# Patient Record
Sex: Male | Born: 1970 | Race: Black or African American | Hispanic: No | Marital: Married | Smoking: Former smoker
Health system: Southern US, Community
[De-identification: ages and names within clinical notes are randomized; demographics above are authoritative.]

## PROBLEM LIST (undated history)

## (undated) DIAGNOSIS — K589 Irritable bowel syndrome without diarrhea: Secondary | ICD-10-CM

## (undated) DIAGNOSIS — F419 Anxiety disorder, unspecified: Secondary | ICD-10-CM

## (undated) DIAGNOSIS — K760 Fatty (change of) liver, not elsewhere classified: Secondary | ICD-10-CM

## (undated) DIAGNOSIS — K8020 Calculus of gallbladder without cholecystitis without obstruction: Secondary | ICD-10-CM

## (undated) HISTORY — DX: Irritable bowel syndrome, unspecified: K58.9

## (undated) HISTORY — DX: Anxiety disorder, unspecified: F41.9

## (undated) HISTORY — DX: Fatty (change of) liver, not elsewhere classified: K76.0

## (undated) HISTORY — PX: OTHER SURGICAL HISTORY: SHX169

## (undated) HISTORY — DX: Calculus of gallbladder without cholecystitis without obstruction: K80.20

---

## 2000-06-21 ENCOUNTER — Emergency Department (HOSPITAL_COMMUNITY): Admission: EM | Admit: 2000-06-21 | Discharge: 2000-06-21 | Payer: Self-pay | Admitting: *Deleted

## 2001-01-09 ENCOUNTER — Emergency Department (HOSPITAL_COMMUNITY): Admission: EM | Admit: 2001-01-09 | Discharge: 2001-01-09 | Payer: Self-pay | Admitting: *Deleted

## 2002-03-02 ENCOUNTER — Emergency Department (HOSPITAL_COMMUNITY): Admission: EM | Admit: 2002-03-02 | Discharge: 2002-03-02 | Payer: Self-pay | Admitting: Emergency Medicine

## 2002-03-02 ENCOUNTER — Encounter: Payer: Self-pay | Admitting: Emergency Medicine

## 2002-03-08 ENCOUNTER — Emergency Department (HOSPITAL_COMMUNITY): Admission: EM | Admit: 2002-03-08 | Discharge: 2002-03-08 | Payer: Self-pay | Admitting: Emergency Medicine

## 2002-03-22 ENCOUNTER — Emergency Department (HOSPITAL_COMMUNITY): Admission: EM | Admit: 2002-03-22 | Discharge: 2002-03-22 | Payer: Self-pay | Admitting: Emergency Medicine

## 2002-04-06 ENCOUNTER — Emergency Department (HOSPITAL_COMMUNITY): Admission: EM | Admit: 2002-04-06 | Discharge: 2002-04-06 | Payer: Self-pay

## 2004-06-23 ENCOUNTER — Emergency Department (HOSPITAL_COMMUNITY): Admission: EM | Admit: 2004-06-23 | Discharge: 2004-06-23 | Payer: Self-pay | Admitting: Emergency Medicine

## 2006-06-11 ENCOUNTER — Emergency Department (HOSPITAL_COMMUNITY): Admission: EM | Admit: 2006-06-11 | Discharge: 2006-06-11 | Payer: Self-pay | Admitting: Emergency Medicine

## 2006-06-16 ENCOUNTER — Encounter: Admission: RE | Admit: 2006-06-16 | Discharge: 2006-06-16 | Payer: Self-pay | Admitting: Emergency Medicine

## 2006-08-21 ENCOUNTER — Emergency Department (HOSPITAL_COMMUNITY): Admission: EM | Admit: 2006-08-21 | Discharge: 2006-08-21 | Payer: Self-pay | Admitting: Emergency Medicine

## 2006-12-28 ENCOUNTER — Emergency Department (HOSPITAL_COMMUNITY): Admission: EM | Admit: 2006-12-28 | Discharge: 2006-12-28 | Payer: Self-pay | Admitting: Emergency Medicine

## 2007-01-24 ENCOUNTER — Emergency Department (HOSPITAL_COMMUNITY): Admission: EM | Admit: 2007-01-24 | Discharge: 2007-01-25 | Payer: Self-pay | Admitting: General Surgery

## 2008-07-17 ENCOUNTER — Emergency Department (HOSPITAL_COMMUNITY): Admission: EM | Admit: 2008-07-17 | Discharge: 2008-07-17 | Payer: Self-pay | Admitting: Emergency Medicine

## 2010-06-01 ENCOUNTER — Ambulatory Visit: Payer: Self-pay | Admitting: Rehabilitative and Restorative Service Providers"

## 2010-07-28 LAB — BASIC METABOLIC PANEL
BUN: 6 mg/dL (ref 6–23)
CO2: 29 mEq/L (ref 19–32)
Calcium: 9.5 mg/dL (ref 8.4–10.5)
Chloride: 103 mEq/L (ref 96–112)
Creatinine, Ser: 1.31 mg/dL (ref 0.4–1.5)
GFR calc Af Amer: >60 mL/min (ref >60)
GFR calc non Af Amer: >60 mL/min (ref >60)
Glucose, Bld: 93 mg/dL (ref 70–99)
Potassium: 4 mEq/L (ref 3.5–5.1)
Sodium: 139 mEq/L (ref 135–145)

## 2010-07-28 LAB — DIFFERENTIAL
Basophils Absolute: 0.1 10*3/uL (ref 0.0–0.1)
Basophils Relative: 1 % (ref 0–1)
Eosinophils Absolute: 0.2 10*3/uL (ref 0.0–0.7)
Eosinophils Relative: 2 % (ref 0–5)
Lymphocytes Relative: 17 % (ref 12–46)
Lymphs Abs: 1.4 10*3/uL (ref 0.7–4.0)
Monocytes Absolute: 0.4 10*3/uL (ref 0.1–1.0)
Monocytes Relative: 6 % (ref 3–12)
Neutro Abs: 5.9 10*3/uL (ref 1.7–7.7)
Neutrophils Relative %: 75 % (ref 43–77)

## 2010-07-28 LAB — CBC
HCT: 42.8 % (ref 39.0–52.0)
Hemoglobin: 14.5 g/dL (ref 13.0–17.0)
MCHC: 33.9 g/dL (ref 30.0–36.0)
MCV: 92.9 fL (ref 78.0–100.0)
Platelets: 179 10*3/uL (ref 150–400)
RBC: 4.61 MIL/uL (ref 4.22–5.81)
RDW: 13.1 % (ref 11.5–15.5)
WBC: 8 10*3/uL (ref 4.0–10.5)

## 2010-07-28 LAB — POCT CARDIAC MARKERS
CKMB, poc: <1 ng/mL — ABNORMAL LOW (ref 1.0–8.0)
Myoglobin, poc: 113 ng/mL (ref 12–200)
Troponin i, poc: <0.05 ng/mL (ref 0.00–0.09)

## 2010-08-28 ENCOUNTER — Inpatient Hospital Stay (INDEPENDENT_AMBULATORY_CARE_PROVIDER_SITE_OTHER)
Admission: RE | Admit: 2010-08-28 | Discharge: 2010-08-28 | Disposition: A | Discharge status: Home / Self Care | Payer: 59 | Source: Ambulatory Visit | Attending: Emergency Medicine | Admitting: Emergency Medicine

## 2010-08-28 DIAGNOSIS — L259 Unspecified contact dermatitis, unspecified cause: Secondary | ICD-10-CM

## 2011-01-27 LAB — I-STAT 8, (EC8 V) (CONVERTED LAB)
Acid-Base Excess: 3 — ABNORMAL HIGH
BUN: 8
Bicarbonate: 28.6 — ABNORMAL HIGH
Chloride: 102
Glucose, Bld: 85
HCT: 42
Hemoglobin: 14.3
Operator id: 279831
Potassium: 3.5
Sodium: 140
TCO2: 30
pCO2, Ven: 48.9
pH, Ven: 7.376 — ABNORMAL HIGH

## 2011-01-27 LAB — POCT CARDIAC MARKERS
CKMB, poc: <1 — ABNORMAL LOW
Myoglobin, poc: 148
Operator id: 279831
Troponin i, poc: <0.05

## 2011-01-27 LAB — POCT I-STAT CREATININE
Creatinine, Ser: 1.4
Operator id: 279831

## 2011-01-28 LAB — I-STAT 8, (EC8 V) (CONVERTED LAB)
Acid-Base Excess: 3 — ABNORMAL HIGH
BUN: 5 — ABNORMAL LOW
Bicarbonate: 29.4 — ABNORMAL HIGH
Chloride: 101
Glucose, Bld: 99
HCT: 47
Hemoglobin: 16
Operator id: 146091
Potassium: 3.3 — ABNORMAL LOW
Sodium: 139
TCO2: 31
pCO2, Ven: 51.8 — ABNORMAL HIGH
pH, Ven: 7.361 — ABNORMAL HIGH

## 2011-01-28 LAB — POCT CARDIAC MARKERS
CKMB, poc: <1 — ABNORMAL LOW
Myoglobin, poc: 97.5
Operator id: 146091
Troponin i, poc: <0.05

## 2011-01-28 LAB — RAPID URINE DRUG SCREEN, HOSP PERFORMED
Amphetamines: NOT DETECTED
Barbiturates: NOT DETECTED
Benzodiazepines: NOT DETECTED
Cocaine: NOT DETECTED
Opiates: NOT DETECTED
Tetrahydrocannabinol: NOT DETECTED

## 2011-01-28 LAB — POCT I-STAT CREATININE
Creatinine, Ser: 1.4
Operator id: 146091

## 2011-08-13 ENCOUNTER — Ambulatory Visit (INDEPENDENT_AMBULATORY_CARE_PROVIDER_SITE_OTHER): Payer: 59 | Admitting: Family Medicine

## 2011-08-13 VITALS — BP 116/74 | HR 81 | Temp 98.0°F | Resp 16 | Ht 73.0 in | Wt 214.4 lb

## 2011-08-13 DIAGNOSIS — R5381 Other malaise: Secondary | ICD-10-CM

## 2011-08-13 DIAGNOSIS — R0989 Other specified symptoms and signs involving the circulatory and respiratory systems: Secondary | ICD-10-CM

## 2011-08-13 DIAGNOSIS — F419 Anxiety disorder, unspecified: Secondary | ICD-10-CM

## 2011-08-13 DIAGNOSIS — F411 Generalized anxiety disorder: Secondary | ICD-10-CM

## 2011-08-13 DIAGNOSIS — R0609 Other forms of dyspnea: Secondary | ICD-10-CM

## 2011-08-13 DIAGNOSIS — R5383 Other fatigue: Secondary | ICD-10-CM

## 2011-08-13 DIAGNOSIS — R0683 Snoring: Secondary | ICD-10-CM

## 2011-08-13 NOTE — Progress Notes (Signed)
[facility]:  Office Visit  Chief Complaint:  Chief Complaint  Patient presents with  . Rx refill  . Body/joint aches    x on/off 2 wks    HPI: Micheal Roberts is a 41 y.o. male who complains of  Generalized fatigue and body aches, jt pain. ? RA in family. 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 Poor sleep hygeine, sleeps about 3-4 hrs after coming home from work. Snores at night. Tried ibuprofen with some relief for msk pain.   Past Medical History  Diagnosis Date  . IBS (irritable bowel syndrome)    Past Surgical History  Procedure Date  . Left acl arthroscopy    History   Social History  . Marital Status: Single    Spouse Name: N/A    Number of Children: N/A  . Years of Education: N/A   Social History Main Topics  . Smoking status: Current Everyday Smoker -- 0.5 packs/day for 10 years    Types: Cigarettes  . Smokeless tobacco: None  . Alcohol Use: Yes     beer - only on ocassions  . Drug Use: No  . Sexually Active: Yes   Other Topics Concern  . None   Social History Narrative  . None   Family History  Problem Relation Age of Onset  . Hypertension Mother   . Anemia Mother   . Thyroid disease Father    No Known Allergies Prior to Admission medications   Medication Sig Start Date End Date Taking? Authorizing Provider  clonazePAM (KLONOPIN) 0.5 MG tablet Take 0.5 mg by mouth 2 (two) times daily as needed.   Yes Historical Provider, MD     ROS: The patient denies fevers, chills, night sweats, unintentional weight loss, chest pain, palpitations, wheezing, dyspnea on exertion, nausea, vomiting, abdominal pain, dysuria, hematuria, melena, numbness, or tingling. + joint and msk pain  All other systems have been reviewed and were otherwise negative with the exception of those mentioned in the HPI and as above.    PHYSICAL EXAM: Filed Vitals:   08/13/11 1518  BP: 116/74  Pulse: 81  Temp: 98 F (36.7 C)  Resp: 16   Filed Vitals:    08/13/11 1518  Height: 6\' 1"  (1.854 m)  Weight: 214 lb 6.4 oz (97.251 kg)   Body mass index is 28.29 kg/(m^2).  General: Alert, no acute distress HEENT:  Normocephalic, atraumatic, oropharynx patent.  Cardiovascular:  Regular rate and rhythm, no rubs murmurs or gallops.  No Carotid bruits, radial pulse intact. No pedal edema.  Respiratory: Clear to auscultation bilaterally.  No wheezes, rales, or rhonchi.  No cyanosis, no use of accessory musculature GI: No organomegaly, abdomen is soft and non-tender, positive bowel sounds.  No masses. Skin: No rashes. Neurologic: Facial musculature symmetric. Psychiatric: Patient is appropriate throughout our interaction. Lymphatic: No cervical lymphadenopathy Musculoskeletal: Gait intact.   LABS:    EKG/XRAY:   Primary read interpreted by Dr. Conley Rolls at [facility].   ASSESSMENT/PLAN: Encounter Diagnoses  Name Primary?  . Fatigue Yes  . Anxiety   . Snoring    Very vague office visit. Marland Kitchen He does seem to get relief for his jt and msk aches and pain with motrin. Since he has a? History of RA in the family I will get a RF. Additionally TSH for fatigue. I think it is primarily secondary to poor sleep hygiene. Patient is not able to go or maintain sleep and ? Poor sleep hygeine, anxiety or something  else likse sleep apnea. He does have + snoring, depression/anxiety sxs, diurnal fatigue, etc.   1. RF, TSH and sleep study referral.  2. Refilled Clonazepam 0.5 mg daily #30, NO refills.  3. F/u with Dr Milus Glazier for annual.    [facility]al [facility], DO 08/13/2011 4:14 PM

## 2011-08-14 LAB — RHEUMATOID FACTOR: Rheumatoid fact SerPl-aCnc: <10 [IU]/mL (ref <=14)

## 2011-08-14 LAB — TSH: TSH: 1.084 u[IU]/mL (ref 0.350–4.500)

## 2011-08-22 ENCOUNTER — Encounter: Payer: Self-pay | Admitting: Family Medicine

## 2011-09-12 ENCOUNTER — Ambulatory Visit (HOSPITAL_BASED_OUTPATIENT_CLINIC_OR_DEPARTMENT_OTHER): Payer: 59

## 2011-09-13 ENCOUNTER — Other Ambulatory Visit: Payer: Self-pay | Admitting: Family Medicine

## 2011-09-14 ENCOUNTER — Ambulatory Visit (INDEPENDENT_AMBULATORY_CARE_PROVIDER_SITE_OTHER): Payer: 59 | Admitting: Family Medicine

## 2011-09-14 VITALS — BP 127/76 | HR 82 | Temp 98.8°F | Resp 20 | Ht 73.0 in | Wt 212.0 lb

## 2011-09-14 DIAGNOSIS — F411 Generalized anxiety disorder: Secondary | ICD-10-CM

## 2011-09-14 DIAGNOSIS — F419 Anxiety disorder, unspecified: Secondary | ICD-10-CM

## 2011-09-14 MED ORDER — BUSPIRONE HCL 15 MG PO TABS: 7.5000 mg | ORAL_TABLET | Freq: Two times a day (BID) | ORAL | Status: AC

## 2011-09-14 MED ORDER — CLONAZEPAM 0.5 MG PO TABS: 0.5000 mg | ORAL_TABLET | Freq: Every day | ORAL | Status: DC | PRN

## 2011-09-14 NOTE — Patient Instructions (Signed)

## 2011-09-14 NOTE — Progress Notes (Signed)
  Subjective:    Patient ID: Micheal Roberts, male    DOB: June 11, 1970, 41 y.o.   MRN: [id number]  HPI Pt here for follow up on anxiety.  Currently on klonopin 0.5 1 tab daily Has tried multiple SSRIs in the past including:  Prozac, zoloft, celexa, lexapro, wellbutrin Multiple intolerances--> did not make pt feel right, side effects intolerable.  Has not tried buspar before.  Is willing to try this medication.  No HI/SI.    Review of Systems See HPI, otherwise ROS negative.    Objective:   Physical Exam Gen: up in chair, NAD HEENT: NCAT, EOMI, TMs clear bilaterally CV: RRR, no murmurs auscultated PULM: CTAB, no wheezes, rales, rhoncii ABD: S/NT/+ bowel sounds  EXT: 2+ peripheral pulses   Assessment & Plan:   Will start on trial of buspar. Continue klonopin. Follow up in 1 month. Psych red flags reviewed.

## 2011-10-19 ENCOUNTER — Ambulatory Visit (INDEPENDENT_AMBULATORY_CARE_PROVIDER_SITE_OTHER): Payer: 59 | Admitting: Family Medicine

## 2011-10-19 VITALS — BP 104/76 | HR 69 | Temp 98.4°F | Resp 16 | Ht 73.0 in | Wt 211.4 lb

## 2011-10-19 DIAGNOSIS — M79659 Pain in unspecified thigh: Secondary | ICD-10-CM

## 2011-10-19 DIAGNOSIS — F411 Generalized anxiety disorder: Secondary | ICD-10-CM

## 2011-10-19 DIAGNOSIS — M79609 Pain in unspecified limb: Secondary | ICD-10-CM

## 2011-10-19 MED ORDER — PREDNISONE 20 MG PO TABS: ORAL_TABLET | ORAL | Status: DC

## 2011-10-19 MED ORDER — CLONAZEPAM 0.5 MG PO TABS: 0.5000 mg | ORAL_TABLET | Freq: Two times a day (BID) | ORAL | Status: DC | PRN

## 2011-10-19 NOTE — Progress Notes (Addendum)
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 and developed right thigh pain 3 days ago.  It remains sore in the mid anterior thigh.  No recent change in activity or trauma.  He also has some low back pain and has had similar mild pain in the past in each of the thighs.  No weakness or loss of feeling  O:  NAD FROM Neg SLR but pain is reproduced when doing a femoral stretch  A:  Suspect a mild L3 disc swelling with nerve root irritation  P:  Prednisone 40 mg daily for 5 days. 1. Thigh pain  predniSONE (DELTASONE) 20 MG tablet   Also needs refill on the clonazepam. Buspar does not work for him  Plan:  Refill the clonazepam. 0.5 bid

## 2011-10-19 NOTE — Addendum Note (Signed)
Addended by: Elvina Sidle on: 10/19/2011 03:21 PM   Modules accepted: Orders, Level of Service

## 2012-05-05 ENCOUNTER — Ambulatory Visit (INDEPENDENT_AMBULATORY_CARE_PROVIDER_SITE_OTHER): Payer: 59 | Admitting: Family Medicine

## 2012-05-05 VITALS — BP 119/77 | HR 75 | Temp 98.3°F | Resp 12 | Ht 75.0 in | Wt 217.0 lb

## 2012-05-05 DIAGNOSIS — F419 Anxiety disorder, unspecified: Secondary | ICD-10-CM

## 2012-05-05 DIAGNOSIS — F411 Generalized anxiety disorder: Secondary | ICD-10-CM

## 2012-05-05 DIAGNOSIS — G4700 Insomnia, unspecified: Secondary | ICD-10-CM

## 2012-05-05 MED ORDER — CLONAZEPAM 0.5 MG PO TABS: 0.5000 mg | ORAL_TABLET | Freq: Two times a day (BID) | ORAL | Status: DC | PRN

## 2012-05-05 NOTE — Patient Instructions (Signed)

## 2012-05-05 NOTE — Progress Notes (Signed)
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 He's here for refill on his Klonopin. He reports it is not sleeping well. Nevertheless his anxiety is being controlled. He reports no unnecessary somnolence.  Objective: HEENT: Unremarkable No acute distress Chest: Clear Heart: Regular no murmur Extremities: Unremarkable  Assessment: Patient with chronic anxiety who seems to be doing reasonably well and dysfunctional. Am reluctant to give him more medicine to take at night for sleep for fear of drug interactions   1. Anxiety  clonazePAM (KLONOPIN) 0.5 MG tablet  2. Insomnia

## 2012-09-14 ENCOUNTER — Ambulatory Visit (INDEPENDENT_AMBULATORY_CARE_PROVIDER_SITE_OTHER): Payer: 59 | Admitting: Emergency Medicine

## 2012-09-14 ENCOUNTER — Ambulatory Visit: Payer: 59

## 2012-09-14 VITALS — BP 104/58 | HR 72 | Temp 98.3°F | Resp 16 | Ht 74.0 in | Wt 221.0 lb

## 2012-09-14 DIAGNOSIS — M25569 Pain in unspecified knee: Secondary | ICD-10-CM

## 2012-09-14 DIAGNOSIS — M25562 Pain in left knee: Secondary | ICD-10-CM

## 2012-09-14 DIAGNOSIS — M109 Gout, unspecified: Secondary | ICD-10-CM

## 2012-09-14 MED ORDER — COLCHICINE 0.6 MG PO TABS: ORAL_TABLET | ORAL | Status: DC

## 2012-09-14 MED ORDER — INDOMETHACIN 25 MG PO CAPS: 25.0000 mg | ORAL_CAPSULE | Freq: Two times a day (BID) | ORAL | Status: DC

## 2012-09-14 NOTE — Patient Instructions (Addendum)
Gout  Gout is an inflammatory condition (arthritis) caused by a buildup of uric acid crystals in the joints. Uric acid is a chemical that is normally present in the blood. Under some circumstances, uric acid can form into crystals in your joints. This causes joint redness, soreness, and swelling (inflammation). Repeat attacks are common. Over time, uric acid crystals can form into masses (tophi) near a joint, causing disfigurement. Gout is treatable and often preventable.  CAUSES   The disease begins with elevated levels of uric acid in the blood. Uric acid is produced by your body when it breaks down a naturally found substance called purines. This also happens when you eat certain foods such as meats and fish. Causes of an elevated uric acid level include:   Being passed down from parent to child (heredity).   Diseases that cause increased uric acid production (obesity, psoriasis, some cancers).   Excessive alcohol use.   Diet, especially diets rich in meat and seafood.   Medicines, including certain cancer-fighting drugs (chemotherapy), diuretics, and aspirin.   Chronic kidney disease. The kidneys are no longer able to remove uric acid well.   Problems with metabolism.  Conditions strongly associated with gout include:   Obesity.   High blood pressure.   High cholesterol.   Diabetes.  Not everyone with elevated uric acid levels gets gout. It is not understood why some people get gout and others do not. Surgery, joint injury, and eating too much of certain foods are some of the factors that can lead to gout.  SYMPTOMS    An attack of gout comes on quickly. It causes intense pain with redness, swelling, and warmth in a joint.   Fever can occur.   Often, only one joint is involved. Certain joints are more commonly involved:   Base of the big toe.   Knee.   Ankle.   Wrist.   Finger.  Without treatment, an attack usually goes away in a few days to weeks. Between attacks, you usually will not have  symptoms, which is different from many other forms of arthritis.  DIAGNOSIS   Your caregiver will suspect gout based on your symptoms and exam. Removal of fluid from the joint (arthrocentesis) is done to check for uric acid crystals. Your caregiver will give you a medicine that numbs the area (local anesthetic) and use a needle to remove joint fluid for exam. Gout is confirmed when uric acid crystals are seen in joint fluid, using a special microscope. Sometimes, blood, urine, and X-ray tests are also used.  TREATMENT   There are 2 phases to gout treatment: treating the sudden onset (acute) attack and preventing attacks (prophylaxis).  Treatment of an Acute Attack   Medicines are used. These include anti-inflammatory medicines or steroid medicines.   An injection of steroid medicine into the affected joint is sometimes necessary.   The painful joint is rested. Movement can worsen the arthritis.   You may use warm or cold treatments on painful joints, depending which works best for you.   Discuss the use of coffee, vitamin C, or cherries with your caregiver. These may be helpful treatment options.  Treatment to Prevent Attacks  After the acute attack subsides, your caregiver may advise prophylactic medicine. These medicines either help your kidneys eliminate uric acid from your body or decrease your uric acid production. You may need to stay on these medicines for a very long time.  The early phase of treatment with prophylactic medicine can be associated   with an increase in acute gout attacks. For this reason, during the first few months of treatment, your caregiver may also advise you to take medicines usually used for acute gout treatment. Be sure you understand your caregiver's directions.  You should also discuss dietary treatment with your caregiver. Certain foods such as meats and fish can increase uric acid levels. Other foods such as dairy can decrease levels. Your caregiver can give you a list of foods  to avoid.  HOME CARE INSTRUCTIONS    Do not take aspirin to relieve pain. This raises uric acid levels.   Only take over-the-counter or prescription medicines for pain, discomfort, or fever as directed by your caregiver.   Rest the joint as much as possible. When in bed, keep sheets and blankets off painful areas.   Keep the affected joint raised (elevated).   Use crutches if the painful joint is in your leg.   Drink enough water and fluids to keep your urine clear or pale yellow. This helps your body get rid of uric acid. Do not drink alcoholic beverages. They slow the passage of uric acid.   Follow your caregiver's dietary instructions. Pay careful attention to the amount of protein you eat. Your daily diet should emphasize fruits, vegetables, whole grains, and fat-free or low-fat milk products.   Maintain a healthy body weight.  SEEK MEDICAL CARE IF:    You have an oral temperature above 102 F (38.9 C).   You develop diarrhea, vomiting, or any side effects from medicines.   You do not feel better in 24 hours, or you are getting worse.  SEEK IMMEDIATE MEDICAL CARE IF:    Your joint becomes suddenly more tender and you have:   Chills.   An oral temperature above 102 F (38.9 C), not controlled by medicine.  MAKE SURE YOU:    Understand these instructions.   Will watch your condition.   Will get help right away if you are not doing well or get worse.  Document Released: 04/01/2000 Document Revised: 06/27/2011 Document Reviewed: 07/13/2009  Exit[employer] Patient Information 2014 [employer].

## 2012-09-14 NOTE — Progress Notes (Signed)
[facility] [address] [place] [address] [phone]  Date:  09/14/2012   Name:  Micheal Roberts   DOB:  Nov 20, 1970   MRN:  [id number]  PCP:  Tally Due, MD    Chief Complaint: Knee Pain   History of Present Illness:  Micheal Roberts is a 42 y.o. very pleasant male patient who presents with the following:  No history of injury.  Has pain in left knee for past two weeks.  Swollen and painful with worse pain with ambulation.  No fever or chills.  No antecedent injury or illness.  Injured playing sports while in high school.  No improvement with over the counter medications or other home remedies. Denies other complaint or health concern today.   There are no active problems to display for this patient.   Past Medical History  Diagnosis Date  . IBS (irritable bowel syndrome)   . Anxiety     Past Surgical History  Procedure Laterality Date  . Left acl arthroscopy      History  Substance Use Topics  . Smoking status: Current Every Day Smoker -- 0.50 packs/day for 10 years    Types: Cigarettes  . Smokeless tobacco: Not on file  . Alcohol Use: Yes     Comment: beer - only on ocassions    Family History  Problem Relation Age of Onset  . Hypertension Mother   . Anemia Mother   . Thyroid disease Father     No Known Allergies  Medication list has been reviewed and updated.  Current Outpatient Prescriptions on File Prior to Visit  Medication Sig Dispense Refill  . clonazePAM (KLONOPIN) 0.5 MG tablet Take 1 tablet (0.5 mg total) by mouth 2 (two) times daily as needed for anxiety. Need office visit for additional refills.  60 tablet  5  . Multiple Vitamin (MULTIVITAMIN) tablet Take 1 tablet by mouth daily.       No current facility-administered medications on file prior to visit.    Review of Systems:  As per HPI, otherwise negative.    Physical Examination: Filed Vitals:   09/14/12 1304  BP: 104/58  Pulse: 72  Temp:  98.3 F (36.8 C)  Resp: 16   Filed Vitals:   09/14/12 1304  Height: 6\' 2"  (1.88 m)  Weight: 221 lb (100.245 kg)   Body mass index is 28.36 kg/(m^2). Ideal Body Weight: Weight in (lb) to have BMI = 25: 194.3   GEN: WDWN, NAD, Non-toxic, Alert & Oriented x 3 HEENT: Atraumatic, Normocephalic.  Ears and Nose: No external deformity. EXTR: No clubbing/cyanosis/edema NEURO: Normal gait.  PSYCH: Normally interactive. Conversant. Not depressed or anxious appearing.  Calm demeanor.  LEFT KNEE:  Swollen and tender with FAROM.  No crepitus, clicks or locking.  Assessment and Plan: Gout arthritis Indocin colcrys  Follow up as needed  Signed,  Phillips Odor, MD   [facility] reading (PRIMARY) by  Dr. Dareen Piano  Negative knee.

## 2012-09-15 NOTE — Progress Notes (Signed)
Reviewed and agree.

## 2012-11-11 ENCOUNTER — Other Ambulatory Visit: Payer: Self-pay | Admitting: Family Medicine

## 2012-11-12 NOTE — Telephone Encounter (Signed)
Needs OV for more Klonopin - please let pt know about our controlled substance policy.

## 2012-11-12 NOTE — Telephone Encounter (Signed)
Dr L, do you want to RF this or see pt back first?

## 2012-11-13 NOTE — Telephone Encounter (Signed)
LMOM w/details concerning controlled subst policy.

## 2012-12-24 ENCOUNTER — Ambulatory Visit: Payer: 59

## 2012-12-24 ENCOUNTER — Telehealth: Payer: Self-pay | Admitting: Radiology

## 2012-12-24 NOTE — Telephone Encounter (Signed)
Ok to refill clonazepam x 1 month

## 2012-12-24 NOTE — Telephone Encounter (Signed)
Patient came in to be seen but was having trouble with his insurance, company states it is termed. Patient wants to know if you will refill his meds (klonopin) until he can get his insurance situation figured out. Please advise.

## 2012-12-25 ENCOUNTER — Other Ambulatory Visit: Payer: Self-pay

## 2012-12-25 DIAGNOSIS — F419 Anxiety disorder, unspecified: Secondary | ICD-10-CM

## 2012-12-25 MED ORDER — CLONAZEPAM 0.5 MG PO TABS: 0.5000 mg | ORAL_TABLET | Freq: Two times a day (BID) | ORAL | Status: DC | PRN

## 2012-12-25 NOTE — Telephone Encounter (Signed)
Called in RX to [facility]. Called pt to let him know. Pt understood.

## 2013-03-11 ENCOUNTER — Telehealth: Payer: Self-pay

## 2013-03-11 NOTE — Telephone Encounter (Signed)
Patient needs follow up for refills, called to advise.

## 2013-03-11 NOTE — Telephone Encounter (Signed)
Patient would like a refill on his clonazepam please call patient at [phone]

## 2013-06-04 ENCOUNTER — Ambulatory Visit (INDEPENDENT_AMBULATORY_CARE_PROVIDER_SITE_OTHER): Payer: BC Managed Care – PPO | Admitting: Family Medicine

## 2013-06-04 ENCOUNTER — Encounter: Payer: Self-pay | Admitting: Family Medicine

## 2013-06-04 VITALS — BP 154/97 | HR 70 | Temp 98.2°F | Resp 16 | Ht 72.5 in | Wt 207.0 lb

## 2013-06-04 DIAGNOSIS — E049 Nontoxic goiter, unspecified: Secondary | ICD-10-CM

## 2013-06-04 DIAGNOSIS — M542 Cervicalgia: Secondary | ICD-10-CM

## 2013-06-04 DIAGNOSIS — Z8349 Family history of other endocrine, nutritional and metabolic diseases: Secondary | ICD-10-CM

## 2013-06-04 DIAGNOSIS — F419 Anxiety disorder, unspecified: Secondary | ICD-10-CM

## 2013-06-04 DIAGNOSIS — E010 Iodine-deficiency related diffuse (endemic) goiter: Secondary | ICD-10-CM

## 2013-06-04 DIAGNOSIS — F411 Generalized anxiety disorder: Secondary | ICD-10-CM

## 2013-06-04 DIAGNOSIS — R002 Palpitations: Secondary | ICD-10-CM

## 2013-06-04 MED ORDER — CLONAZEPAM 0.5 MG PO TABS: 0.5000 mg | ORAL_TABLET | Freq: Two times a day (BID) | ORAL | Status: DC | PRN

## 2013-06-04 NOTE — Progress Notes (Addendum)
Subjective:    Patient ID: Micheal Roberts, male    DOB: June 25, 1970, 43 y.o.   MRN: [id number] This chart was scribed for Merri Ray, MD by Anastasia Pall, ED Scribe. This patient was seen in room 13 and the patient's care was started at 4:24 PM.  Authored by Janeann Forehand, MD - unable to change in [facility]   Chief Complaint  Patient presents with   Neck Pain    since last year   Anxiety    needs refills on Klonopin 0.5   Spasms    x 1 week   HPI Micheal Roberts is a 43 y.o. male Here for neck pain, anxiety and muscle spasms with refills needed for Klonopin 0.5. This was prescribed by Dr. Joseph Art 05/07/12 with # 60 and 5 refills. Per phone note 12/24/12 Klonopin was written for 1 month. Then again phon note 11/14 advised to follow up for refills. Per last office visit trouble sleeping. No change in doses of medicines at that time.   He states his PCP is Dr. Joseph Art. He has scheduled appointment for physical exam 07/28/13.  Pt reports intermittent, frontal neck pain, onset 1 year ago, constantly now for the last 2 months. He denies trauma, injury to the area. He denies sore throat, pain with swallowing, pain with movement. He denies any bumps, enlarged nodes. He denies pain over the back of his neck. He denies fevers, unexpected weight loss, night sweats, chills. He reports his father has h/o hyperthyroidism, took radiation pill, now has hypothyroidism, takes medication daily. He reports his father had nodules, but denies him having cancer. Pt reports having blood work done 08/13/11 normal TSH 1.08.   He reports intermittent tremors over his 3rd and 4th digits, onset 1 week ago, jittery. He reports getting more on edge recently than usual, but denies any specific thoughts bringing on his anxiety. He denies having taken any medications since Klonopin he had been taking prior. Pt's anxiety has increased since running out of Klonopin. He reports seeing counselor last in 2009. He reports  losing his daughter in 2005, h/o anxiety ever since then. Spouse reports pt having anxiety attack, 4 days ago, while laying down. Pt denies thinking about anything specific at that time. He reports having attacks once a month. He states he gets SOB, has palpitations, anxious. He also reports mini attacks every day, that last about 15 minutes after talking himself through it. He reports he still has trouble falling asleep due to anxiety, and mind not able to stop thinking.Marland Kitchen He reports working at [employer], used to work at [employer]. He denies work being more stressful than usual. He states his BP is not usually elevated.   He states when he was taking Klonopin he took it as needed. Usually half a pill, almost every day in the afternoon. He reports he usually felt more anxious at midday. He denies any ill side effects from Klonopin. He denies having success with Prozac. He reports taking it for 1 week, feeling "different". He is resistant to being referred to counselor at this time.   He reports occasional, but rare EtOH use. He denies h/o smoking, drug use.   There are no active problems to display for this patient.  Past Medical History  Diagnosis Date   IBS (irritable bowel syndrome)    Anxiety    Past Surgical History  Procedure Laterality Date   Left acl arthroscopy     No Known Allergies Prior to Admission medications  Medication Sig Start Date End Date Taking? Authorizing Provider  indomethacin (INDOCIN) 25 MG capsule Take 1 capsule (25 mg total) by mouth 2 (two) times daily with a meal. 09/14/12  Yes Ellison Carwin, MD  Multiple Vitamin (MULTIVITAMIN) tablet Take 1 tablet by mouth daily.   Yes Historical Provider, MD  clonazePAM (KLONOPIN) 0.5 MG tablet Take 1 tablet (0.5 mg total) by mouth 2 (two) times daily as needed for anxiety. Need office visit for additional refills. 12/25/12   Robyn Haber, MD  colchicine 0.6 MG tablet 2 now and one in one hour then one daily  09/14/12   Ellison Carwin, MD   Review of Systems  Constitutional: Negative for fever, chills and diaphoresis.  HENT: Negative for sore throat and trouble swallowing.   Respiratory: Positive for shortness of breath.   Cardiovascular: Positive for palpitations.  Musculoskeletal: Positive for neck pain (frontal). Negative for neck stiffness.  Neurological: Positive for tremors (left 3rd and 4th digits, intermittently).  Psychiatric/Behavioral: Positive for sleep disturbance. Negative for dysphoric mood. The patient is nervous/anxious.       Objective:   Physical Exam  Nursing note and vitals reviewed. Constitutional: He is oriented to person, place, and time. He appears well-developed and well-nourished. No distress.  HENT:  Head: Normocephalic and atraumatic.  Right Ear: External ear normal.  Left Ear: External ear normal.  Nose: Nose normal.  Mouth/Throat: Oropharynx is clear and moist. No oropharyngeal exudate.  Eyes: Conjunctivae and EOM are normal.  Neck: Neck supple. Thyromegaly (prominent thyroid without nodules) present.  Prominence of thyroid tissue bilaterally.  Cardiovascular: Normal rate, regular rhythm and normal heart sounds.   No murmur heard. Pulmonary/Chest: Effort normal and breath sounds normal. No respiratory distress. He has no wheezes. He has no rales.  Musculoskeletal: Normal range of motion. He exhibits no edema and no tenderness.  Lymphadenopathy:    He has no cervical adenopathy.  Neurological: He is alert and oriented to person, place, and time. He has normal strength. No cranial nerve deficit or sensory deficit. He exhibits normal muscle tone. He displays a negative Romberg sign. Coordination and gait normal.  Neurovascular intact. Negative pronator, negative heel to toe bilaterally, negative finger to nose bilaterally, heel to shin bilaterally.   Skin: Skin is warm and dry.  Psychiatric: He has a normal mood and affect. His behavior is normal.   BP  154/97   Pulse 70   Temp(Src) 98.2 F (36.8 C)   Resp 16   Ht 6' 0.5" (1.842 m)   Wt 207 lb (93.895 kg)   BMI 27.67 kg/m2   SpO2 98%     Assessment & Plan:    Micheal Roberts is a 43 y.o. male Anxiety , GAD (generalized anxiety disorder) - Plan: clonazePAM (KLONOPIN) 0.5 MG tablet refilled, but discussed book Anxiety for Dummies as below, declined referral to psychologist/counselling. Also discussed SSRi, but can try coping techniques first, intermittent klonopin if needed, and recheck to discuss further in 3-4 weeks to see if changes needed.   Anterior neck pain, Thyromegaly, FH: thyroid disease - Plan: TSH, US Soft Tissue Head/Neck, T4, Free  Palpitations - with panic/anxiety sx's, but will check tsh and follow up to discuss further. Rtc/er precautions.   Meds ordered this encounter  Medications   clonazePAM (KLONOPIN) 0.5 MG tablet    Sig: Take 1-2 tablets (0.5-1 mg total) by mouth 2 (two) times daily as needed for anxiety. Need office visit for additional refills.    Dispense:  60 tablet  Refill:  0   Patient Instructions  Restarted klonopin if needed, pick up the book Anxiety For Dummies and work on the coping techniques and stress management techniques as we discussed. You should receive a call or letter about your lab results within the next week to 10 days. And I will let you know once I receive your thyroid ultrasound results. Follow up in the next 3-4 weeks to discuss this further. Can hold off on medicine like prozac for now, but this may be helpful for your symptoms, especially if frequent use of Klonopin.  Return to the clinic or go to the nearest emergency room if any of your symptoms worsen or new symptoms occur. Keep follow up with Dr. Joseph Art for physical.   [place] for Prescribing Controlled Substances (Revised 02/2012) 1. Prescriptions for controlled substances will be filled by ONE provider at [facility] with whom you have established and developed a plan for your  care, including follow-up. 2. You are encouraged to schedule an appointment with your prescriber at our appointment center for follow-up visits whenever possible. 3. If you request a prescription for the controlled substance while at [facility] for an acute problem (with someone other than your regular prescriber), you MAY be given a ONE-TIME prescription for a 30-day supply of the controlled substance, to allow time for you to return to see your regular prescriber for additional prescriptions.  Generalized Anxiety Disorder Generalized anxiety disorder (GAD) is a mental disorder. It interferes with life functions, including relationships, work, and school. GAD is different from normal anxiety, which everyone experiences at some point in their lives in response to specific life events and activities. Normal anxiety actually helps [place] prepare for and get through these life events and activities. Normal anxiety goes away after the event or activity is over.  GAD causes anxiety that is not necessarily related to specific events or activities. It also causes excess anxiety in proportion to specific events or activities. The anxiety associated with GAD is also difficult to control. GAD can vary from mild to severe. People with severe GAD can have intense waves of anxiety with physical symptoms (panic attacks).  SYMPTOMS The anxiety and worry associated with GAD are difficult to control. This anxiety and worry are related to many life events and activities and also occur more days than not for 6 months or longer. People with GAD also have three or more of the following symptoms (one or more in children):  Restlessness.   Fatigue.  Difficulty concentrating.   Irritability.  Muscle tension.  Difficulty sleeping or unsatisfying sleep. DIAGNOSIS GAD is diagnosed through an assessment by your caregiver. Your caregiver will ask you questions aboutyour mood,physical symptoms, and events in your life. Your  caregiver may ask you about your medical history and use of alcohol or drugs, including prescription medications. Your caregiver may also do a physical exam and blood tests. Certain medical conditions and the use of certain substances can cause symptoms similar to those associated with GAD. Your caregiver may refer you to a mental health specialist for further evaluation. TREATMENT The following therapies are usually used to treat GAD:   Medication Antidepressant medication usually is prescribed for long-term daily control. Antianxiety medications may be added in severe cases, especially when panic attacks occur.   Talk therapy (psychotherapy) Certain types of talk therapy can be helpful in treating GAD by providing support, education, and guidance. A form of talk therapy called cognitive behavioral therapy can teach you healthy ways to think  about and react to daily life events and activities.  Stress managementtechniques These include yoga, meditation, and exercise and can be very helpful when they are practiced regularly. A mental health specialist can help determine which treatment is best for you. Some people see improvement with one therapy. However, other people require a combination of therapies. Document Released: 07/30/2012 Document Reviewed: 07/30/2012 [facility] Patient Information 2014 [place], [place].     I personally performed the services described in this documentation, which was scribed in my presence. The recorded information has been reviewed and considered, and addended by me as needed.

## 2013-06-04 NOTE — Patient Instructions (Addendum)
Restarted klonopin if needed, pick up the book Anxiety For Dummies and work on the coping techniques and stress management techniques as we discussed. You should receive a call or letter about your lab results within the next week to 10 days. And I will let you know once I receive your thyroid ultrasound results. Follow up in the next 3-4 weeks to discuss this further. Can hold off on medicine like prozac for now, but this may be helpful for your symptoms, especially if frequent use of Klonopin.  Return to the clinic or go to the nearest emergency room if any of your symptoms worsen or new symptoms occur. Keep follow up with Dr. Joseph Art for physical.   [place] for Prescribing Controlled Substances (Revised 02/2012) 1. Prescriptions for controlled substances will be filled by ONE provider at [facility] with whom you have established and developed a plan for your care, including follow-up. 2. You are encouraged to schedule an appointment with your prescriber at our appointment center for follow-up visits whenever possible. 3. If you request a prescription for the controlled substance while at [facility] for an acute problem (with someone other than your regular prescriber), you MAY be given a ONE-TIME prescription for a 30-day supply of the controlled substance, to allow time for you to return to see your regular prescriber for additional prescriptions.  Generalized Anxiety Disorder Generalized anxiety disorder (GAD) is a mental disorder. It interferes with life functions, including relationships, work, and school. GAD is different from normal anxiety, which everyone experiences at some point in their lives in response to specific life events and activities. Normal anxiety actually helps [place] prepare for and get through these life events and activities. Normal anxiety goes away after the event or activity is over.  GAD causes anxiety that is not necessarily related to specific events or activities. It also causes  excess anxiety in proportion to specific events or activities. The anxiety associated with GAD is also difficult to control. GAD can vary from mild to severe. People with severe GAD can have intense waves of anxiety with physical symptoms (panic attacks).  SYMPTOMS The anxiety and worry associated with GAD are difficult to control. This anxiety and worry are related to many life events and activities and also occur more days than not for 6 months or longer. People with GAD also have three or more of the following symptoms (one or more in children):  Restlessness.   Fatigue.  Difficulty concentrating.   Irritability.  Muscle tension.  Difficulty sleeping or unsatisfying sleep. DIAGNOSIS GAD is diagnosed through an assessment by your caregiver. Your caregiver will ask you questions aboutyour mood,physical symptoms, and events in your life. Your caregiver may ask you about your medical history and use of alcohol or drugs, including prescription medications. Your caregiver may also do a physical exam and blood tests. Certain medical conditions and the use of certain substances can cause symptoms similar to those associated with GAD. Your caregiver may refer you to a mental health specialist for further evaluation. TREATMENT The following therapies are usually used to treat GAD:   Medication Antidepressant medication usually is prescribed for long-term daily control. Antianxiety medications may be added in severe cases, especially when panic attacks occur.   Talk therapy (psychotherapy) Certain types of talk therapy can be helpful in treating GAD by providing support, education, and guidance. A form of talk therapy called cognitive behavioral therapy can teach you healthy ways to think about and react to daily life events and activities.  Stress managementtechniques These include yoga, meditation, and exercise and can be very helpful when they are practiced regularly. A mental health  specialist can help determine which treatment is best for you. Some people see improvement with one therapy. However, other people require a combination of therapies. Document Released: 07/30/2012 Document Reviewed: 07/30/2012 [facility] Patient Information 2014 [place], [place].

## 2013-06-05 LAB — T4, FREE: Free T4: 1.1 ng/dL (ref 0.80–1.80)

## 2013-06-05 LAB — TSH: TSH: 1.695 u[IU]/mL (ref 0.350–4.500)

## 2013-06-10 ENCOUNTER — Encounter: Payer: Self-pay | Admitting: *Deleted

## 2013-06-14 ENCOUNTER — Other Ambulatory Visit: Payer: 59

## 2013-07-25 ENCOUNTER — Ambulatory Visit (INDEPENDENT_AMBULATORY_CARE_PROVIDER_SITE_OTHER): Payer: BC Managed Care – PPO | Admitting: Family Medicine

## 2013-07-25 ENCOUNTER — Encounter: Payer: Self-pay | Admitting: Family Medicine

## 2013-07-25 VITALS — BP 118/82 | HR 75 | Temp 98.1°F | Resp 16 | Ht 73.5 in | Wt 207.6 lb

## 2013-07-25 DIAGNOSIS — R220 Localized swelling, mass and lump, head: Secondary | ICD-10-CM

## 2013-07-25 DIAGNOSIS — F419 Anxiety disorder, unspecified: Secondary | ICD-10-CM

## 2013-07-25 DIAGNOSIS — F411 Generalized anxiety disorder: Secondary | ICD-10-CM

## 2013-07-25 DIAGNOSIS — R221 Localized swelling, mass and lump, neck: Secondary | ICD-10-CM

## 2013-07-25 DIAGNOSIS — Z0000 Encounter for general adult medical examination without abnormal findings: Secondary | ICD-10-CM

## 2013-07-25 DIAGNOSIS — K589 Irritable bowel syndrome without diarrhea: Secondary | ICD-10-CM

## 2013-07-25 LAB — CBC
HCT: 41.3 % (ref 39.0–52.0)
Hemoglobin: 14.2 g/dL (ref 13.0–17.0)
MCH: 29.7 pg (ref 26.0–34.0)
MCHC: 34.4 g/dL (ref 30.0–36.0)
MCV: 86.4 fL (ref 78.0–100.0)
Platelets: 237 10*3/uL (ref 150–400)
RBC: 4.78 MIL/uL (ref 4.22–5.81)
RDW: 13.2 % (ref 11.5–15.5)
WBC: 4.9 10*3/uL (ref 4.0–10.5)

## 2013-07-25 LAB — POCT URINALYSIS DIPSTICK
Bilirubin, UA: NEGATIVE
Glucose, UA: NEGATIVE
Ketones, UA: NEGATIVE
Leukocytes, UA: NEGATIVE
Nitrite, UA: NEGATIVE
Protein, UA: NEGATIVE
Spec Grav, UA: 1.01
Urobilinogen, UA: 0.2
pH, UA: 5.5

## 2013-07-25 LAB — COMPREHENSIVE METABOLIC PANEL
ALT: 24 U/L (ref 0–53)
AST: 19 U/L (ref 0–37)
Albumin: 4.4 g/dL (ref 3.5–5.2)
Alkaline Phosphatase: 68 U/L (ref 39–117)
BUN: 10 mg/dL (ref 6–23)
CO2: 29 mEq/L (ref 19–32)
Calcium: 9.4 mg/dL (ref 8.4–10.5)
Chloride: 102 mEq/L (ref 96–112)
Creat: 1.11 mg/dL (ref 0.50–1.35)
Glucose, Bld: 92 mg/dL (ref 70–99)
Potassium: 4 mEq/L (ref 3.5–5.3)
Sodium: 138 mEq/L (ref 135–145)
Total Bilirubin: 0.6 mg/dL (ref 0.2–1.2)
Total Protein: 6.9 g/dL (ref 6.0–8.3)

## 2013-07-25 LAB — LIPID PANEL
Cholesterol: 166 mg/dL (ref 0–200)
HDL: 29 mg/dL — ABNORMAL LOW (ref >39)
LDL Cholesterol: 106 mg/dL — ABNORMAL HIGH (ref 0–99)
Total CHOL/HDL Ratio: 5.7 Ratio
Triglycerides: 153 mg/dL — ABNORMAL HIGH (ref <150)
VLDL: 31 mg/dL (ref 0–40)

## 2013-07-25 MED ORDER — CLONAZEPAM 0.5 MG PO TABS: ORAL_TABLET | ORAL | Status: DC

## 2013-07-25 NOTE — Progress Notes (Signed)
Subjective:    Patient ID: Micheal Roberts, male    DOB: 11/25/1970, 43 y.o.   MRN: [id number]  HPI See below   Review of Systems  Constitutional: Negative.   HENT: Negative.   Eyes: Negative.   Respiratory: Negative.   Cardiovascular: Negative.   Gastrointestinal: Negative.   Endocrine: Negative.   Genitourinary: Negative.   Musculoskeletal: Positive for neck pain.  Skin: Negative.   Allergic/Immunologic: Negative.   Neurological: Negative.   Hematological: Negative.   Psychiatric/Behavioral: Negative.        Objective:   Physical Exam  See below      Assessment & Plan:  See below     Patient ID: Micheal Roberts MRN: [id number], DOB: 07-17-1970 43 y.o. Date of Encounter: 07/25/2013, 12:32 PM  Primary Physician: Kennon Portela, MD  Chief Complaint: Physical (CPE)  HPI: 43 y.o. y/o male with history noted below here for CPE.  Doing well. Patient works for [employer]. He's married and enjoying his job. Patient does have some fullness in his neck which is worried about. He's still trying to quit smoking and does not want Chantix. He's considering taking patches and I referred him to the site on the telephone or website. The patient says the clonazepam is working well Patient does have intermittent upset stomach and gas pains and he's concerned about getting a colon cancer  Review of Systems: Consitutional: No fever, chills, fatigue, night sweats, lymphadenopathy, or weight changes. Eyes: No visual changes, eye redness, or discharge. ENT/Mouth: Ears: No otalgia, tinnitus, hearing loss, discharge. Nose: No congestion, rhinorrhea, sinus pain, or epistaxis. Throat: No sore throat, post nasal drip, or teeth pain. Cardiovascular: No CP, palpitations, diaphoresis, DOE, edema, orthopnea, PND. Respiratory: No cough, hemoptysis, SOB, or wheezing. Gastrointestinal: No anorexia, dysphagia, reflux, pain, nausea, vomiting, hematemesis, diarrhea, constipation, BRBPR, or  melena. Genitourinary: No dysuria, frequency, urgency, hematuria, incontinence, nocturia, decreased urinary stream, discharge, impotence, or testicular pain/masses. Musculoskeletal: No decreased ROM, myalgias, stiffness, joint swelling, or weakness. Skin: No rash, erythema, lesion changes, pain, warmth, jaundice, or pruritis. Neurological: No headache, dizziness, syncope, seizures, tremors, memory loss, coordination problems, or paresthesias. Psychological: No anxiety, depression, hallucinations, SI/HI. Endocrine: No fatigue, polydipsia, polyphagia, polyuria, or known diabetes. All other systems were reviewed and are otherwise negative.  Past Medical History  Diagnosis Date  . IBS (irritable bowel syndrome)   . Anxiety      Past Surgical History  Procedure Laterality Date  . Left acl arthroscopy      Home Meds:  Prior to Admission medications   Medication Sig Start Date End Date Taking? Authorizing Provider  clonazePAM (KLONOPIN) 0.5 MG tablet One to two daily as needed. 07/25/13  Yes Robyn Haber, MD  Multiple Vitamin (MULTIVITAMIN) tablet Take 1 tablet by mouth daily.   Yes Historical Provider, MD  colchicine 0.6 MG tablet 2 now and one in one hour then one daily 09/14/12   Ellison Carwin, MD  indomethacin (INDOCIN) 25 MG capsule Take 1 capsule (25 mg total) by mouth 2 (two) times daily with a meal. 09/14/12   Ellison Carwin, MD    Allergies: No Known Allergies  History   Social History  . Marital Status: Single    Spouse Name: N/A    Number of Children: N/A  . Years of Education: N/A   Occupational History  . Not on file.   Social History Main Topics  . Smoking status: Current Every Day Smoker -- 0.50 packs/day for 10 years    Types:  Cigarettes  . Smokeless tobacco: Not on file  . Alcohol Use: Yes     Comment: beer - only on ocassions  . Drug Use: No  . Sexual Activity: Yes   Other Topics Concern  . Not on file   Social History Narrative  . No narrative on  file    Family History  Problem Relation Age of Onset  . Hypertension Mother   . Anemia Mother   . Thyroid disease Father     Physical Exam: Blood pressure 118/82, pulse 75, temperature 98.1 F (36.7 C), temperature source Oral, resp. rate 16, height 6' 1.5" (1.867 m), weight 207 lb 9.6 oz (94.167 kg), SpO2 99.00%.  General: Well developed, well nourished, in no acute distress. HEENT: Normocephalic, atraumatic. Conjunctiva pink, sclera non-icteric. Pupils 2 mm constricting to 1 mm, round, regular, and equally reactive to light and accomodation. EOMI. Internal auditory canal clear. TMs with good cone of light and without pathology. Nasal mucosa pink. Nares are without discharge. No sinus tenderness. Oral mucosa pink. Dentition excellent. Pharynx without exudate.   Neck: Supple. Trachea midline. No thyromegaly. Full ROM. No lymphadenopathy. Lungs: Clear to auscultation bilaterally without wheezes, rales, or rhonchi. Breathing is of normal effort and unlabored. Cardiovascular: RRR with S1 S2. No murmurs, rubs, or gallops appreciated. Distal pulses 2+ symmetrically. No carotid or abdominal bruits Abdomen: Soft, non-tender, non-distended with normoactive bowel sounds. No hepatosplenomegaly or masses. No rebound/guarding. No CVA tenderness. Without hernias.   Genitourinary:  circumcised male. No penile lesions. Testes descended bilaterally, and smooth without tenderness or masses.  Musculoskeletal: Full range of motion and 5/5 strength throughout. Without swelling, atrophy, tenderness, crepitus, or warmth. Extremities without clubbing, cyanosis, or edema. Calves supple. Skin: Warm and moist without erythema, ecchymosis, wounds, or rash. Neuro: A+Ox3. CN II-XII grossly intact. Moves all extremities spontaneously. Full sensation throughout. Normal gait. DTR 2+ throughout upper and lower extremities. Finger to nose intact. Psych:  Responds to questions appropriately with a normal affect.   Studies:  CBC, CMET, Lipid, PSA UA:  Results for orders placed in visit on 07/25/13  POCT URINALYSIS DIPSTICK      Result Value Ref Range   Color, UA yellow     Clarity, UA clear     Glucose, UA neg     Bilirubin, UA neg     Ketones, UA neg     Spec Grav, UA 1.010     Blood, UA trace     pH, UA 5.5     Protein, UA neg     Urobilinogen, UA 0.2     Nitrite, UA neg     Leukocytes, UA Negative       Assessment/Plan:  43 y.o. y/o  male here for CPE Annual physical exam - Plan: CBC, Comprehensive metabolic panel, Lipid panel, PSA, POCT urinalysis dipstick  Irritable bowel syndrome - Plan: Ambulatory referral to Gastroenterology  Neck swelling - Plan: Ambulatory referral to ENT  Anxiety - Plan: clonazePAM (KLONOPIN) 0.5 MG tablet  GAD (generalized anxiety disorder) - Plan: clonazePAM (KLONOPIN) 0.5 MG tablet   -  Signed, Robyn Haber, MD 07/25/2013 12:32 PM

## 2013-07-25 NOTE — Patient Instructions (Addendum)
Health Maintenance, Males A healthy lifestyle and preventative care can promote health and wellness.  Maintain regular health, dental, and eye exams.  Eat a healthy diet. Foods like vegetables, fruits, whole grains, low-fat dairy products, and lean protein foods contain the nutrients you need and are low in calories. Decrease your intake of foods high in solid fats, added sugars, and salt. Get information about a proper diet from your health care provider, if necessary.  Regular physical exercise is one of the most important things you can do for your health. Most adults should get at least 150 minutes of moderate-intensity exercise (any activity that increases your heart rate and causes you to sweat) each week. In addition, most adults need muscle-strengthening exercises on 2 or more days a week.   Maintain a healthy weight. The body mass index (BMI) is a screening tool to identify possible weight problems. It provides an estimate of body fat based on height and weight. Your health care provider can find your BMI and can help you achieve or maintain a healthy weight. For males 20 years and older:  A BMI below 18.5 is considered underweight.  A BMI of 18.5 to 24.9 is normal.  A BMI of 25 to 29.9 is considered overweight.  A BMI of 30 and above is considered obese.  Maintain normal blood lipids and cholesterol by exercising and minimizing your intake of saturated fat. Eat a balanced diet with plenty of fruits and vegetables. Blood tests for lipids and cholesterol should begin at age 20 and be repeated every 5 years. If your lipid or cholesterol levels are high, you are over 50, or you are at high risk for heart disease, you may need your cholesterol levels checked more frequently.Ongoing high lipid and cholesterol levels should be treated with medicines, if diet and exercise are not working.  If you smoke, find out from your health care provider how to quit. If you do not use tobacco, do not  start.  Lung cancer screening is recommended for adults aged 55 80 years who are at high risk for developing lung cancer because of a history of smoking. A yearly low-dose CT scan of the lungs is recommended for people who have at least a 30-pack-year history of smoking and are a current smoker or have quit within the past 15 years. A pack year of smoking is smoking an average of 1 pack of cigarettes a day for 1 year (for example, a 30-pack-year history of smoking could mean smoking 1 pack a day for 30 years or 2 packs a day for 15 years). Yearly screening should continue until the smoker has stopped smoking for at least 15 years. Yearly screening should be stopped for people who develop a health problem that would prevent them from having lung cancer treatment.  If you choose to drink alcohol, do not have more than 2 drinks per day. One drink is considered to be 12 oz (360 mL) of beer, 5 oz (150 mL) of wine, or 1.5 oz (45 mL) of liquor.  Avoid use of street drugs. Do not share needles with anyone. Ask for help if you need support or instructions about stopping the use of drugs.  High blood pressure causes heart disease and increases the risk of stroke. Blood pressure should be checked at least every 1 2 years. Ongoing high blood pressure should be treated with medicines if weight loss and exercise are not effective.  If you are 45 43 years old, ask your health   care provider if you should take aspirin to prevent heart disease.  Diabetes screening involves taking a blood sample to check your fasting blood sugar level. This should be done once every 3 years after age 45, if you are at a normal weight and without risk factors for diabetes. Testing should be considered at a younger age or be carried out more frequently if you are overweight and have at least 1 risk factor for diabetes.  Colorectal cancer can be detected and often prevented. Most routine colorectal cancer screening begins at the age of 50  and continues through age 75. However, your health care provider may recommend screening at an earlier age if you have risk factors for colon cancer. On a yearly basis, your health care provider may provide home test kits to check for hidden blood in the stool. A small camera at the end of a tube may be used to directly examine the colon (sigmoidoscopy or colonoscopy) to detect the earliest forms of colorectal cancer. Talk to your health care provider about this at age 50, when routine screening begins. A direct exam of the colon should be repeated every 5 10 years through age 75, unless early forms of pre-cancerous polyps or small growths are found.  People who are at an increased risk for hepatitis B should be screened for this virus. You are considered at high risk for hepatitis B if:  You were born in a country where hepatitis B occurs often. Talk with your health care provider about which countries are considered high-risk.  Your parents were born in a high-risk country and you have not received a shot to protect against hepatitis B (hepatitis B vaccine).  You have HIV or AIDS.  You use needles to inject street drugs.  You live with, or have sex with, someone who has hepatitis B.  You are a man who has sex with other men (MSM).  You get hemodialysis treatment.  You take certain medicines for conditions like cancer, organ transplantation, and autoimmune conditions.  Hepatitis C blood testing is recommended for all people born from 1945 through 1965 and any individual with known risk factors for hepatitis C.  Healthy men should no longer receive prostate-specific antigen (PSA) blood tests as part of routine cancer screening. Talk to your health care provider about prostate cancer screening.  Testicular cancer screening is not recommended for adolescents or adult males who have no symptoms. Screening includes self-exam, a health care provider exam, and other screening tests. Consult with  your health care provider about any symptoms you have or any concerns you have about testicular cancer.  Practice safe sex. Use condoms and avoid high-risk sexual practices to reduce the spread of sexually transmitted infections (STIs).  Use sunscreen. Apply sunscreen liberally and repeatedly throughout the day. You should seek shade when your shadow is shorter than you. Protect yourself by wearing long sleeves, pants, a wide-brimmed hat, and sunglasses year round, whenever you are outdoors.  Tell your health care provider of new moles or changes in moles, especially if there is a change in shape or color. Also tell your provider if a mole is larger than the size of a pencil eraser.  A one-time screening for abdominal aortic aneurysm (AAA) and surgical repair of large AAAs by ultrasound is recommended for men aged 65 75 years who are current or former smokers.  Stay current with your vaccines (immunizations). Document Released: 10/01/2007 Document Revised: 01/23/2013 Document Reviewed: 08/30/2010 ExitCare Patient Information 2014 [employer].   Smoking Cessation Quitting smoking is important to your health and has many advantages. However, it is not always easy to quit since nicotine is a very addictive drug. Often times, people try 3 times or more before being able to quit. This document explains the best ways for you to prepare to quit smoking. Quitting takes hard work and a lot of effort, but you can do it. ADVANTAGES OF QUITTING SMOKING  You will live longer, feel better, and live better.  Your body will feel the impact of quitting smoking almost immediately.  Within 20 minutes, blood pressure decreases. Your pulse returns to its normal level.  After 8 hours, carbon monoxide levels in the blood return to normal. Your oxygen level increases.  After 24 hours, the chance of having a heart attack starts to decrease. Your breath, hair, and body stop smelling like smoke.  After 48 hours,  damaged nerve endings begin to recover. Your sense of taste and smell improve.  After 72 hours, the body is virtually free of nicotine. Your bronchial tubes relax and breathing becomes easier.  After 2 to 12 weeks, lungs can hold more air. Exercise becomes easier and circulation improves.  The risk of having a heart attack, stroke, cancer, or lung disease is greatly reduced.  After 1 year, the risk of coronary heart disease is cut in half.  After 5 years, the risk of stroke falls to the same as a nonsmoker.  After 10 years, the risk of lung cancer is cut in half and the risk of other cancers decreases significantly.  After 15 years, the risk of coronary heart disease drops, usually to the level of a nonsmoker.  If you are pregnant, quitting smoking will improve your chances of having a healthy baby.  The people you live with, especially any children, will be healthier.  You will have extra money to spend on things other than cigarettes. QUESTIONS TO THINK ABOUT BEFORE ATTEMPTING TO QUIT You may want to talk about your answers with your caregiver.  Why do you want to quit?  If you tried to quit in the past, what helped and what did not?  What will be the most difficult situations for you after you quit? How will you plan to handle them?  Who can help you through the tough times? Your family? Friends? A caregiver?  What pleasures do you get from smoking? What ways can you still get pleasure if you quit? Here are some questions to ask your caregiver:  How can you help me to be successful at quitting?  What medicine do you think would be best for me and how should I take it?  What should I do if I need more help?  What is smoking withdrawal like? How can I get information on withdrawal? GET READY  Set a quit date.  Change your environment by getting rid of all cigarettes, ashtrays, matches, and lighters in your home, car, or work. Do not let people smoke in your  home.  Review your past attempts to quit. Think about what worked and what did not. GET SUPPORT AND ENCOURAGEMENT You have a better chance of being successful if you have help. You can get support in many ways.  Tell your family, friends, and co-workers that you are going to quit and need their support. Ask them not to smoke around you.  Get individual, group, or telephone counseling and support. Programs are available at local hospitals and health centers. Call your local health department   for information about programs in your area.  Spiritual beliefs and practices may help some smokers quit.  Download a "quit meter" on your computer to keep track of quit statistics, such as how long you have gone without smoking, cigarettes not smoked, and money saved.  Get a self-help book about quitting smoking and staying off of tobacco. LEARN NEW SKILLS AND BEHAVIORS  Distract yourself from urges to smoke. Talk to someone, go for a walk, or occupy your time with a task.  Change your normal routine. Take a different route to work. Drink tea instead of coffee. Eat breakfast in a different place.  Reduce your stress. Take a hot bath, exercise, or read a book.  Plan something enjoyable to do every day. Reward yourself for not smoking.  Explore interactive web-based programs that specialize in helping you quit. GET MEDICINE AND USE IT CORRECTLY Medicines can help you stop smoking and decrease the urge to smoke. Combining medicine with the above behavioral methods and support can greatly increase your chances of successfully quitting smoking.  Nicotine replacement therapy helps deliver nicotine to your body without the negative effects and risks of smoking. Nicotine replacement therapy includes nicotine gum, lozenges, inhalers, nasal sprays, and skin patches. Some may be available over-the-counter and others require a prescription.  Antidepressant medicine helps people abstain from smoking, but how  this works is unknown. This medicine is available by prescription.  Nicotinic receptor partial agonist medicine simulates the effect of nicotine in your brain. This medicine is available by prescription. Ask your caregiver for advice about which medicines to use and how to use them based on your health history. Your caregiver will tell you what side effects to look out for if you choose to be on a medicine or therapy. Carefully read the information on the package. Do not use any other product containing nicotine while using a nicotine replacement product.  RELAPSE OR DIFFICULT SITUATIONS Most relapses occur within the first 3 months after quitting. Do not be discouraged if you start smoking again. Remember, most people try several times before finally quitting. You may have symptoms of withdrawal because your body is used to nicotine. You may crave cigarettes, be irritable, feel very hungry, cough often, get headaches, or have difficulty concentrating. The withdrawal symptoms are only temporary. They are strongest when you first quit, but they will go away within 10 14 days. To reduce the chances of relapse, try to:  Avoid drinking alcohol. Drinking lowers your chances of successfully quitting.  Reduce the amount of caffeine you consume. Once you quit smoking, the amount of caffeine in your body increases and can give you symptoms, such as a rapid heartbeat, sweating, and anxiety.  Avoid smokers because they can make you want to smoke.  Do not let weight gain distract you. Many smokers will gain weight when they quit, usually less than 10 pounds. Eat a healthy diet and stay active. You can always lose the weight gained after you quit.  Find ways to improve your mood other than smoking. FOR MORE INFORMATION  www.smokefree.gov  Document Released: 03/29/2001 Document Revised: 10/04/2011 Document Reviewed: 07/14/2011 ExitCare Patient Information 2014 [employer].  

## 2013-07-26 LAB — PSA: PSA: 0.25 ng/mL (ref <=4.00)

## 2014-03-14 ENCOUNTER — Telehealth: Payer: Self-pay

## 2014-03-14 ENCOUNTER — Ambulatory Visit (INDEPENDENT_AMBULATORY_CARE_PROVIDER_SITE_OTHER): Payer: BC Managed Care – PPO | Admitting: Emergency Medicine

## 2014-03-14 VITALS — BP 112/78 | HR 70 | Temp 98.2°F | Resp 18 | Ht 73.5 in | Wt 209.0 lb

## 2014-03-14 DIAGNOSIS — F419 Anxiety disorder, unspecified: Secondary | ICD-10-CM

## 2014-03-14 DIAGNOSIS — K589 Irritable bowel syndrome without diarrhea: Secondary | ICD-10-CM

## 2014-03-14 DIAGNOSIS — F411 Generalized anxiety disorder: Secondary | ICD-10-CM

## 2014-03-14 MED ORDER — CLONAZEPAM 0.5 MG PO TABS: ORAL_TABLET | ORAL | Status: DC

## 2014-03-14 MED ORDER — POLYETHYLENE GLYCOL 3350 17 GM/SCOOP PO POWD: 17.0000 g | Freq: Two times a day (BID) | ORAL | Status: DC | PRN

## 2014-03-14 NOTE — Patient Instructions (Signed)
Generalized Anxiety Disorder Generalized anxiety disorder (GAD) is a mental disorder. It interferes with life functions, including relationships, work, and school. GAD is different from normal anxiety, which everyone experiences at some point in their lives in response to specific life events and activities. Normal anxiety actually helps us prepare for and get through these life events and activities. Normal anxiety goes away after the event or activity is over.  GAD causes anxiety that is not necessarily related to specific events or activities. It also causes excess anxiety in proportion to specific events or activities. The anxiety associated with GAD is also difficult to control. GAD can vary from mild to severe. People with severe GAD can have intense waves of anxiety with physical symptoms (panic attacks).  SYMPTOMS The anxiety and worry associated with GAD are difficult to control. This anxiety and worry are related to many life events and activities and also occur more days than not for 6 months or longer. People with GAD also have three or more of the following symptoms (one or more in children):  Restlessness.   Fatigue.  Difficulty concentrating.   Irritability.  Muscle tension.  Difficulty sleeping or unsatisfying sleep. DIAGNOSIS GAD is diagnosed through an assessment by your health care provider. Your health care provider will ask you questions aboutyour mood,physical symptoms, and events in your life. Your health care provider may ask you about your medical history and use of alcohol or drugs, including prescription medicines. Your health care provider may also do a physical exam and blood tests. Certain medical conditions and the use of certain substances can cause symptoms similar to those associated with GAD. Your health care provider may refer you to a mental health specialist for further evaluation. TREATMENT The following therapies are usually used to treat GAD:    Medication. Antidepressant medication usually is prescribed for long-term daily control. Antianxiety medicines may be added in severe cases, especially when panic attacks occur.   Talk therapy (psychotherapy). Certain types of talk therapy can be helpful in treating GAD by providing support, education, and guidance. A form of talk therapy called cognitive behavioral therapy can teach you healthy ways to think about and react to daily life events and activities.  Stress managementtechniques. These include yoga, meditation, and exercise and can be very helpful when they are practiced regularly. A mental health specialist can help determine which treatment is best for you. Some people see improvement with one therapy. However, other people require a combination of therapies. Document Released: 07/30/2012 Document Revised: 08/19/2013 Document Reviewed: 07/30/2012 ExitCare Patient Information 2015 [employer]. This information is not intended to replace advice given to you by your health care provider. Make sure you discuss any questions you have with your health care provider.  

## 2014-03-14 NOTE — Telephone Encounter (Signed)
Pt is requesting a refill of klonopin

## 2014-03-14 NOTE — Progress Notes (Signed)
[facility] [address] [place] [address] [phone]  Date:  03/14/2014   Name:  Micheal Roberts   DOB:  05/30/1970   MRN:  [id number]  PCP:  Kennon Portela, MD    Chief Complaint: Medication Refill   History of Present Illness:  Micheal Roberts is a 43 y.o. very pleasant male patient who presents with the following:  History of anxiety disorder.  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 Medications effective in treating his anxiety Denies other complaint or health concern today.   There are no active problems to display for this patient.   Past Medical History  Diagnosis Date  . IBS (irritable bowel syndrome)   . Anxiety     Past Surgical History  Procedure Laterality Date  . Left acl arthroscopy      History  Substance Use Topics  . Smoking status: Current Every Day Smoker -- 0.50 packs/day for 10 years    Types: Cigarettes  . Smokeless tobacco: Not on file  . Alcohol Use: Yes     Comment: beer - only on ocassions    Family History  Problem Relation Age of Onset  . Hypertension Mother   . Anemia Mother   . Thyroid disease Father     No Known Allergies  Medication list has been reviewed and updated.  Current Outpatient Prescriptions on File Prior to Visit  Medication Sig Dispense Refill  . clonazePAM (KLONOPIN) 0.5 MG tablet One to two daily as needed. 60 tablet 5  . Multiple Vitamin (MULTIVITAMIN) tablet Take 1 tablet by mouth daily.    . colchicine 0.6 MG tablet 2 now and one in one hour then one daily (Patient not taking: Reported on 03/14/2014) 30 tablet 0  . indomethacin (INDOCIN) 25 MG capsule Take 1 capsule (25 mg total) by mouth 2 (two) times daily with a meal. (Patient not taking: Reported on 03/14/2014) 45 capsule 1   No current facility-administered medications on file prior to visit.    Review of Systems:  As per HPI, otherwise negative.    Physical Examination: Filed Vitals:   03/14/14 1608  BP: 112/78  Pulse: 70  Temp: 98.2 F (36.8 C)  Resp: 18   Filed Vitals:   03/14/14 1608  Height: 6' 1.5" (1.867 m)  Weight: 209 lb (94.802 kg)   Body mass index is 27.2 kg/(m^2). Ideal Body Weight: Weight in (lb) to have BMI = 25: 191.7   GEN: WDWN, NAD, Non-toxic, Alert & Oriented x 3 HEENT: Atraumatic, Normocephalic.  Ears and Nose: No external deformity. EXTR: No clubbing/cyanosis/edema NEURO: Normal gait.  PSYCH: Normally interactive. Conversant. Not depressed or anxious appearing.  Calm demeanor.    Assessment and Plan: Anxiety disorder Refill klonopin  Signed,  Ellison Carwin, MD

## 2014-08-21 ENCOUNTER — Other Ambulatory Visit: Payer: Self-pay | Admitting: Occupational Medicine

## 2014-08-21 ENCOUNTER — Ambulatory Visit: Payer: Self-pay

## 2014-08-21 DIAGNOSIS — M25572 Pain in left ankle and joints of left foot: Secondary | ICD-10-CM

## 2014-10-15 ENCOUNTER — Telehealth: Payer: Self-pay

## 2014-10-15 DIAGNOSIS — F419 Anxiety disorder, unspecified: Secondary | ICD-10-CM

## 2014-10-15 DIAGNOSIS — F411 Generalized anxiety disorder: Secondary | ICD-10-CM

## 2014-10-15 MED ORDER — CLONAZEPAM 0.5 MG PO TABS: ORAL_TABLET | ORAL | Status: DC

## 2014-10-15 NOTE — Telephone Encounter (Signed)
Please refill for 6 months.  Then she MUST have a vist

## 2014-10-15 NOTE — Telephone Encounter (Signed)
Pt is needing his klonopin refilled

## 2014-10-15 NOTE — Telephone Encounter (Signed)
Rx called in. Pt notified. 

## 2014-10-29 ENCOUNTER — Encounter (HOSPITAL_COMMUNITY): Payer: Self-pay | Admitting: *Deleted

## 2014-10-29 ENCOUNTER — Emergency Department (INDEPENDENT_AMBULATORY_CARE_PROVIDER_SITE_OTHER)
Admission: EM | Admit: 2014-10-29 | Discharge: 2014-10-29 | Disposition: A | Discharge status: Home / Self Care | Payer: BC Managed Care – PPO | Source: Home / Self Care | Attending: Family Medicine | Admitting: Family Medicine

## 2014-10-29 DIAGNOSIS — K111 Hypertrophy of salivary gland: Secondary | ICD-10-CM

## 2014-10-29 MED ORDER — AMOXICILLIN 500 MG PO CAPS: 500.0000 mg | ORAL_CAPSULE | Freq: Three times a day (TID) | ORAL | Status: DC

## 2014-10-29 NOTE — ED Provider Notes (Signed)
CSN: [id number]     Arrival date & time 10/29/14  1817 History   First MD Initiated Contact with Patient 10/29/14 1908     Chief Complaint  Patient presents with  . Facial Swelling   (Consider location/radiation/quality/duration/timing/severity/associated sxs/prior Treatment) Patient is a 44 y.o. male presenting with mouth sores. The history is provided by the patient.  Mouth Lesions Location of oral lesions: right submand neck. Quality:  Painful Pain details:    Quality:  Sore   Severity:  Mild Onset quality:  Sudden Severity:  Mild Progression:  Unchanged Chronicity:  New Context: possible infection   Relieved by:  None tried Worsened by:  Nothing tried Ineffective treatments:  None tried Associated symptoms: no rhinorrhea and no sore throat     Past Medical History  Diagnosis Date  . IBS (irritable bowel syndrome)   . Anxiety    Past Surgical History  Procedure Laterality Date  . Left acl arthroscopy     Family History  Problem Relation Age of Onset  . Hypertension Mother   . Anemia Mother   . Thyroid disease Father    History  Substance Use Topics  . Smoking status: Current Every Day Smoker -- 0.50 packs/day for 10 years    Types: Cigarettes  . Smokeless tobacco: Not on file  . Alcohol Use: Yes     Comment: beer - only on ocassions    Review of Systems  Constitutional: Negative.   HENT: Positive for mouth sores. Negative for postnasal drip, rhinorrhea and sore throat.   Hematological: Positive for adenopathy.    Allergies  Review of patient's allergies indicates no known allergies.  Home Medications   Prior to Admission medications   Medication Sig Start Date End Date Taking? Authorizing Provider  amoxicillin (AMOXIL) 500 MG capsule Take 1 capsule (500 mg total) by mouth 3 (three) times daily. 10/29/14   Billy Fischer, MD  clonazePAM (KLONOPIN) 0.5 MG tablet One to two daily as needed. 10/15/14   Roselee Culver, MD  colchicine 0.6 MG tablet 2 now  and one in one hour then one daily Patient not taking: Reported on 03/14/2014 09/14/12   Roselee Culver, MD  indomethacin (INDOCIN) 25 MG capsule Take 1 capsule (25 mg total) by mouth 2 (two) times daily with a meal. Patient not taking: Reported on 03/14/2014 09/14/12   Roselee Culver, MD  Multiple Vitamin (MULTIVITAMIN) tablet Take 1 tablet by mouth daily.    Historical Provider, MD  polyethylene glycol powder (GLYCOLAX/MIRALAX) powder Take 17 g by mouth 2 (two) times daily as needed. 03/14/14   Roselee Culver, MD   BP 127/82 mmHg  Pulse 71  Temp(Src) 98.3 F (36.8 C) (Oral)  Resp 18  SpO2 97% Physical Exam  Constitutional: He is oriented to person, place, and time. He appears well-developed and well-nourished. No distress.  HENT:  Mouth/Throat: Oropharynx is clear and moist.  Eyes: Pupils are equal, round, and reactive to light.  Neck: Normal range of motion. Neck supple. No thyromegaly present.  Sl tender right sublingual gland, swelling, thyroid nl no sublingual swelling.  Lymphadenopathy:    He has no cervical adenopathy.  Neurological: He is alert and oriented to person, place, and time.  Skin: Skin is warm and dry.  Nursing note and vitals reviewed.   ED Course  Procedures (including critical care time) Labs Review Labs Reviewed - No data to display  Imaging Review No results found.   MDM   1. Salivary gland  enlargement        Billy Fischer, MD 10/29/14 1921

## 2014-10-29 NOTE — Discharge Instructions (Signed)
Drink plenty of fluids, use lemon or lime candy and take all of antibiotic, see ent if needed.

## 2014-10-29 NOTE — ED Notes (Signed)
Swelling  r  Side  Neck  This  Am          This  Am      -      Tender  To  The  Touch          Pt  Sitting  Upright on  The  Exam table  Speaking in  Complete  sentances     Skin is  Warm  And  Dry

## 2015-05-25 ENCOUNTER — Ambulatory Visit (INDEPENDENT_AMBULATORY_CARE_PROVIDER_SITE_OTHER): Payer: BC Managed Care – PPO | Admitting: Family Medicine

## 2015-05-25 ENCOUNTER — Ambulatory Visit: Payer: BC Managed Care – PPO

## 2015-05-25 VITALS — BP 128/80 | HR 62 | Temp 98.2°F | Resp 16 | Ht 74.0 in | Wt 217.0 lb

## 2015-05-25 DIAGNOSIS — Z23 Encounter for immunization: Secondary | ICD-10-CM | POA: Diagnosis not present

## 2015-05-25 DIAGNOSIS — J069 Acute upper respiratory infection, unspecified: Secondary | ICD-10-CM | POA: Diagnosis not present

## 2015-05-25 DIAGNOSIS — F411 Generalized anxiety disorder: Secondary | ICD-10-CM

## 2015-05-25 DIAGNOSIS — F419 Anxiety disorder, unspecified: Secondary | ICD-10-CM

## 2015-05-25 MED ORDER — CLONAZEPAM 0.5 MG PO TABS: ORAL_TABLET | ORAL | Status: DC

## 2015-05-25 MED ORDER — AZITHROMYCIN 250 MG PO TABS: ORAL_TABLET | ORAL | Status: DC

## 2015-05-25 NOTE — Progress Notes (Signed)
Patient ID: Micheal Roberts, male    DOB: 06-26-1970  Age: 45 y.o. MRN: [id number]  Chief Complaint  Patient presents with  . chest congestion    x 3 day  . Immunizations    flu  . Medication Refill    Klonopin .5 mg     Subjective:   45 year old man who has had a respiratory infection for about 5 days. Initially it was just a scratchiness and mild congestion, got worse this weekend. He's been coughing and blowing. He is bringing up purulent phlegm. Smokes about two thirds her pack of cigarettes a day. He has not had his flu shot this year. He has problems with anxiety, takes clonazepam intermittently for that when he has anxiety problems. This is not every day, just hits him from time to time for reasons he is not totally sure.  Current allergies, medications, problem list, past/family and social histories reviewed.  Objective:  BP 128/80 mmHg  Pulse 62  Temp(Src) 98.2 F (36.8 C) (Oral)  Resp 16  Ht 6\' 2"  (1.88 m)  Wt 217 lb (98.431 kg)  BMI 27.85 kg/m2  SpO2 98%  No major acute distress. TMs normal. Throat clear. Neck supple without significant nodes. Chest clear to auscultation. Heart regular without any murmurs  Assessment & Plan:   Assessment: 1. Needs flu shot   2. Anxiety   3. Acute upper respiratory infection   4. GAD (generalized anxiety disorder)       Plan: With it getting worse rather than better will go ahead and treat him since he is a smoker. Advised quitting cigarettes. Continue him with some more antianxiety medication only for as needed use, not regular.  Discussed stopping smoking for about 5 minutes.  Orders Placed This Encounter  Procedures  . Flu Vaccine QUAD 36+ mos IM    Meds ordered this encounter  Medications  . azithromycin (ZITHROMAX) 250 MG tablet    Sig: Take 2 tabs PO x 1 dose, then 1 tab PO QD x 4 days    Dispense:  6 tablet    Refill:  0  . DISCONTD: clonazePAM (KLONOPIN) 0.5 MG tablet    Sig: One to two daily as needed.   Dispense:  60 tablet    Refill:  2  . clonazePAM (KLONOPIN) 0.5 MG tablet    Sig: One to two daily as needed.    Dispense:  60 tablet    Refill:  2         Patient Instructions  Drink plenty of fluids and make sure you get a lot of rest so your body can get rid of this infection  Take the azithromycin 2 pills initially, then 1 daily for 4 days  Use over-the-counter Robitussin-DM or Mucinex DM as needed for cough  Take Claritin-D or Allegra-D or Zyrtec-D if needed for head congestion  I recommend you minimize the use of the clonazepam, and using it only when needed for anxiety. Advise regular daily exercise which can reduce the need for antianxiety medication. Cardio type exercise that you keep your heart rate elevated for at least 30 minutes at least 5 days a week is recommended.  Return if not improving  Set a quit date for stopping smoking and taper as we discussed, quitting on the predetermined date. Without that he will always make excuses. Once you have quit never touch another one because you are addicted and will tend to go back to smoking very quickly if you allow yourself even 1.  Return in about 6 months (around 11/22/2015), or if symptoms worsen or fail to improve.   HOPPER,DAVID, MD 05/25/2015

## 2015-05-25 NOTE — Patient Instructions (Addendum)
Drink plenty of fluids and make sure you get a lot of rest so your body can get rid of this infection  Take the azithromycin 2 pills initially, then 1 daily for 4 days  Use over-the-counter Robitussin-DM or Mucinex DM as needed for cough  Take Claritin-D or Allegra-D or Zyrtec-D if needed for head congestion  I recommend you minimize the use of the clonazepam, and using it only when needed for anxiety. Advise regular daily exercise which can reduce the need for antianxiety medication. Cardio type exercise that you keep your heart rate elevated for at least 30 minutes at least 5 days a week is recommended.  Return if not improving  Set a quit date for stopping smoking and taper as we discussed, quitting on the predetermined date. Without that he will always make excuses. Once you have quit never touch another one because you are addicted and will tend to go back to smoking very quickly if you allow yourself even 1.

## 2015-10-15 ENCOUNTER — Other Ambulatory Visit: Payer: Self-pay | Admitting: Family Medicine

## 2015-10-17 ENCOUNTER — Other Ambulatory Visit: Payer: Self-pay

## 2015-10-17 DIAGNOSIS — F419 Anxiety disorder, unspecified: Secondary | ICD-10-CM

## 2015-10-17 DIAGNOSIS — F411 Generalized anxiety disorder: Secondary | ICD-10-CM

## 2015-10-17 MED ORDER — CLONAZEPAM 0.5 MG PO TABS: ORAL_TABLET | ORAL | Status: DC

## 2015-10-17 NOTE — Telephone Encounter (Signed)
Patient is leaving town for a week and needs his refill on clonazePAM (KLONOPIN) 0.5 MG tablet   [phone] (H)

## 2015-10-17 NOTE — Telephone Encounter (Signed)
Clonazepam 0.5mg  tabs X 60 Sig: 1-2 tabs daily PRN Dr. Linna Darner asked PA to give 30 days worth - tell pt to establish care with another provider he is retired. IC pt & advised of above. He states he will see Micheal Roberts after 10/20/2015

## 2015-10-17 NOTE — Telephone Encounter (Signed)
I am not at office. Ask a PA to give one refill and to tell patient no more refills until seen by someone since I am retired and can no longer be his regular doctor.

## 2015-12-01 ENCOUNTER — Ambulatory Visit (INDEPENDENT_AMBULATORY_CARE_PROVIDER_SITE_OTHER): Payer: BC Managed Care – PPO | Admitting: Family Medicine

## 2015-12-01 ENCOUNTER — Other Ambulatory Visit: Payer: Self-pay

## 2015-12-01 DIAGNOSIS — F411 Generalized anxiety disorder: Secondary | ICD-10-CM

## 2015-12-01 DIAGNOSIS — F419 Anxiety disorder, unspecified: Secondary | ICD-10-CM | POA: Diagnosis not present

## 2015-12-01 MED ORDER — CLONAZEPAM 0.5 MG PO TABS: 0.5000 mg | ORAL_TABLET | Freq: Two times a day (BID) | ORAL | 1 refills | Status: DC | PRN

## 2015-12-01 MED ORDER — SERTRALINE HCL 50 MG PO TABS: 50.0000 mg | ORAL_TABLET | Freq: Every day | ORAL | 2 refills | Status: DC

## 2015-12-01 NOTE — Telephone Encounter (Signed)
Lexine Baton has talked to this pt 4 times today trying to get him to be able to come in today for appt, but see below. She asked me to try to expedite this for pt if possible. Pt last seen for anxiety by Dr Linna Darner in Feb. Dr Ouida Sills before that. He reported that he sees Dr Carlota Raspberry, but he hasn't seen him since 2015. Pt will need to est with one PCP to manage this, but can someone please review this in Dr Hopper's absence?

## 2015-12-01 NOTE — Progress Notes (Signed)
Subjective:  By signing my name below, I, Micheal Roberts, attest that this documentation has been prepared under the direction and in the presence of Micheal Ray, MD. Electronically Signed: Moises Roberts, [place]. 12/01/2015 , 6:59 PM .  Patient was seen in Room 12 .   Patient ID: Micheal Roberts, male    DOB: Jul 24, 1970, 45 y.o.   MRN: [id number] Chief Complaint  Patient presents with  . Medication Refill   HPI Micheal Roberts is a 45 y.o. male Here for follow up. He has a h/o anxiety, last seen by me in February 2015, but had been patient of Dr. Pauletta Browns at that time. He has had persistent anxiety since 2005. At that visit, the patient stated he had not had relief with prozac in the past, but only took it for 1 week and "felt different". He declined referral to counseling so we discussed coping techniques and intermittent klonopin only if needed. Since that visit, he's been seen by Dr. Linna Darner in February 2017, and has not been in since November 2015 with Dr. Ouida Sills. At his last visit with Dr. Linna Darner, he was only using klonopin intermittently as well. His last prescription was for #60 on July 1st. Prior to that prescription, he had #60 with 2 refills on February 6th.   Patient is requesting medication refill of his klonopin, last dose was 2 days ago. He states he's still taking it as needed, and this varies. In a typical week, he may need it for 1~2 days, with 1 dose a day. He would usually take it before he goes to work when he becomes anxious with worry and stress. He's been trying to work out about once a week. He was suggested by Dr. Linna Darner to see a therapist, but the patient denies seeing a therapist. He rarely drinks alcohol. He denies illicit drug use. He denies self injury or suicidal ideation. He denies noticing any new lumps in his throat.   He's the oldest of his siblings, and whenever something happens with his family, he has to be the one to deal with it.   He currently  works in Theatre manager for [employer].   There are no active problems to display for this patient.  Past Medical History:  Diagnosis Date  . Anxiety   . IBS (irritable bowel syndrome)    Past Surgical History:  Procedure Laterality Date  . left ACL arthroscopy     No Known Allergies Prior to Admission medications   Medication Sig Start Date End Date Taking? Authorizing Provider  clonazePAM (KLONOPIN) 0.5 MG tablet One to two daily as needed. 10/17/15  Yes Tereasa Coop, PA-C  Multiple Vitamin (MULTIVITAMIN) tablet Take 1 tablet by mouth daily. Reported on 05/25/2015   Yes Historical Provider, MD  azithromycin (ZITHROMAX) 250 MG tablet Take 2 tabs PO x 1 dose, then 1 tab PO QD x 4 days Patient not taking: Reported on 12/01/2015 05/25/15   Posey Boyer, MD   Social History   Social History  . Marital status: Married    Spouse name: N/A  . Number of children: N/A  . Years of education: N/A   Occupational History  . Not on file.   Social History Main Topics  . Smoking status: Current Every Day Smoker    Packs/day: 0.50    Years: 10.00    Types: Cigarettes  . Smokeless tobacco: Never Used  . Alcohol use 0.0 oz/week     Comment: beer - only on ocassions  .  Drug use: No  . Sexual activity: Yes   Other Topics Concern  . Not on file   Social History Narrative  . No narrative on file   Review of Systems  Constitutional: Negative for chills, fatigue and fever.  Respiratory: Negative for cough, shortness of breath and wheezing.   Cardiovascular: Negative for chest pain.  Gastrointestinal: Negative for diarrhea, nausea and vomiting.  Neurological: Negative for weakness and numbness.  Psychiatric/Behavioral: Negative for self-injury and suicidal ideas. The patient is nervous/anxious.        Objective:   Physical Exam  Constitutional: He is oriented to person, place, and time. He appears well-developed and well-nourished. No distress.  HENT:  Head: Normocephalic and  atraumatic.  Eyes: EOM are normal. Pupils are equal, round, and reactive to light.  Neck: Neck supple. No thyromegaly present.  Cardiovascular: Normal rate.   Pulmonary/Chest: Effort normal. No respiratory distress.  Musculoskeletal: Normal range of motion.  Neurological: He is alert and oriented to person, place, and time.  Skin: Skin is warm and dry.  Psychiatric: He has a normal mood and affect. His behavior is normal.  Nursing note and vitals reviewed.   Vitals:   12/01/15 1816  BP: 130/84  Pulse: 70  Resp: 16  Temp: 98.1 F (36.7 C)  TempSrc: Oral  SpO2: 99%  Weight: 229 lb 9.6 oz (104.1 kg)  Height: 6' (1.829 m)      Assessment & Plan:   Micheal Roberts is a 45 y.o. male Anxiety - Plan: clonazePAM (KLONOPIN) 0.5 MG tablet, sertraline (ZOLOFT) 50 MG tablet  GAD (generalized anxiety disorder) - Plan: clonazePAM (KLONOPIN) 0.5 MG tablet, sertraline (ZOLOFT) 50 MG tablet  Generalized anxiety with situational worsening, notices mostly prior to going to work. No work changes, or other significant changes in life recently. Still requiring frequent Klonopin throughout the week.  - Discussed change in management with Zoloft 50 mg daily, then Klonopin if needed for breakthrough symptoms.   -Also recommended counseling/CBT as this may also help with his anxiety. Continue exercise and other stress management/coping techniques. Follow-up within 6-8 weeks.   Meds ordered this encounter  Medications  . clonazePAM (KLONOPIN) 0.5 MG tablet    Sig: Take 1 tablet (0.5 mg total) by mouth 2 (two) times daily as needed for anxiety.    Dispense:  60 tablet    Refill:  1  . sertraline (ZOLOFT) 50 MG tablet    Sig: Take 1 tablet (50 mg total) by mouth daily.    Dispense:  30 tablet    Refill:  2   Patient Instructions   Based on your need for the Klonopin, I do recommend starting another medication once per day to help manage anxiety symptoms. Zoloft 1 pill once per day (this may lessen  the need for the Klonopin). You can still take Klonopin up to twice per day if needed, but follow up with me within the next 2 months to see how your symptoms are doing. After the first 10-14 days, if you're not tolerating the Zoloft, let me know if we can look into other options.  See other information below for coping techniques and anxiety treatment, but I would like you to exercise most if not all days per week, and phone numbers for counselors were given below. If you need other numbers, please let me know.  Vivia Budge: [id number] Arvil [id number] [phone]  Return to the clinic or go to the nearest emergency room if any of your symptoms worsen or  new symptoms occur.  Generalized Anxiety Disorder Generalized anxiety disorder (GAD) is a mental disorder. It interferes with life functions, including relationships, work, and school. GAD is different from normal anxiety, which everyone experiences at some point in their lives in response to specific life events and activities. Normal anxiety actually helps [place] prepare for and get through these life events and activities. Normal anxiety goes away after the event or activity is over.  GAD causes anxiety that is not necessarily related to specific events or activities. It also causes excess anxiety in proportion to specific events or activities. The anxiety associated with GAD is also difficult to control. GAD can vary from mild to severe. People with severe GAD can have intense waves of anxiety with physical symptoms (panic attacks).  SYMPTOMS The anxiety and worry associated with GAD are difficult to control. This anxiety and worry are related to many life events and activities and also occur more days than not for 6 months or longer. People with GAD also have three or more of the following symptoms (one or more in children):  Restlessness.   Fatigue.  Difficulty concentrating.   Irritability.  Muscle tension.  Difficulty sleeping or  unsatisfying sleep. DIAGNOSIS GAD is diagnosed through an assessment by your health care provider. Your health care provider will ask you questions aboutyour mood,physical symptoms, and events in your life. Your health care provider may ask you about your medical history and use of alcohol or drugs, including prescription medicines. Your health care provider may also do a physical exam and Roberts tests. Certain medical conditions and the use of certain substances can cause symptoms similar to those associated with GAD. Your health care provider may refer you to a mental health specialist for further evaluation. TREATMENT The following therapies are usually used to treat GAD:   Medication. Antidepressant medication usually is prescribed for long-term daily control. Antianxiety medicines may be added in severe cases, especially when panic attacks occur.   Talk therapy (psychotherapy). Certain types of talk therapy can be helpful in treating GAD by providing support, education, and guidance. A form of talk therapy called cognitive behavioral therapy can teach you healthy ways to think about and react to daily life events and activities.  Stress managementtechniques. These include yoga, meditation, and exercise and can be very helpful when they are practiced regularly. A mental health specialist can help determine which treatment is best for you. Some people see improvement with one therapy. However, other people require a combination of therapies.   This information is not intended to replace advice given to you by your health care provider. Make sure you discuss any questions you have with your health care provider.   Document Released: 07/30/2012 Document Revised: 04/25/2014 Document Reviewed: 07/30/2012 Elsevier Interactive Patient Education [employer].   IF you received an x-Roberts today, you will receive an invoice from [facility]. Please contact [facility] at  [phone] with questions or concerns regarding your invoice.   IF you received labwork today, you will receive an invoice from [employer]. Please contact [facility] at [phone] with questions or concerns regarding your invoice.   Our billing staff will not be able to assist you with questions regarding bills from these companies.  You will be contacted with the lab results as soon as they are available. The fastest way to get your results is to activate your My Chart account. Instructions are located on the last page of this paperwork. If you have not  heard from [place] regarding the results in 2 weeks, please contact this office.        I personally performed the services described in this documentation, which was scribed in my presence. The recorded information has been reviewed and considered, and addended by me as needed.   Signed,   Micheal Ray, MD [facility].  12/02/15 11:59 PM

## 2015-12-01 NOTE — Telephone Encounter (Signed)
Patient needs a refill for Klonopin he is out. He is unable to pay his co pay and balance today.He has spoken to his insurance company about his insurance and he has a high co pay plus 55 balance.  He would like 1 refill till he can come back in and is able to pay balance and co pay. He can be reached @ [phone].

## 2015-12-01 NOTE — Patient Instructions (Addendum)
Based on your need for the Klonopin, I do recommend starting another medication once per day to help manage anxiety symptoms. Zoloft 1 pill once per day (this may lessen the need for the Klonopin). You can still take Klonopin up to twice per day if needed, but follow up with me within the next 2 months to see how your symptoms are doing. After the first 10-14 days, if you're not tolerating the Zoloft, let me know if we can look into other options.  See other information below for coping techniques and anxiety treatment, but I would like you to exercise most if not all days per week, and phone numbers for counselors were given below. If you need other numbers, please let me know.  Vivia Budge: [id number] Arvil [id number] [phone]  Return to the clinic or go to the nearest emergency room if any of your symptoms worsen or new symptoms occur.  Generalized Anxiety Disorder Generalized anxiety disorder (GAD) is a mental disorder. It interferes with life functions, including relationships, work, and school. GAD is different from normal anxiety, which everyone experiences at some point in their lives in response to specific life events and activities. Normal anxiety actually helps [place] prepare for and get through these life events and activities. Normal anxiety goes away after the event or activity is over.  GAD causes anxiety that is not necessarily related to specific events or activities. It also causes excess anxiety in proportion to specific events or activities. The anxiety associated with GAD is also difficult to control. GAD can vary from mild to severe. People with severe GAD can have intense waves of anxiety with physical symptoms (panic attacks).  SYMPTOMS The anxiety and worry associated with GAD are difficult to control. This anxiety and worry are related to many life events and activities and also occur more days than not for 6 months or longer. People with GAD also have three or more of the  following symptoms (one or more in children):  Restlessness.   Fatigue.  Difficulty concentrating.   Irritability.  Muscle tension.  Difficulty sleeping or unsatisfying sleep. DIAGNOSIS GAD is diagnosed through an assessment by your health care provider. Your health care provider will ask you questions aboutyour mood,physical symptoms, and events in your life. Your health care provider may ask you about your medical history and use of alcohol or drugs, including prescription medicines. Your health care provider may also do a physical exam and blood tests. Certain medical conditions and the use of certain substances can cause symptoms similar to those associated with GAD. Your health care provider may refer you to a mental health specialist for further evaluation. TREATMENT The following therapies are usually used to treat GAD:   Medication. Antidepressant medication usually is prescribed for long-term daily control. Antianxiety medicines may be added in severe cases, especially when panic attacks occur.   Talk therapy (psychotherapy). Certain types of talk therapy can be helpful in treating GAD by providing support, education, and guidance. A form of talk therapy called cognitive behavioral therapy can teach you healthy ways to think about and react to daily life events and activities.  Stress managementtechniques. These include yoga, meditation, and exercise and can be very helpful when they are practiced regularly. A mental health specialist can help determine which treatment is best for you. Some people see improvement with one therapy. However, other people require a combination of therapies.   This information is not intended to replace advice given to you by your health care  provider. Make sure you discuss any questions you have with your health care provider.   Document Released: 07/30/2012 Document Revised: 04/25/2014 Document Reviewed: 07/30/2012 Elsevier Interactive  Patient Education [employer].   IF you received an x-ray today, you will receive an invoice from [facility]. Please contact [facility] at [phone] with questions or concerns regarding your invoice.   IF you received labwork today, you will receive an invoice from [employer]. Please contact [facility] at [phone] with questions or concerns regarding your invoice.   Our billing staff will not be able to assist you with questions regarding bills from these companies.  You will be contacted with the lab results as soon as they are available. The fastest way to get your results is to activate your My Chart account. Instructions are located on the last page of this paperwork. If you have not heard from [place] regarding the results in 2 weeks, please contact this office.

## 2015-12-01 NOTE — Telephone Encounter (Signed)
Patient in office to be seen by myself.

## 2016-04-19 ENCOUNTER — Other Ambulatory Visit: Payer: Self-pay | Admitting: Family Medicine

## 2016-04-19 DIAGNOSIS — F411 Generalized anxiety disorder: Secondary | ICD-10-CM

## 2016-04-19 DIAGNOSIS — F419 Anxiety disorder, unspecified: Secondary | ICD-10-CM

## 2016-04-19 NOTE — Telephone Encounter (Signed)
Last OV 8/15 and was given #60 w/ 1 RF at that visit.

## 2016-04-19 NOTE — Telephone Encounter (Signed)
I will refill for #60, but needs to follow-up prior to those running out as planned to follow-up in 2 months when seen in August.

## 2016-04-20 NOTE — Telephone Encounter (Signed)
Faxed to pharmacy

## 2016-04-20 NOTE — Telephone Encounter (Signed)
Pt advised and tx up front for an appt 

## 2016-06-21 ENCOUNTER — Ambulatory Visit (INDEPENDENT_AMBULATORY_CARE_PROVIDER_SITE_OTHER): Payer: BC Managed Care – PPO | Admitting: Family Medicine

## 2016-06-21 VITALS — BP 124/80 | HR 74 | Temp 98.6°F | Resp 17 | Ht 75.0 in | Wt 216.0 lb

## 2016-06-21 DIAGNOSIS — Z23 Encounter for immunization: Secondary | ICD-10-CM

## 2016-06-21 DIAGNOSIS — F419 Anxiety disorder, unspecified: Secondary | ICD-10-CM

## 2016-06-21 DIAGNOSIS — Z720 Tobacco use: Secondary | ICD-10-CM

## 2016-06-21 DIAGNOSIS — F411 Generalized anxiety disorder: Secondary | ICD-10-CM | POA: Diagnosis not present

## 2016-06-21 MED ORDER — CLONAZEPAM 0.5 MG PO TABS: 0.5000 mg | ORAL_TABLET | Freq: Two times a day (BID) | ORAL | 0 refills | Status: DC | PRN

## 2016-06-21 MED ORDER — BUPROPION HCL ER (SR) 150 MG PO TB12: 150.0000 mg | ORAL_TABLET | Freq: Two times a day (BID) | ORAL | 5 refills | Status: DC

## 2016-06-21 NOTE — Patient Instructions (Addendum)
Start Wellbutrin 126m once per day for 3 days, then increase to twice per day.  This should lessen cravings for smoking as well as anxiety symptoms.  Ok to use klonopin if needed, but this medicine should lessen need.   [facility] offers smoking cessation clinics. Registration is required. To register call [phone] register online at [email or web address]  I encourage you to meet with therapist to discuss anxiety and ways to manage those symptoms.  AVivia Budge [phone] Chaco:[phone] Follow up in next 6 months for physical.   Return to the clinic or go to the nearest emergency room if any of your symptoms worsen or new symptoms occur.   Stress and Stress Management Stress is a normal reaction to life events. It is what you feel when life demands more than you are used to or more than you can handle. Some stress can be useful. For example, the stress reaction can help you catch the last bus of the day, study for a test, or meet a deadline at work. But stress that occurs too often or for too long can cause problems. It can affect your emotional health and interfere with relationships and normal daily activities. Too much stress can weaken your immune system and increase your risk for physical illness. If you already have a medical problem, stress can make it worse. What are the causes? All sorts of life events may cause stress. An event that causes stress for one person may not be stressful for another person. Major life events commonly cause stress. These may be positive or negative. Examples include losing your job, moving into a new home, getting married, having a baby, or losing a loved one. Less obvious life events may also cause stress, especially if they occur day after day or in combination. Examples include working long hours, driving in traffic, caring for children, being in debt, or being in a difficult relationship. What are the signs or symptoms? Stress may cause  emotional symptoms including, the following:  Anxiety. This is feeling worried, afraid, on edge, overwhelmed, or out of control.  Anger. This is feeling irritated or impatient.  Depression. This is feeling sad, down, helpless, or guilty.  Difficulty focusing, remembering, or making decisions. Stress may cause physical symptoms, including the following:  Aches and pains. These may affect your head, neck, back, stomach, or other areas of your body.  Tight muscles or clenched jaw.  Low energy or trouble sleeping. Stress may cause unhealthy behaviors, including the following:  Eating to feel better (overeating) or skipping meals.  Sleeping too little, too much, or both.  Working too much or putting off tasks (procrastination).  Smoking, drinking alcohol, or using drugs to feel better. How is this diagnosed? Stress is diagnosed through an assessment by your health care provider. Your health care provider will ask questions about your symptoms and any stressful life events.Your health care provider will also ask about your medical history and may order blood tests or other tests. Certain medical conditions and medicine can cause physical symptoms similar to stress. Mental illness can cause emotional symptoms and unhealthy behaviors similar to stress. Your health care provider may refer you to a mental health professional for further evaluation. How is this treated? Stress management is the recommended treatment for stress.The goals of stress management are reducing stressful life events and coping with stress in healthy ways. Techniques for reducing stressful life events include the following:  Stress identification. Self-monitor for stress and identify what  causes stress for you. These skills may help you to avoid some stressful events.  Time management. Set your priorities, keep a calendar of events, and learn to say "no." These tools can help you avoid making too many  commitments. Techniques for coping with stress include the following:  Rethinking the problem. Try to think realistically about stressful events rather than ignoring them or overreacting. Try to find the positives in a stressful situation rather than focusing on the negatives.  Exercise. Physical exercise can release both physical and emotional tension. The key is to find a form of exercise you enjoy and do it regularly.  Relaxation techniques. These relax the body and mind. Examples include yoga, meditation, tai chi, biofeedback, deep breathing, progressive muscle relaxation, listening to music, being out in nature, journaling, and other hobbies. Again, the key is to find one or more that you enjoy and can do regularly.  Healthy lifestyle. Eat a balanced diet, get plenty of sleep, and do not smoke. Avoid using alcohol or drugs to relax.  Strong support network. Spend time with family, friends, or other people you enjoy being around.Express your feelings and talk things over with someone you trust. Counseling or talktherapy with a mental health professional may be helpful if you are having difficulty managing stress on your own. Medicine is typically not recommended for the treatment of stress.Talk to your health care provider if you think you need medicine for symptoms of stress. Follow these instructions at home:  Keep all follow-up visits as directed by your health care provider.  Take all medicines as directed by your health care provider. Contact a health care provider if:  Your symptoms get worse or you start having new symptoms.  You feel overwhelmed by your problems and can no longer manage them on your own. Get help right away if:  You feel like hurting yourself or someone else. This information is not intended to replace advice given to you by your health care provider. Make sure you discuss any questions you have with your health care provider. Document Released: 09/28/2000  Document Revised: 09/10/2015 Document Reviewed: 11/27/2012 Elsevier Interactive Patient Education  2017 [employer].    IF you received an x-ray today, you will receive an invoice from [facility]. Please contact [facility] at [phone] with questions or concerns regarding your invoice.   IF you received labwork today, you will receive an invoice from [facility]. Please contact [facility] at [phone] with questions or concerns regarding your invoice.   Our billing staff will not be able to assist you with questions regarding bills from these companies.  You will be contacted with the lab results as soon as they are available. The fastest way to get your results is to activate your My Chart account. Instructions are located on the last page of this paperwork. If you have not heard from [place] regarding the results in 2 weeks, please contact this office.

## 2016-06-21 NOTE — Progress Notes (Signed)
Subjective:    Patient ID: Micheal Roberts, male    DOB: 10/19/1970, 46 y.o.   MRN: [id number]  HPI Micheal Roberts is a 46 y.o. male Here for follow-up to discuss anxiety, and medication refill. Last office visit with me in August 2017. He previously been seen by me in February 2015. Still with intermittent dosing for situational anxiety only with approximately 2 doses per week. Had not seen a therapist at that time, but exercising once per week. At August visit, started Zoloft 50 mg daily, with Klonopin 0.5 mg as needed for breakthrough symptoms. #60 with 1 refill of Klonopin was provided at that time. Recommended counseling, exercise stress management techniques, and follow-up in 6-8 weeks. Anxiety started after loss of daughter in 2005.   Felt very drowsy when taking the Zoloft. Tried taking for 3 weeks, and still felt drowsy.  Does not feel drowsy when taking Klonopin. Usually feels anxiety before going to work.  Takes med in morning about 3 times per week. Only exercising 1 time per week due to time constraints. Has not met with counselor - life is busy, did not have time. Denies depression/SI/HI. Out of klonopin.   Still smoking - would like to quit, but quitting cold [place] in December, only lasted 3 days.  Has not tried medicine in past.  Would like to try something   There are no active problems to display for this patient.  Past Medical History:  Diagnosis Date  . Anxiety   . IBS (irritable bowel syndrome)    Past Surgical History:  Procedure Laterality Date  . left ACL arthroscopy     No Known Allergies Prior to Admission medications   Medication Sig Start Date End Date Taking? Authorizing Provider  clonazePAM (KLONOPIN) 0.5 MG tablet TAKE 1 TABLET BY MOUTH TWICE DAILY AS NEEDED FOR ANXIETY 04/19/16  Yes Wendie Agreste, MD  Multiple Vitamin (MULTIVITAMIN) tablet Take 1 tablet by mouth daily. Reported on 05/25/2015   Yes Historical Provider, MD   Social History   Social  History  . Marital status: Married    Spouse name: N/A  . Number of children: N/A  . Years of education: N/A   Occupational History  . Not on file.   Social History Main Topics  . Smoking status: Current Every Day Smoker    Packs/day: 0.50    Years: 10.00    Types: Cigarettes  . Smokeless tobacco: Never Used  . Alcohol use 0.0 oz/week     Comment: beer - only on ocassions  . Drug use: No  . Sexual activity: Yes   Other Topics Concern  . Not on file   Social History Narrative  . No narrative on file    There are no active problems to display for this patient.  Past Medical History:  Diagnosis Date  . Anxiety   . IBS (irritable bowel syndrome)    Past Surgical History:  Procedure Laterality Date  . left ACL arthroscopy     No Known Allergies Prior to Admission medications   Medication Sig Start Date End Date Taking? Authorizing Provider  clonazePAM (KLONOPIN) 0.5 MG tablet TAKE 1 TABLET BY MOUTH TWICE DAILY AS NEEDED FOR ANXIETY 04/19/16  Yes Wendie Agreste, MD  Multiple Vitamin (MULTIVITAMIN) tablet Take 1 tablet by mouth daily. Reported on 05/25/2015   Yes Historical Provider, MD   Social History   Social History  . Marital status: Married    Spouse name: N/A  . Number of  children: N/A  . Years of education: N/A   Occupational History  . Not on file.   Social History Main Topics  . Smoking status: Current Every Day Smoker    Packs/day: 0.50    Years: 10.00    Types: Cigarettes  . Smokeless tobacco: Never Used  . Alcohol use 0.0 oz/week     Comment: beer - only on ocassions  . Drug use: No  . Sexual activity: Yes   Other Topics Concern  . Not on file   Social History Narrative  . No narrative on file    Review of Systems  Psychiatric/Behavioral: Positive for agitation. Negative for dysphoric mood, self-injury and suicidal ideas. The patient is nervous/anxious.        Objective:   Physical Exam  Constitutional: He is oriented to person,  place, and time. He appears well-developed and well-nourished.  HENT:  Head: Normocephalic and atraumatic.  Eyes: EOM are normal. Pupils are equal, round, and reactive to light.  Neck: No JVD present. Carotid bruit is not present.  Cardiovascular: Normal rate, regular rhythm and normal heart sounds.   No murmur heard. Pulmonary/Chest: Effort normal and breath sounds normal. He has no rales.  Musculoskeletal: He exhibits no edema.  Neurological: He is alert and oriented to person, place, and time.  Skin: Skin is warm and dry.  Psychiatric: He has a normal mood and affect.   Vitals:   06/21/16 0927  BP: 124/80  Pulse: 74  Resp: 17  Temp: 98.6 F (37 C)  TempSrc: Oral  SpO2: 97%  Weight: 216 lb (98 kg)  Height: '6\' 3"'$  (1.905 m)        Assessment & Plan:   Micheal Roberts is a 46 y.o. male GAD (generalized anxiety disorder) - Plan: clonazePAM (KLONOPIN) 0.5 MG tablet, buPROPion (WELLBUTRIN SR) 150 MG 12 hr tablet Anxiety - Plan: clonazePAM (KLONOPIN) 0.5 MG tablet  - Generalized anxiety with situational stressors and some possible prolonged grief with loss of daughter. Still requiring Klonopin few times per week.  -start Wellbutrin SR 159 g daily 3 days, then increase to twice a day. This should also help with smoking cessation. Klonopin refilled if needed, but goal of decreasing use discussed.  -Phone numbers given again for counseling, as may benefit with at least 1-2 sessions.  Need for prophylactic vaccination and inoculation against influenza - Plan: Flu Vaccine QUAD 36+ mos IM, CANCELED: Flu Vaccine QUAD 36+ mos IM  - Flu vaccine given  Tobacco abuse - Plan: buPROPion (WELLBUTRIN SR) 150 MG 12 hr tablet  - Resources provided, cessation discussed, Wellbutrin 150 mg twice a day as above for anxiety symptoms, but may also help with smoking cessation. Initial possible side effects discussed, RTC precautions.  Recheck in 3-6 months for a physical, sooner if needed, and update  by my chart next few weeks.   Meds ordered this encounter  Medications  . clonazePAM (KLONOPIN) 0.5 MG tablet    Sig: Take 1 tablet (0.5 mg total) by mouth 2 (two) times daily as needed. for anxiety    Dispense:  60 tablet    Refill:  0  . buPROPion (WELLBUTRIN SR) 150 MG 12 hr tablet    Sig: Take 1 tablet (150 mg total) by mouth 2 (two) times daily.    Dispense:  60 tablet    Refill:  5   Patient Instructions   Start Wellbutrin '150mg'$  once per day for 3 days, then increase to twice per day.  This should  lessen cravings for smoking as well as anxiety symptoms.  Ok to use klonopin if needed, but this medicine should lessen need.   [facility] offers smoking cessation clinics. Registration is required. To register call [phone] or register online at [email or web address]  I encourage you to meet with therapist to discuss anxiety and ways to manage those symptoms.  Vivia Budge: [phone] Arvil Chaco: [phone]  Follow up in next 6 months for physical.   Return to the clinic or go to the nearest emergency room if any of your symptoms worsen or new symptoms occur.   Stress and Stress Management Stress is a normal reaction to life events. It is what you feel when life demands more than you are used to or more than you can handle. Some stress can be useful. For example, the stress reaction can help you catch the last bus of the day, study for a test, or meet a deadline at work. But stress that occurs too often or for too long can cause problems. It can affect your emotional health and interfere with relationships and normal daily activities. Too much stress can weaken your immune system and increase your risk for physical illness. If you already have a medical problem, stress can make it worse. What are the causes? All sorts of life events may cause stress. An event that causes stress for one person may not be stressful for another person. Major life events commonly cause stress. These may be  positive or negative. Examples include losing your job, moving into a new home, getting married, having a baby, or losing a loved one. Less obvious life events may also cause stress, especially if they occur day after day or in combination. Examples include working long hours, driving in traffic, caring for children, being in debt, or being in a difficult relationship. What are the signs or symptoms? Stress may cause emotional symptoms including, the following:  Anxiety. This is feeling worried, afraid, on edge, overwhelmed, or out of control.  Anger. This is feeling irritated or impatient.  Depression. This is feeling sad, down, helpless, or guilty.  Difficulty focusing, remembering, or making decisions. Stress may cause physical symptoms, including the following:  Aches and pains. These may affect your head, neck, back, stomach, or other areas of your body.  Tight muscles or clenched jaw.  Low energy or trouble sleeping. Stress may cause unhealthy behaviors, including the following:  Eating to feel better (overeating) or skipping meals.  Sleeping too little, too much, or both.  Working too much or putting off tasks (procrastination).  Smoking, drinking alcohol, or using drugs to feel better. How is this diagnosed? Stress is diagnosed through an assessment by your health care provider. Your health care provider will ask questions about your symptoms and any stressful life events.Your health care provider will also ask about your medical history and may order blood tests or other tests. Certain medical conditions and medicine can cause physical symptoms similar to stress. Mental illness can cause emotional symptoms and unhealthy behaviors similar to stress. Your health care provider may refer you to a mental health professional for further evaluation. How is this treated? Stress management is the recommended treatment for stress.The goals of stress management are reducing stressful  life events and coping with stress in healthy ways. Techniques for reducing stressful life events include the following:  Stress identification. Self-monitor for stress and identify what causes stress for you. These skills may help you to avoid some stressful events.  Time management.  Set your priorities, keep a calendar of events, and learn to say "no." These tools can help you avoid making too many commitments. Techniques for coping with stress include the following:  Rethinking the problem. Try to think realistically about stressful events rather than ignoring them or overreacting. Try to find the positives in a stressful situation rather than focusing on the negatives.  Exercise. Physical exercise can release both physical and emotional tension. The key is to find a form of exercise you enjoy and do it regularly.  Relaxation techniques. These relax the body and mind. Examples include yoga, meditation, tai chi, biofeedback, deep breathing, progressive muscle relaxation, listening to music, being out in nature, journaling, and other hobbies. Again, the key is to find one or more that you enjoy and can do regularly.  Healthy lifestyle. Eat a balanced diet, get plenty of sleep, and do not smoke. Avoid using alcohol or drugs to relax.  Strong support network. Spend time with family, friends, or other people you enjoy being around.Express your feelings and talk things over with someone you trust. Counseling or talktherapy with a mental health professional may be helpful if you are having difficulty managing stress on your own. Medicine is typically not recommended for the treatment of stress.Talk to your health care provider if you think you need medicine for symptoms of stress. Follow these instructions at home:  Keep all follow-up visits as directed by your health care provider.  Take all medicines as directed by your health care provider. Contact a health care provider if:  Your symptoms  get worse or you start having new symptoms.  You feel overwhelmed by your problems and can no longer manage them on your own. Get help right away if:  You feel like hurting yourself or someone else. This information is not intended to replace advice given to you by your health care provider. Make sure you discuss any questions you have with your health care provider. Document Released: 09/28/2000 Document Revised: 09/10/2015 Document Reviewed: 11/27/2012 Elsevier Interactive Patient Education  2017 [employer].    IF you received an x-ray today, you will receive an invoice from [facility]. Please contact [facility] at [phone] with questions or concerns regarding your invoice.   IF you received labwork today, you will receive an invoice from [facility]. Please contact [facility] at [phone] with questions or concerns regarding your invoice.   Our billing staff will not be able to assist you with questions regarding bills from these companies.  You will be contacted with the lab results as soon as they are available. The fastest way to get your results is to activate your My Chart account. Instructions are located on the last page of this paperwork. If you have not heard from [place] regarding the results in 2 weeks, please contact this office.      Signed,   Merri Ray, MD [facility].  06/21/16 10:30 AM

## 2016-09-05 ENCOUNTER — Other Ambulatory Visit: Payer: Self-pay | Admitting: Family Medicine

## 2016-09-05 DIAGNOSIS — F419 Anxiety disorder, unspecified: Secondary | ICD-10-CM

## 2016-09-05 DIAGNOSIS — F411 Generalized anxiety disorder: Secondary | ICD-10-CM

## 2016-09-06 ENCOUNTER — Other Ambulatory Visit: Payer: Self-pay | Admitting: Family Medicine

## 2016-09-06 DIAGNOSIS — F419 Anxiety disorder, unspecified: Secondary | ICD-10-CM

## 2016-09-06 DIAGNOSIS — F411 Generalized anxiety disorder: Secondary | ICD-10-CM

## 2016-09-06 NOTE — Telephone Encounter (Signed)
Pt left message on referrals vm yesterday requesting refill on clonzePAM. Pt callback number is [phone].

## 2016-09-06 NOTE — Telephone Encounter (Signed)
06/21/16 last ov and refill

## 2016-09-06 NOTE — Telephone Encounter (Signed)
See refill request pending

## 2016-09-07 NOTE — Telephone Encounter (Signed)
PT CALLING ABOUT REFILL ON CLONZEPAM

## 2016-09-07 NOTE — Telephone Encounter (Signed)
06/2016 last refill

## 2016-09-08 NOTE — Telephone Encounter (Signed)
Adv pt Rx refill is still pending and the time frame for refills is 48-72 hours.

## 2016-09-09 NOTE — Telephone Encounter (Signed)
PT CALLING A BOUT MEDICINE HE'S GOING OUT OF TOWN THIS WEEKEND

## 2016-09-10 MED ORDER — CLONAZEPAM 0.5 MG PO TABS: 0.5000 mg | ORAL_TABLET | Freq: Two times a day (BID) | ORAL | 0 refills | Status: DC | PRN

## 2016-09-10 NOTE — Telephone Encounter (Signed)
Faxed prescription

## 2016-09-10 NOTE — Telephone Encounter (Signed)
Refilled for #30 as should be requiring less if Wellbutrin is helping anxiety.  If still requiring similar frequency, return to discuss Wellbutrin dosing.

## 2016-09-13 NOTE — Telephone Encounter (Signed)
Called to [facility] 

## 2016-12-05 ENCOUNTER — Ambulatory Visit (INDEPENDENT_AMBULATORY_CARE_PROVIDER_SITE_OTHER): Payer: BC Managed Care – PPO | Admitting: Family Medicine

## 2016-12-05 ENCOUNTER — Ambulatory Visit (INDEPENDENT_AMBULATORY_CARE_PROVIDER_SITE_OTHER): Payer: BC Managed Care – PPO

## 2016-12-05 ENCOUNTER — Encounter: Payer: Self-pay | Admitting: Family Medicine

## 2016-12-05 VITALS — BP 134/95 | HR 90 | Temp 98.2°F | Resp 18 | Ht 73.23 in | Wt 216.8 lb

## 2016-12-05 DIAGNOSIS — R079 Chest pain, unspecified: Secondary | ICD-10-CM | POA: Diagnosis not present

## 2016-12-05 DIAGNOSIS — Z720 Tobacco use: Secondary | ICD-10-CM | POA: Diagnosis not present

## 2016-12-05 DIAGNOSIS — Z1329 Encounter for screening for other suspected endocrine disorder: Secondary | ICD-10-CM | POA: Diagnosis not present

## 2016-12-05 DIAGNOSIS — Z13220 Encounter for screening for lipoid disorders: Secondary | ICD-10-CM | POA: Diagnosis not present

## 2016-12-05 DIAGNOSIS — F17210 Nicotine dependence, cigarettes, uncomplicated: Secondary | ICD-10-CM | POA: Diagnosis not present

## 2016-12-05 DIAGNOSIS — Z0000 Encounter for general adult medical examination without abnormal findings: Secondary | ICD-10-CM

## 2016-12-05 DIAGNOSIS — Z114 Encounter for screening for human immunodeficiency virus [HIV]: Secondary | ICD-10-CM | POA: Diagnosis not present

## 2016-12-05 DIAGNOSIS — F419 Anxiety disorder, unspecified: Secondary | ICD-10-CM

## 2016-12-05 DIAGNOSIS — F411 Generalized anxiety disorder: Secondary | ICD-10-CM | POA: Diagnosis not present

## 2016-12-05 DIAGNOSIS — Z131 Encounter for screening for diabetes mellitus: Secondary | ICD-10-CM

## 2016-12-05 MED ORDER — CLONAZEPAM 0.5 MG PO TABS: 0.5000 mg | ORAL_TABLET | Freq: Two times a day (BID) | ORAL | 0 refills | Status: DC | PRN

## 2016-12-05 MED ORDER — FLUOXETINE HCL 20 MG PO TABS: 20.0000 mg | ORAL_TABLET | Freq: Every day | ORAL | 1 refills | Status: DC

## 2016-12-05 NOTE — Patient Instructions (Addendum)
I would consider Chantix as quitting smoking is probably the most important thing for your health at this time. Let me know if you want to start that medicine.  [facility] offers smoking cessation clinics. Registration is required. To register call [phone] or register online at [email or web address]  I would consider another SSRI especially if persistent anxiety and meeting with a therapist.  Here are a few numbers, let me know if others needed.   I do not have a record of your last tdap - get me a copy of that for your record.   I will refer you for cardiology evaluation of your chest pain.  Return to the clinic or go to the nearest emergency room/call 911 if any of your symptoms worsen or new symptoms occur.   Nonspecific Chest Pain Chest pain can be caused by many different conditions. There is always a chance that your pain could be related to something serious, such as a heart attack or a blood clot in your lungs. Chest pain can also be caused by conditions that are not life-threatening. If you have chest pain, it is very important to follow up with your health care provider. What are the causes? Causes of this condition include:  Heartburn.  Pneumonia or bronchitis.  Anxiety or stress.  Inflammation around your heart (pericarditis) or lung (pleuritis or pleurisy).  A blood clot in your lung.  A collapsed lung (pneumothorax). This can develop suddenly on its own (spontaneous pneumothorax) or from trauma to the chest.  Shingles infection (varicella-zoster virus).  Heart attack.  Damage to the bones, muscles, and cartilage that make up your chest wall. This can include: ? Bruised bones due to injury. ? Strained muscles or cartilage due to frequent or repeated coughing or overwork. ? Fracture to one or more ribs. ? Sore cartilage due to inflammation (costochondritis).  What increases the risk? Risk factors for this condition may include:  Activities that increase your  risk for trauma or injury to your chest.  Respiratory infections or conditions that cause frequent coughing.  Medical conditions or overeating that can cause heartburn.  Heart disease or family history of heart disease.  Conditions or health behaviors that increase your risk of developing a blood clot.  Having had chicken pox (varicella zoster).  What are the signs or symptoms? Chest pain can feel like:  Burning or tingling on the surface of your chest or deep in your chest.  Crushing, pressure, aching, or squeezing pain.  Dull or sharp pain that is worse when you move, cough, or take a deep breath.  Pain that is also felt in your back, neck, shoulder, or arm, or pain that spreads to any of these areas.  Your chest pain may come and go, or it may stay constant. How is this diagnosed? Lab tests or other studies may be needed to find the cause of your pain. Your health care provider may have you take a test called an ECG (electrocardiogram). An ECG records your heartbeat patterns at the time the test is performed. You may also have other tests, such as:  Transthoracic echocardiogram (TTE). In this test, sound waves are used to create a picture of the heart structures and to look at how blood flows through your heart.  Transesophageal echocardiogram (TEE).This is a more advanced imaging test that takes images from inside your body. It allows your health care provider to see your heart in finer detail.  Cardiac monitoring. This allows your health care provider to  monitor your heart rate and rhythm in real time.  Holter monitor. This is a portable device that records your heartbeat and can help to diagnose abnormal heartbeats. It allows your health care provider to track your heart activity for several days, if needed.  Stress tests. These can be done through exercise or by taking medicine that makes your heart beat more quickly.  Blood tests.  Other imaging tests.  How is this  treated? Treatment depends on what is causing your chest pain. Treatment may include:  Medicines. These may include: ? Acid blockers for heartburn. ? Anti-inflammatory medicine. ? Pain medicine for inflammatory conditions. ? Antibiotic medicine, if an infection is present. ? Medicines to dissolve blood clots. ? Medicines to treat coronary artery disease (CAD).  Supportive care for conditions that do not require medicines. This may include: ? Resting. ? Applying heat or cold packs to injured areas. ? Limiting activities until pain decreases.  Follow these instructions at home: Medicines  If you were prescribed an antibiotic, take it as told by your health care provider. Do not stop taking the antibiotic even if you start to feel better.  Take over-the-counter and prescription medicines only as told by your health care provider. Lifestyle  Do not use any products that contain nicotine or tobacco, such as cigarettes and e-cigarettes. If you need help quitting, ask your health care provider.  Do not drink alcohol.  Make lifestyle changes as directed by your health care provider. These may include: ? Getting regular exercise. Ask your health care provider to suggest some activities that are safe for you. ? Eating a heart-healthy diet. A registered dietitian can help you to learn healthy eating options. ? Maintaining a healthy weight. ? Managing diabetes, if necessary. ? Reducing stress, such as with yoga or relaxation techniques. General instructions  Avoid any activities that bring on chest pain.  If heartburn is the cause for your chest pain, raise (elevate) the head of your bed about 6 inches (15 cm) by putting blocks under the legs. Sleeping with more pillows does not effectively relieve heartburn because it only changes the position of your head.  Keep all follow-up visits as told by your health care provider. This is important. This includes any further testing if your chest  pain does not go away. Contact a health care provider if:  Your chest pain does not go away.  You have a rash with blisters on your chest.  You have a fever.  You have chills. Get help right away if:  Your chest pain is worse.  You have a cough that gets worse, or you cough up blood.  You have severe pain in your abdomen.  You have severe weakness.  You faint.  You have sudden, unexplained chest discomfort.  You have sudden, unexplained discomfort in your arms, back, neck, or jaw.  You have shortness of breath at any time.  You suddenly start to sweat, or your skin gets clammy.  You feel nauseous or you vomit.  You suddenly feel light-headed or dizzy.  Your heart begins to beat quickly, or it feels like it is skipping beats. These symptoms may represent a serious problem that is an emergency. Do not wait to see if the symptoms will go away. Get medical help right away. Call your local emergency services (911 in the [place] Do not drive yourself to the hospital. This information is not intended to replace advice given to you by your health care provider. Make sure you  discuss any questions you have with your health care provider. Document Released: 01/12/2005 Document Revised: 12/28/2015 Document Reviewed: 12/28/2015 Elsevier Interactive Patient Education  2017 [employer].   Steps to Quit Smoking Smoking tobacco can be bad for your health. It can also affect almost every organ in your body. Smoking puts you and people around you at risk for many serious long-lasting (chronic) diseases. Quitting smoking is hard, but it is one of the best things that you can do for your health. It is never too late to quit. What are the benefits of quitting smoking? When you quit smoking, you lower your risk for getting serious diseases and conditions. They can include:  Lung cancer or lung disease.  Heart disease.  Stroke.  Heart attack.  Not being able to have children  (infertility).  Weak bones (osteoporosis) and broken bones (fractures).  If you have coughing, wheezing, and shortness of breath, those symptoms may get better when you quit. You may also get sick less often. If you are pregnant, quitting smoking can help to lower your chances of having a baby of low birth weight. What can I do to help me quit smoking? Talk with your doctor about what can help you quit smoking. Some things you can do (strategies) include:  Quitting smoking totally, instead of slowly cutting back how much you smoke over a period of time.  Going to in-person counseling. You are more likely to quit if you go to many counseling sessions.  Using resources and support systems, such as: ? Database administrator with a Social worker. ? Phone quitlines. ? Careers information officer. ? Support groups or group counseling. ? Text messaging programs. ? Mobile phone apps or applications.  Taking medicines. Some of these medicines may have nicotine in them. If you are pregnant or breastfeeding, do not take any medicines to quit smoking unless your doctor says it is okay. Talk with your doctor about counseling or other things that can help you.  Talk with your doctor about using more than one strategy at the same time, such as taking medicines while you are also going to in-person counseling. This can help make quitting easier. What things can I do to make it easier to quit? Quitting smoking might feel very hard at first, but there is a lot that you can do to make it easier. Take these steps:  Talk to your family and friends. Ask them to support and encourage you.  Call phone quitlines, reach out to support groups, or work with a Social worker.  Ask people who smoke to not smoke around you.  Avoid places that make you want (trigger) to smoke, such as: ? Bars. ? Parties. ? Smoke-break areas at work.  Spend time with people who do not smoke.  Lower the stress in your life. Stress can make you  want to smoke. Try these things to help your stress: ? Getting regular exercise. ? Deep-breathing exercises. ? Yoga. ? Meditating. ? Doing a body scan. To do this, close your eyes, focus on one area of your body at a time from head to toe, and notice which parts of your body are tense. Try to relax the muscles in those areas.  Download or buy apps on your mobile phone or tablet that can help you stick to your quit plan. There are many free apps, such as QuitGuide from the [employer] Office manager for Disease Control and Prevention). You can find more support from smokefree.gov and other websites.  This information is  not intended to replace advice given to you by your health care provider. Make sure you discuss any questions you have with your health care provider. Document Released: 01/29/2009 Document Revised: 12/01/2015 Document Reviewed: 08/19/2014 Elsevier Interactive Patient Education  2018 [place] you healthy  Get these tests  Blood pressure- Have your blood pressure checked once a year by your healthcare provider.  Normal blood pressure is 120/80.  Weight- Have your body mass index (BMI) calculated to screen for obesity.  BMI is a measure of body fat based on height and weight. You can also calculate your own BMI at [employer].it.  Cholesterol- Have your cholesterol checked regularly starting at age 10, sooner may be necessary if you have diabetes, high blood pressure, if a family member developed heart diseases at an early age or if you smoke.   Chlamydia, HIV, and other sexual transmitted disease- Get screened each year until the age of 32 then within three months of each new sexual partner.  Diabetes- Have your blood sugar checked regularly if you have high blood pressure, high cholesterol, a family history of diabetes or if you are overweight.  Get these vaccines  Flu shot- Every fall.  Tetanus shot- Every 10 years.  Menactra- Single dose; prevents  meningitis.  Take these steps  Don't smoke- If you do smoke, ask your healthcare provider about quitting. For tips on how to quit, go to www.smokefree.gov or call 1-800-QUIT-NOW.  Be physically active- Exercise 5 days a week for at least 30 minutes.  If you are not already physically active start slow and gradually work up to 30 minutes of moderate physical activity.  Examples of moderate activity include walking briskly, mowing the yard, dancing, swimming bicycling, etc.  Eat a healthy diet- Eat a variety of healthy foods such as fruits, vegetables, low fat milk, low fat cheese, yogurt, lean meats, poultry, fish, beans, tofu, etc.  For more information on healthy eating, go to www.thenutritionsource.org  Drink alcohol in moderation- Limit alcohol intake two drinks or less a day.  Never drink and drive.  Dentist- Brush and floss teeth twice daily; visit your dentis twice a year.  Depression-Your emotional health is as important as your physical health.  If you're feeling down, losing interest in things you normally enjoy please talk with your healthcare provider.  Gun Safety- If you keep a gun in your home, keep it unloaded and with the safety lock on.  Bullets should be stored separately.  Helmet use- Always wear a helmet when riding a motorcycle, bicycle, rollerblading or skateboarding.  Safe sex- If you may be exposed to a sexually transmitted infection, use a condom  Seat belts- Seat bels can save your life; always wear one.  Smoke/Carbon Monoxide detectors- These detectors need to be installed on the appropriate level of your home.  Replace batteries at least once a year.  Skin Cancer- When out in the sun, cover up and use sunscreen SPF 15 or higher.  Violence- If anyone is threatening or hurting you, please tell your healthcare provider.   IF you received an x-ray today, you will receive an invoice from [facility]. Please contact [facility] at [phone]  with questions or concerns regarding your invoice.   IF you received labwork today, you will receive an invoice from [facility]. Please contact [facility] at [phone] with questions or concerns regarding your invoice.   Our billing staff will not be able to assist you with questions regarding bills from these companies.  You will be contacted with the lab results as soon as they are available. The fastest way to get your results is to activate your My Chart account. Instructions are located on the last page of this paperwork. If you have not heard from [place] regarding the results in 2 weeks, please contact this office.

## 2016-12-05 NOTE — Progress Notes (Signed)
Subjective:  By signing my name below, I, Essence Howell, attest that this documentation has been prepared under the direction and in the presence of Wendie Agreste, MD Electronically Signed: Ladene Artist, ED Scribe 12/05/2016 at 9:49 AM.   Patient ID: Micheal Roberts, male    DOB: 19-Jul-1970, 46 y.o.   MRN: [id number]  Chief Complaint  Patient presents with  . Annual Exam   HPI Micheal Roberts is a 46 y.o. male who presents to Primary Care at [facility] for an annual exam. H/o anxiety, IBS and tobacco abuse. Pt is fasting at this visit.  Anxiety Takes Klonopin a few times/week. Tried Zoloft 50 mg in the past. Complained of sedation even after taking this for 3 weeks. Denied sedation with Klonopin. In March we tried Wellbutrin 150 mg, initially qd and then to increase to bid as he was also trying to quit smoking. Continued Klonopin 0.5 mg and counseling recommended with phone numbers provided.   Pt has noticed increased anxiety with Wellbutrin once daily with very minimal sedation. Reports anxiety is triggered by a "strong sinus pressure". Denies HA, fever. He states that he has only been taking Klonopin once every 3 days and just "dealing with his anxiety". Pt also admits to not meeting with counseling which was provided at last visit but insists that he will follow-up after this visit. Pt has not tried Prozac in the past.  CA Screening No family h/o CA. Prostate CA Screening: Lab Results  Component Value Date   PSA 0.25 07/25/2013   Immunizations Immunization History  Administered Date(s) Administered  . Influenza,inj,Quad PF,36+ Mos 05/25/2015, 06/21/2016  Tetanus: Pt reports that his last tdap was 4 years ago when he was working for [employer].  STI Screening: Pt declines testing at this time. He has had a negative HIV screening approximately 7-8 years ago.  Depression Screening Depression screen [facility] Show Low 2/9 12/05/2016 06/21/2016 12/01/2015 05/25/2015 07/25/2013  Decreased Interest 0 0 0 0  0  Down, Depressed, Hopeless 0 0 0 0 0  PHQ - 2 Score 0 0 0 0 0    Visual Acuity Screening   Right eye Left eye Both eyes  Without correction: 20/20 20/20-1 20/13-2  With correction:      Vision: Has not seen an eye doctor in a while. Dentist: Pt does not currently have a dentist.  Exercise: Once a week.  Tobacco Abuse  Wellbutrin prescribed previous visit. Pt states that he quit for approximately 1 week but restarted. Has not been able to attend [facility] at [facility] due to his work schedule. He is currently smoking 0.5 ppd. Pt has not tried Chantix but is concerned of side-effects of mood swings. Denies cough, hemoptysis, unexplained weight loss.   Chest Pain Pt reports intermittent right-sided chest pain but predominantly left-sided chest pain over the past 6 months. Pt describes chest pain as a pulling sensation that he has noticed while working and lifting but occasionally at rest. Pain improves with rest. He reports associated symptoms of bilateral arm weakness and occasional sob. Denies diaphoresis, nausea, vomiting, chest tightness with walking up stairs, increased chest pain with palpation. He takes Bayer aspirin almost daily. Pt has not been seen by cardiology. No family h/o MI.   There are no active problems to display for this patient.  Past Medical History:  Diagnosis Date  . Anxiety   . IBS (irritable bowel syndrome)    Past Surgical History:  Procedure Laterality Date  . left ACL arthroscopy  No Known Allergies Prior to Admission medications   Medication Sig Start Date End Date Taking? Authorizing Provider  buPROPion (WELLBUTRIN SR) 150 MG 12 hr tablet Take 1 tablet (150 mg total) by mouth 2 (two) times daily. 06/21/16   Wendie Agreste, MD  clonazePAM (KLONOPIN) 0.5 MG tablet Take 1 tablet (0.5 mg total) by mouth 2 (two) times daily as needed. for anxiety 09/10/16   Wendie Agreste, MD  Multiple Vitamin (MULTIVITAMIN) tablet Take 1 tablet by mouth  daily. Reported on 05/25/2015    [provider]   Social History   Social History  . Marital status: Married    Spouse name: N/A  . Number of children: N/A  . Years of education: N/A   Occupational History  . Not on file.   Social History Main Topics  . Smoking status: Current Every Day Smoker    Packs/day: 0.50    Years: 10.00    Types: Cigarettes  . Smokeless tobacco: Never Used  . Alcohol use 0.0 oz/week     Comment: beer - only on ocassions  . Drug use: No  . Sexual activity: Yes   Other Topics Concern  . Not on file   Social History Narrative  . No narrative on file   Review of Systems  Constitutional: Negative for diaphoresis and fever.  Respiratory: Positive for shortness of breath (occasional). Negative for chest tightness.   Cardiovascular: Positive for chest pain.  Gastrointestinal: Negative for nausea and vomiting.  Neurological: Negative for headaches.  Psychiatric/Behavioral: The patient is nervous/anxious.       Objective:   Physical Exam  Constitutional: He is oriented to person, place, and time. He appears well-developed and well-nourished.  HENT:  Head: Normocephalic and atraumatic.  Right Ear: External ear normal.  Left Ear: External ear normal.  Mouth/Throat: Oropharynx is clear and moist.  Eyes: Pupils are equal, round, and reactive to light. Conjunctivae and EOM are normal.  Neck: Normal range of motion. Neck supple. No thyromegaly present.  Cardiovascular: Normal rate, regular rhythm, normal heart sounds and intact distal pulses.   Pulmonary/Chest: Effort normal and breath sounds normal. No respiratory distress. He has no wheezes. He exhibits no tenderness.  Chest wall is non-tender.   Abdominal: Soft. He exhibits no distension. There is no tenderness.  Musculoskeletal: Normal range of motion. He exhibits no edema or tenderness.  Lymphadenopathy:    He has no cervical adenopathy.  Neurological: He is alert and oriented to person,  place, and time. He has normal reflexes.  Skin: Skin is warm and dry.  Psychiatric: He has a normal mood and affect. His behavior is normal.  Vitals reviewed.   Vitals:   12/05/16 0937  BP: (!) 134/95  Pulse: 90  Resp: 18  Temp: 98.2 F (36.8 C)  TempSrc: Oral  SpO2: 96%  Weight: 216 lb 12.8 oz (98.3 kg)  Height: 6' 1.23" (1.86 m)   Dg Chest 2 View  Result Date: 12/05/2016 CLINICAL DATA:  Intermittent chest pain.  Current smoker. EXAM: CHEST  2 VIEW COMPARISON:  Chest x-ray of July 17, 2008 FINDINGS: The lungs are adequately inflated. There is no focal infiltrate. There are no abnormal pulmonary parenchymal nodules or masses. The heart and pulmonary vascularity are normal. The mediastinum is normal in width. The bony thorax exhibits no acute abnormality. IMPRESSION: There is no acute cardiopulmonary abnormality. Electronically Signed   By: David  Martinique M.D.   On: 12/05/2016 10:31   Approximately 5 minutes of discussion  regarding tobacco abuse and resources.   EKG: Sinus rhythm, prominent R in V1, right axis. No prior EKG available for review.    Assessment & Plan:    Rhyker Silversmith is a 46 y.o. male Annual physical exam  - -anticipatory guidance as below in AVS, screening labs above. Health maintenance items as above in HPI discussed/recommended as applicable.   Anxiety GAD (generalized anxiety disorder) - Plan: TSH, FLUoxetine (PROZAC) 20 MG tablet, clonazePAM (KLONOPIN) 0.5 MG tablet  - Try Prozac, potential side effects discussed, including initial 1-2 weeks. Klonopin still provided as needed for breakthrough symptoms. 3-4 weeks.  Encounter for screening for HIV - HIV test, low risk, routine  Tobacco abuse - Plan: DG Chest 2 View, Ambulatory referral to Cardiology  - Potential impacts on health discussed, tobacco cessation discussed including previous attempts and reason for recurrence, discussed Chantix, he would like to look up more on that medication. Potential side  effects were discussed. Phone number for smoking cessation resources provided.  Screening for thyroid disorder - check TSH  Screening for diabetes mellitus - Plan: Comprehensive metabolic panel  Screening for hyperlipidemia - Plan: Lipid panel  Chest pain, unspecified type - Plan: EKG 12-Lead, DG Chest 2 View, Ambulatory referral to Cardiology  - Episodic pain, atypical, some features of musculoskeletal pain with activity, but does have risk factors of tobacco abuse, and age. Referral placed cardiology, RTC/ER precautions if worsening pain.  Meds ordered this encounter  Medications  . FLUoxetine (PROZAC) 20 MG tablet    Sig: Take 1 tablet (20 mg total) by mouth daily.    Dispense:  30 tablet    Refill:  1  . clonazePAM (KLONOPIN) 0.5 MG tablet    Sig: Take 1 tablet (0.5 mg total) by mouth 2 (two) times daily as needed. for anxiety    Dispense:  30 tablet    Refill:  0   Patient Instructions    I would consider Chantix as quitting smoking is probably the most important thing for your health at this time. Let me know if you want to start that medicine.  [place] offers smoking cessation clinics. Registration is required. To register call [phone] or register online at [email or web address]  I would consider another SSRI especially if persistent anxiety and meeting with a therapist.  Here are a few numbers, let me know if others needed.   I do not have a record of your last tdap - get me a copy of that for your record.   I will refer you for cardiology evaluation of your chest pain.  Return to the clinic or go to the nearest emergency room/call 911 if any of your symptoms worsen or new symptoms occur.   Nonspecific Chest Pain Chest pain can be caused by many different conditions. There is always a chance that your pain could be related to something serious, such as a heart attack or a blood clot in your lungs. Chest pain can also be caused by conditions that are not  life-threatening. If you have chest pain, it is very important to follow up with your health care provider. What are the causes? Causes of this condition include:  Heartburn.  Pneumonia or bronchitis.  Anxiety or stress.  Inflammation around your heart (pericarditis) or lung (pleuritis or pleurisy).  A blood clot in your lung.  A collapsed lung (pneumothorax). This can develop suddenly on its own (spontaneous pneumothorax) or from trauma to the chest.  Shingles infection (varicella-zoster virus).  Heart attack.  Damage to the bones, muscles, and cartilage that make up your chest wall. This can include: ? Bruised bones due to injury. ? Strained muscles or cartilage due to frequent or repeated coughing or overwork. ? Fracture to one or more ribs. ? Sore cartilage due to inflammation (costochondritis).  What increases the risk? Risk factors for this condition may include:  Activities that increase your risk for trauma or injury to your chest.  Respiratory infections or conditions that cause frequent coughing.  Medical conditions or overeating that can cause heartburn.  Heart disease or family history of heart disease.  Conditions or health behaviors that increase your risk of developing a blood clot.  Having had chicken pox (varicella zoster).  What are the signs or symptoms? Chest pain can feel like:  Burning or tingling on the surface of your chest or deep in your chest.  Crushing, pressure, aching, or squeezing pain.  Dull or sharp pain that is worse when you move, cough, or take a deep breath.  Pain that is also felt in your back, neck, shoulder, or arm, or pain that spreads to any of these areas.  Your chest pain may come and go, or it may stay constant. How is this diagnosed? Lab tests or other studies may be needed to find the cause of your pain. Your health care provider may have you take a test called an ECG (electrocardiogram). An ECG records your  heartbeat patterns at the time the test is performed. You may also have other tests, such as:  Transthoracic echocardiogram (TTE). In this test, sound waves are used to create a picture of the heart structures and to look at how blood flows through your heart.  Transesophageal echocardiogram (TEE).This is a more advanced imaging test that takes images from inside your body. It allows your health care provider to see your heart in finer detail.  Cardiac monitoring. This allows your health care provider to monitor your heart rate and rhythm in real time.  Holter monitor. This is a portable device that records your heartbeat and can help to diagnose abnormal heartbeats. It allows your health care provider to track your heart activity for several days, if needed.  Stress tests. These can be done through exercise or by taking medicine that makes your heart beat more quickly.  Blood tests.  Other imaging tests.  How is this treated? Treatment depends on what is causing your chest pain. Treatment may include:  Medicines. These may include: ? Acid blockers for heartburn. ? Anti-inflammatory medicine. ? Pain medicine for inflammatory conditions. ? Antibiotic medicine, if an infection is present. ? Medicines to dissolve blood clots. ? Medicines to treat coronary artery disease (CAD).  Supportive care for conditions that do not require medicines. This may include: ? Resting. ? Applying heat or cold packs to injured areas. ? Limiting activities until pain decreases.  Follow these instructions at home: Medicines  If you were prescribed an antibiotic, take it as told by your health care provider. Do not stop taking the antibiotic even if you start to feel better.  Take over-the-counter and prescription medicines only as told by your health care provider. Lifestyle  Do not use any products that contain nicotine or tobacco, such as cigarettes and e-cigarettes. If you need help quitting, ask  your health care provider.  Do not drink alcohol.  Make lifestyle changes as directed by your health care provider. These may include: ? Getting regular exercise. Ask your health care provider to suggest some  activities that are safe for you. ? Eating a heart-healthy diet. A registered dietitian can help you to learn healthy eating options. ? Maintaining a healthy weight. ? Managing diabetes, if necessary. ? Reducing stress, such as with yoga or relaxation techniques. General instructions  Avoid any activities that bring on chest pain.  If heartburn is the cause for your chest pain, raise (elevate) the head of your bed about 6 inches (15 cm) by putting blocks under the legs. Sleeping with more pillows does not effectively relieve heartburn because it only changes the position of your head.  Keep all follow-up visits as told by your health care provider. This is important. This includes any further testing if your chest pain does not go away. Contact a health care provider if:  Your chest pain does not go away.  You have a rash with blisters on your chest.  You have a fever.  You have chills. Get help right away if:  Your chest pain is worse.  You have a cough that gets worse, or you cough up blood.  You have severe pain in your abdomen.  You have severe weakness.  You faint.  You have sudden, unexplained chest discomfort.  You have sudden, unexplained discomfort in your arms, back, neck, or jaw.  You have shortness of breath at any time.  You suddenly start to sweat, or your skin gets clammy.  You feel nauseous or you vomit.  You suddenly feel light-headed or dizzy.  Your heart begins to beat quickly, or it feels like it is skipping beats. These symptoms may represent a serious problem that is an emergency. Do not wait to see if the symptoms will go away. Get medical help right away. Call your local emergency services (911 in the [place] Do not drive yourself to the  hospital. This information is not intended to replace advice given to you by your health care provider. Make sure you discuss any questions you have with your health care provider. Document Released: 01/12/2005 Document Revised: 12/28/2015 Document Reviewed: 12/28/2015 Elsevier Interactive Patient Education  2017 [employer].   Steps to Quit Smoking Smoking tobacco can be bad for your health. It can also affect almost every organ in your body. Smoking puts you and people around you at risk for many serious long-lasting (chronic) diseases. Quitting smoking is hard, but it is one of the best things that you can do for your health. It is never too late to quit. What are the benefits of quitting smoking? When you quit smoking, you lower your risk for getting serious diseases and conditions. They can include:  Lung cancer or lung disease.  Heart disease.  Stroke.  Heart attack.  Not being able to have children (infertility).  Weak bones (osteoporosis) and broken bones (fractures).  If you have coughing, wheezing, and shortness of breath, those symptoms may get better when you quit. You may also get sick less often. If you are pregnant, quitting smoking can help to lower your chances of having a baby of low birth weight. What can I do to help me quit smoking? Talk with your doctor about what can help you quit smoking. Some things you can do (strategies) include:  Quitting smoking totally, instead of slowly cutting back how much you smoke over a period of time.  Going to in-person counseling. You are more likely to quit if you go to many counseling sessions.  Using resources and support systems, such as: ? Database administrator with a Social worker. ?  Phone quitlines. ? Careers information officer. ? Support groups or group counseling. ? Text messaging programs. ? Mobile phone apps or applications.  Taking medicines. Some of these medicines may have nicotine in them. If you are pregnant or  breastfeeding, do not take any medicines to quit smoking unless your doctor says it is okay. Talk with your doctor about counseling or other things that can help you.  Talk with your doctor about using more than one strategy at the same time, such as taking medicines while you are also going to in-person counseling. This can help make quitting easier. What things can I do to make it easier to quit? Quitting smoking might feel very hard at first, but there is a lot that you can do to make it easier. Take these steps:  Talk to your family and friends. Ask them to support and encourage you.  Call phone quitlines, reach out to support groups, or work with a Social worker.  Ask people who smoke to not smoke around you.  Avoid places that make you want (trigger) to smoke, such as: ? Bars. ? Parties. ? Smoke-break areas at work.  Spend time with people who do not smoke.  Lower the stress in your life. Stress can make you want to smoke. Try these things to help your stress: ? Getting regular exercise. ? Deep-breathing exercises. ? Yoga. ? Meditating. ? Doing a body scan. To do this, close your eyes, focus on one area of your body at a time from head to toe, and notice which parts of your body are tense. Try to relax the muscles in those areas.  Download or buy apps on your mobile phone or tablet that can help you stick to your quit plan. There are many free apps, such as QuitGuide from the [employer] Office manager for Disease Control and Prevention). You can find more support from smokefree.gov and other websites.  This information is not intended to replace advice given to you by your health care provider. Make sure you discuss any questions you have with your health care provider. Document Released: 01/29/2009 Document Revised: 12/01/2015 Document Reviewed: 08/19/2014 Elsevier Interactive Patient Education  2018 [employer] you healthy  Get these tests  Blood pressure- Have your blood  pressure checked once a year by your healthcare provider.  Normal blood pressure is 120/80.  Weight- Have your body mass index (BMI) calculated to screen for obesity.  BMI is a measure of body fat based on height and weight. You can also calculate your own BMI at GravelBags.it.  Cholesterol- Have your cholesterol checked regularly starting at age 82, sooner may be necessary if you have diabetes, high blood pressure, if a family member developed heart diseases at an early age or if you smoke.   Chlamydia, HIV, and other sexual transmitted disease- Get screened each year until the age of 17 then within three months of each new sexual partner.  Diabetes- Have your blood sugar checked regularly if you have high blood pressure, high cholesterol, a family history of diabetes or if you are overweight.  Get these vaccines  Flu shot- Every fall.  Tetanus shot- Every 10 years.  Menactra- Single dose; prevents meningitis.  Take these steps  Don't smoke- If you do smoke, ask your healthcare provider about quitting. For tips on how to quit, go to www.smokefree.gov or call 1-800-QUIT-NOW.  Be physically active- Exercise 5 days a week for at least 30 minutes.  If you are not already physically active  start slow and gradually work up to 30 minutes of moderate physical activity.  Examples of moderate activity include walking briskly, mowing the yard, dancing, swimming bicycling, etc.  Eat a healthy diet- Eat a variety of healthy foods such as fruits, vegetables, low fat milk, low fat cheese, yogurt, lean meats, poultry, fish, beans, tofu, etc.  For more information on healthy eating, go to www.thenutritionsource.org  Drink alcohol in moderation- Limit alcohol intake two drinks or less a day.  Never drink and drive.  Dentist- Brush and floss teeth twice daily; visit your dentis twice a year.  Depression-Your emotional health is as important as your physical health.  If you're feeling down,  losing interest in things you normally enjoy please talk with your healthcare provider.  Gun Safety- If you keep a gun in your home, keep it unloaded and with the safety lock on.  Bullets should be stored separately.  Helmet use- Always wear a helmet when riding a motorcycle, bicycle, rollerblading or skateboarding.  Safe sex- If you may be exposed to a sexually transmitted infection, use a condom  Seat belts- Seat bels can save your life; always wear one.  Smoke/Carbon Monoxide detectors- These detectors need to be installed on the appropriate level of your home.  Replace batteries at least once a year.  Skin Cancer- When out in the sun, cover up and use sunscreen SPF 15 or higher.  Violence- If anyone is threatening or hurting you, please tell your healthcare provider.   IF you received an x-ray today, you will receive an invoice from [facility]. Please contact [facility] at [phone] with questions or concerns regarding your invoice.   IF you received labwork today, you will receive an invoice from [facility]. Please contact [facility] at [phone] with questions or concerns regarding your invoice.   Our billing staff will not be able to assist you with questions regarding bills from these companies.  You will be contacted with the lab results as soon as they are available. The fastest way to get your results is to activate your My Chart account. Instructions are located on the last page of this paperwork. If you have not heard from [place] regarding the results in 2 weeks, please contact this office.      I personally performed the services described in this documentation, which was scribed in my presence. The recorded information has been reviewed and considered for accuracy and completeness, addended by me as needed, and agree with information above.  Signed,   Merri Ray, MD [facility] at [place] [facility].  12/05/16 7:06 PM

## 2016-12-06 LAB — COMPREHENSIVE METABOLIC PANEL
ALT: 34 IU/L (ref 0–44)
AST: 27 IU/L (ref 0–40)
Albumin/Globulin Ratio: 1.6 (ref 1.2–2.2)
Albumin: 4.5 g/dL (ref 3.5–5.5)
Alkaline Phosphatase: 66 IU/L (ref 39–117)
BUN/Creatinine Ratio: 11 (ref 9–20)
BUN: 13 mg/dL (ref 6–24)
Bilirubin Total: 0.5 mg/dL (ref 0.0–1.2)
CO2: 26 mmol/L (ref 20–29)
Calcium: 10.2 mg/dL (ref 8.7–10.2)
Chloride: 99 mmol/L (ref 96–106)
Creatinine, Ser: 1.2 mg/dL (ref 0.76–1.27)
GFR calc Af Amer: 83 mL/min/{1.73_m2} (ref >59)
GFR calc non Af Amer: 72 mL/min/{1.73_m2} (ref >59)
Globulin, Total: 2.8 g/dL (ref 1.5–4.5)
Glucose: 96 mg/dL (ref 65–99)
Potassium: 4 mmol/L (ref 3.5–5.2)
Sodium: 140 mmol/L (ref 134–144)
Total Protein: 7.3 g/dL (ref 6.0–8.5)

## 2016-12-06 LAB — TSH: TSH: 1.48 u[IU]/mL (ref 0.450–4.500)

## 2016-12-06 LAB — LIPID PANEL
Chol/HDL Ratio: 5 ratio (ref 0.0–5.0)
Cholesterol, Total: 145 mg/dL (ref 100–199)
HDL: 29 mg/dL — ABNORMAL LOW (ref >39)
LDL Calculated: 75 mg/dL (ref 0–99)
Triglycerides: 206 mg/dL — ABNORMAL HIGH (ref 0–149)
VLDL Cholesterol Cal: 41 mg/dL — ABNORMAL HIGH (ref 5–40)

## 2017-01-13 ENCOUNTER — Encounter: Payer: Self-pay | Admitting: Cardiology

## 2017-01-24 NOTE — Progress Notes (Deleted)
Cardiology Office Note   Date:  01/24/2017   ID:  Micheal Roberts, DOB Jan 20, 1971, MRN [id number]  PCP:  Wendie Agreste, MD  Cardiologist:   Minus Breeding, MD  Referring:  ***  No chief complaint on file.     History of Present Illness: Micheal Roberts is a 46 y.o. male who is referred by *** for evaluation of ***       Past Medical History:  Diagnosis Date  . Anxiety   . IBS (irritable bowel syndrome)     Past Surgical History:  Procedure Laterality Date  . left ACL arthroscopy       Current Outpatient Prescriptions  Medication Sig Dispense Refill  . clonazePAM (KLONOPIN) 0.5 MG tablet Take 1 tablet (0.5 mg total) by mouth 2 (two) times daily as needed. for anxiety 30 tablet 0  . FLUoxetine (PROZAC) 20 MG tablet Take 1 tablet (20 mg total) by mouth daily. 30 tablet 1  . Multiple Vitamin (MULTIVITAMIN) tablet Take 1 tablet by mouth daily. Reported on 05/25/2015     No current facility-administered medications for this visit.     Allergies:   Patient has no known allergies.    Social History:  The patient  reports that he has been smoking Cigarettes.  He has a 5.00 pack-year smoking history. He has never used smokeless tobacco. He reports that he drinks alcohol. He reports that he does not use drugs.   Family History:  The patient's ***family history includes Anemia in his mother; Hypertension in his father and mother; Thyroid disease in his father.    ROS:  Please see the history of present illness.   Otherwise, review of systems are positive for {NONE DEFAULTED:18576::"none"}.   All other systems are reviewed and negative.    PHYSICAL EXAM: VS:  There were no vitals taken for this visit. , BMI There is no height or weight on file to calculate BMI. GENERAL:  Well appearing HEENT:  Pupils equal round and reactive, fundi not visualized, oral mucosa unremarkable NECK:  No jugular venous distention, waveform within normal limits, carotid upstroke brisk and  symmetric, no bruits, no thyromegaly LYMPHATICS:  No cervical, inguinal adenopathy LUNGS:  Clear to auscultation bilaterally BACK:  No CVA tenderness CHEST:  Unremarkable HEART:  PMI not displaced or sustained,S1 and S2 within normal limits, no S3, no S4, no clicks, no rubs, *** murmurs ABD:  Flat, positive bowel sounds normal in frequency in pitch, no bruits, no rebound, no guarding, no midline pulsatile mass, no hepatomegaly, no splenomegaly EXT:  2 plus pulses throughout, no edema, no cyanosis no clubbing SKIN:  No rashes no nodules NEURO:  Cranial nerves II through XII grossly intact, motor grossly intact throughout PSYCH:  Cognitively intact, oriented to person place and time    EKG:  EKG {ACTION; IS/IS IRW:[id number]} ordered today. The ekg ordered today demonstrates ***   Recent Labs: 12/05/2016: ALT 34; BUN 13; Creatinine, Ser 1.20; Potassium 4.0; Sodium 140; TSH 1.480    Lipid Panel    Component Value Date/Time   CHOL 145 12/05/2016 1034   TRIG 206 (H) 12/05/2016 1034   HDL 29 (L) 12/05/2016 1034   CHOLHDL 5.0 12/05/2016 1034   CHOLHDL 5.7 07/25/2013 1138   VLDL 31 07/25/2013 1138   LDLCALC 75 12/05/2016 1034      Wt Readings from Last 3 Encounters:  12/05/16 216 lb 12.8 oz (98.3 kg)  06/21/16 216 lb (98 kg)  12/01/15 229 lb 9.6 oz (104.1 kg)  Other studies Reviewed: Additional studies/ records that were reviewed today include: ***. Review of the above records demonstrates:  Please see elsewhere in the note.  ***   ASSESSMENT AND PLAN:  ***   Current medicines are reviewed at length with the patient today.  The patient {ACTIONS; HAS/DOES NOT HAVE:19233} concerns regarding medicines.  The following changes have been made:  {PLAN; NO CHANGE:13088:s}  Labs/ tests ordered today include: *** No orders of the defined types were placed in this encounter.    Disposition:   FU with ***    Signed, Minus Breeding, MD  01/24/2017 8:43 PM    [facility]

## 2017-01-25 ENCOUNTER — Ambulatory Visit: Payer: BC Managed Care – PPO | Admitting: Cardiology

## 2017-03-13 NOTE — Progress Notes (Deleted)
Cardiology Office Note   Date:  03/13/2017   ID:  Kaulana Brindle, DOB [birth year], MRN [id number]  PCP:  Wendie Agreste, MD  Cardiologist:   Minus Breeding, MD  Referring:  ***  No chief complaint on file.     History of Present Illness: Florian Chauca is a 46 y.o. male who is referred by *** for evaluation of ***   Past Medical History:  Diagnosis Date  . Anxiety   . IBS (irritable bowel syndrome)     Past Surgical History:  Procedure Laterality Date  . left ACL arthroscopy       Current Outpatient Medications  Medication Sig Dispense Refill  . clonazePAM (KLONOPIN) 0.5 MG tablet Take 1 tablet (0.5 mg total) by mouth 2 (two) times daily as needed. for anxiety 30 tablet 0  . FLUoxetine (PROZAC) 20 MG tablet Take 1 tablet (20 mg total) by mouth daily. 30 tablet 1  . Multiple Vitamin (MULTIVITAMIN) tablet Take 1 tablet by mouth daily. Reported on 05/25/2015     No current facility-administered medications for this visit.     Allergies:   Patient has no known allergies.    Social History:  The patient  reports that he has been smoking cigarettes.  He has a 5.00 pack-year smoking history. he has never used smokeless tobacco. He reports that he drinks alcohol. He reports that he does not use drugs.   Family History:  The patient's ***family history includes Anemia in his mother; Hypertension in his father and mother; Thyroid disease in his father.    ROS:  Please see the history of present illness.   Otherwise, review of systems are positive for {NONE DEFAULTED:18576::"none"}.   All other systems are reviewed and negative.    PHYSICAL EXAM: VS:  There were no vitals taken for this visit. , BMI There is no height or weight on file to calculate BMI. GENERAL:  Well appearing HEENT:  Pupils equal round and reactive, fundi not visualized, oral mucosa unremarkable NECK:  No jugular venous distention, waveform within normal limits, carotid upstroke brisk and  symmetric, no bruits, no thyromegaly LYMPHATICS:  No cervical, inguinal adenopathy LUNGS:  Clear to auscultation bilaterally BACK:  No CVA tenderness CHEST:  Unremarkable HEART:  PMI not displaced or sustained,S1 and S2 within normal limits, no S3, no S4, no clicks, no rubs, *** murmurs ABD:  Flat, positive bowel sounds normal in frequency in pitch, no bruits, no rebound, no guarding, no midline pulsatile mass, no hepatomegaly, no splenomegaly EXT:  2 plus pulses throughout, no edema, no cyanosis no clubbing SKIN:  No rashes no nodules NEURO:  Cranial nerves II through XII grossly intact, motor grossly intact throughout PSYCH:  Cognitively intact, oriented to person place and time    EKG:  EKG {ACTION; IS/IS WUJ:[id number]} ordered today. The ekg ordered today demonstrates ***   Recent Labs: 12/05/2016: ALT 34; BUN 13; Creatinine, Ser 1.20; Potassium 4.0; Sodium 140; TSH 1.480    Lipid Panel    Component Value Date/Time   CHOL 145 12/05/2016 1034   TRIG 206 (H) 12/05/2016 1034   HDL 29 (L) 12/05/2016 1034   CHOLHDL 5.0 12/05/2016 1034   CHOLHDL 5.7 07/25/2013 1138   VLDL 31 07/25/2013 1138   LDLCALC 75 12/05/2016 1034      Wt Readings from Last 3 Encounters:  12/05/16 216 lb 12.8 oz (98.3 kg)  06/21/16 216 lb (98 kg)  12/01/15 229 lb 9.6 oz (104.1 kg)  Other studies Reviewed: Additional studies/ records that were reviewed today include: ***. Review of the above records demonstrates:  Please see elsewhere in the note.  ***   ASSESSMENT AND PLAN:  ***   Current medicines are reviewed at length with the patient today.  The patient {ACTIONS; HAS/DOES NOT HAVE:19233} concerns regarding medicines.  The following changes have been made:  {PLAN; NO CHANGE:13088:s}  Labs/ tests ordered today include: *** No orders of the defined types were placed in this encounter.    Disposition:   FU with ***    Signed, Minus Breeding, MD  03/13/2017 1:58 PM    [facility]

## 2017-03-15 ENCOUNTER — Ambulatory Visit: Payer: BC Managed Care – PPO | Admitting: Cardiology

## 2018-01-31 ENCOUNTER — Encounter (HOSPITAL_COMMUNITY): Payer: Self-pay | Admitting: Emergency Medicine

## 2018-01-31 ENCOUNTER — Ambulatory Visit (HOSPITAL_COMMUNITY)
Admission: EM | Admit: 2018-01-31 | Discharge: 2018-01-31 | Disposition: A | Discharge status: Home / Self Care | Payer: BC Managed Care – PPO | Attending: Family Medicine | Admitting: Family Medicine

## 2018-01-31 DIAGNOSIS — R05 Cough: Secondary | ICD-10-CM

## 2018-01-31 DIAGNOSIS — R059 Cough, unspecified: Secondary | ICD-10-CM

## 2018-01-31 MED ORDER — BENZONATATE 100 MG PO CAPS: 100.0000 mg | ORAL_CAPSULE | Freq: Three times a day (TID) | ORAL | 0 refills | Status: DC

## 2018-01-31 NOTE — ED Provider Notes (Signed)
[place]   [id number] 01/31/18 Arrival Time: [place] PLAN:  1. Cough    Likely related to viral URI that is improving.  Meds ordered this encounter  Medications  . benzonatate (TESSALON) 100 MG capsule    Sig: Take 1 capsule (100 mg total) by mouth every 8 (eight) hours.    Dispense:  21 capsule    Refill:  0   Discussed typical duration of symptoms. OTC symptom care as needed. Ensure adequate fluid intake and rest. May f/u with PCP or here as needed.  Reviewed expectations re: course of current medical issues. Questions answered. Outlined signs and symptoms indicating need for more acute intervention. Patient verbalized understanding. After Visit Summary given.   SUBJECTIVE: History from: patient.  Dexton Zwilling is a 47 y.o. male who presents with complaint of a persistent dry cough. Onset abrupt, 3 days ago. Overall without fatigue and without body aches. SOB: none. Wheezing: none. Fever: no. Overall normal PO intake without n/v. Sick contacts: no. No specific or significant aggravating or alleviating factors reported. OTC treatment: cough medication without much help.  Received flu shot this year: no.  Social History   Tobacco Use  Smoking Status Current Every Day Smoker  . Packs/day: 0.50  . Years: 10.00  . Pack years: 5.00  . Types: Cigarettes  Smokeless Tobacco Never Used    ROS: As per HPI.   OBJECTIVE:  Vitals:   01/31/18 1052 01/31/18 1053  BP:  (!) 155/100  Pulse: 84   Resp: 16   Temp: 98.5 F (36.9 C)   SpO2: 99%     General appearance: alert; appears fatigued HEENT: nasal congestion; clear runny nose; throat irritation secondary to post-nasal drainage Neck: supple without LAD Lungs: unlabored respirations, symmetrical air entry; cough: mild; no respiratory distress Skin: warm and dry Psychological: alert and cooperative; normal mood and affect   No Known Allergies  Past Medical History:  Diagnosis Date    . Anxiety   . IBS (irritable bowel syndrome)    Family History  Problem Relation Age of Onset  . Hypertension Mother   . Anemia Mother   . Thyroid disease Father   . Hypertension Father    Social History   Socioeconomic History  . Marital status: Married    Spouse name: Not on file  . Number of children: Not on file  . Years of education: Not on file  . Highest education level: Not on file  Occupational History  . Not on file  Social Needs  . Financial resource strain: Not on file  . Food insecurity:    Worry: Not on file    Inability: Not on file  . Transportation needs:    Medical: Not on file    Non-medical: Not on file  Tobacco Use  . Smoking status: Current Every Day Smoker    Packs/day: 0.50    Years: 10.00    Pack years: 5.00    Types: Cigarettes  . Smokeless tobacco: Never Used  Substance and Sexual Activity  . Alcohol use: Yes    Alcohol/week: 0.0 standard drinks    Comment: beer - only on ocassions  . Drug use: No  . Sexual activity: Yes  Lifestyle  . Physical activity:    Days per week: Not on file    Minutes per session: Not on file  . Stress: Not on file  Relationships  . Social connections:    Talks on phone: Not on file  Gets together: Not on file    Attends religious service: Not on file    Active member of club or organization: Not on file    Attends meetings of clubs or organizations: Not on file    Relationship status: Not on file  . Intimate partner violence:    Fear of current or ex partner: Not on file    Emotionally abused: Not on file    Physically abused: Not on file    Forced sexual activity: Not on file  Other Topics Concern  . Not on file  Social History Narrative  . Not on file           Vanessa Kick, MD 01/31/18 1141

## 2018-01-31 NOTE — ED Triage Notes (Signed)
Pt c/o cough since the weekend, states he feels like hes trying to cough something up but nothing is coming up. Denies any other symptoms.

## 2018-02-07 ENCOUNTER — Other Ambulatory Visit: Payer: Self-pay

## 2018-02-07 ENCOUNTER — Encounter (HOSPITAL_COMMUNITY): Payer: Self-pay | Admitting: *Deleted

## 2018-02-07 ENCOUNTER — Emergency Department (HOSPITAL_COMMUNITY)
Admission: EM | Admit: 2018-02-07 | Discharge: 2018-02-07 | Disposition: A | Discharge status: Home / Self Care | Payer: BC Managed Care – PPO | Attending: Emergency Medicine | Admitting: Emergency Medicine

## 2018-02-07 DIAGNOSIS — F17210 Nicotine dependence, cigarettes, uncomplicated: Secondary | ICD-10-CM | POA: Insufficient documentation

## 2018-02-07 DIAGNOSIS — R21 Rash and other nonspecific skin eruption: Secondary | ICD-10-CM | POA: Insufficient documentation

## 2018-02-07 DIAGNOSIS — Z79899 Other long term (current) drug therapy: Secondary | ICD-10-CM | POA: Insufficient documentation

## 2018-02-07 DIAGNOSIS — J069 Acute upper respiratory infection, unspecified: Secondary | ICD-10-CM | POA: Diagnosis not present

## 2018-02-07 DIAGNOSIS — L03114 Cellulitis of left upper limb: Secondary | ICD-10-CM | POA: Diagnosis not present

## 2018-02-07 DIAGNOSIS — R2232 Localized swelling, mass and lump, left upper limb: Secondary | ICD-10-CM | POA: Diagnosis present

## 2018-02-07 MED ORDER — NYSTATIN-TRIAMCINOLONE 100000-0.1 UNIT/GM-% EX CREA: TOPICAL_CREAM | CUTANEOUS | 0 refills | Status: DC

## 2018-02-07 MED ORDER — DOXYCYCLINE HYCLATE 100 MG PO TABS
100.0000 mg | ORAL_TABLET | Freq: Once | ORAL | Status: AC
  Administered 2018-02-07: 100 mg via ORAL
  Filled 2018-02-07: qty 1

## 2018-02-07 MED ORDER — DOXYCYCLINE HYCLATE 100 MG PO CAPS: 100.0000 mg | ORAL_CAPSULE | Freq: Two times a day (BID) | ORAL | 0 refills | Status: DC

## 2018-02-07 MED ORDER — ONDANSETRON 4 MG PO TBDP
4.0000 mg | ORAL_TABLET | Freq: Once | ORAL | Status: AC
Start: 2018-02-07 — End: 2018-02-07
  Administered 2018-02-07: 4 mg via ORAL
  Filled 2018-02-07: qty 1

## 2018-02-07 MED ORDER — ONDANSETRON 4 MG PO TBDP: 4.0000 mg | ORAL_TABLET | Freq: Four times a day (QID) | ORAL | 0 refills | Status: DC | PRN

## 2018-02-07 NOTE — ED Triage Notes (Signed)
Pt noticed rash to his abd and swelling to L hand onset tonight. Unsure of any new laundry detergents. Denies new soaps. Has had ongoing cough, denies any resp complaints at present

## 2018-02-07 NOTE — Discharge Instructions (Signed)
You were seen in the emergency department for a rash to your lower abdomen.  It looks like there may be a component of an allergic reaction versus possible yeast infection.  We are putting you on a cream that would treat you for both.  You are also being treated for possible early cellulitis to the left hand with doxycycline.  This would also cover any potential bacterial causes of pneumonia.  You may alternate Tylenol 1000 mg every 6 hours as needed for pain and Ibuprofen 800 mg every 8 hours as needed for pain.  Please take Ibuprofen with food.

## 2018-02-07 NOTE — ED Provider Notes (Signed)
TIME SEEN: 2:54 AM  CHIEF COMPLAINT: Multiple complaints  HPI: Patient is a 47 year old male with history of IBS, anxiety who presents to the emergency department with multiple complaints.  Patient complains of several days of a dry cough.  No fever.  No chest pain or shortness of breath.  Was seen at urgent care and thought that this was a viral illness.  States symptoms have been ongoing for 10 days.  Patient also reports swelling to the left hand for the past day.  He has redness to the dorsal aspect.  No pain or swelling more proximal in the arm.  No history of DVT.  No injury to the hand.  He is right-hand dominant.  He has some discomfort with flexing and extending the fingers but has full range of motion.   Patient also complains of a rash to his lower abdomen that is pruritic.  No other rash noted.  No new soaps, lotions, detergents, medications, food exposures.   She also found to be hypertensive.  States his blood pressure was elevated at urgent care.  He has no history of the same.  He does have a PCP.  Not on blood pressure medication.  He is currently asymptomatic.  ROS: See HPI Constitutional: no fever  Eyes: no drainage  ENT: no runny nose   Cardiovascular:  no chest pain  Resp: no SOB  GI: no vomiting GU: no dysuria Integumentary: no rash  Allergy: no hives  Musculoskeletal: no leg swelling  Neurological: no slurred speech ROS otherwise negative  PAST MEDICAL HISTORY/PAST SURGICAL HISTORY:  Past Medical History:  Diagnosis Date  . Anxiety   . IBS (irritable bowel syndrome)     MEDICATIONS:  Prior to Admission medications   Medication Sig Start Date End Date Taking? Authorizing Provider  benzonatate (TESSALON) 100 MG capsule Take 1 capsule (100 mg total) by mouth every 8 (eight) hours. 01/31/18   Vanessa Kick, MD  clonazePAM (KLONOPIN) 0.5 MG tablet Take 1 tablet (0.5 mg total) by mouth 2 (two) times daily as needed. for anxiety Patient not taking: Reported on  01/31/2018 12/05/16   Wendie Agreste, MD  FLUoxetine (PROZAC) 20 MG tablet Take 1 tablet (20 mg total) by mouth daily. Patient not taking: Reported on 01/31/2018 12/05/16   Wendie Agreste, MD  Multiple Vitamin (MULTIVITAMIN) tablet Take 1 tablet by mouth daily. Reported on 05/25/2015    [provider]    ALLERGIES:  No Known Allergies  SOCIAL HISTORY:  Social History   Tobacco Use  . Smoking status: Current Every Day Smoker    Packs/day: 0.50    Years: 10.00    Pack years: 5.00    Types: Cigarettes  . Smokeless tobacco: Never Used  Substance Use Topics  . Alcohol use: Yes    Alcohol/week: 0.0 standard drinks    Comment: beer - only on ocassions    FAMILY HISTORY: Family History  Problem Relation Age of Onset  . Hypertension Mother   . Anemia Mother   . Thyroid disease Father   . Hypertension Father     EXAM: BP (!) 157/111   Pulse 91   Temp 98.5 F (36.9 C)   Resp 18   SpO2 99%  CONSTITUTIONAL: Alert and oriented and responds appropriately to questions. Well-appearing; well-nourished, afebrile, nontoxic HEAD: Normocephalic EYES: Conjunctivae clear, pupils appear equal ENT: normal nose; moist mucous membranes NECK: Supple CARD: RRR; S1 and S2 appreciated; no murmurs, no clicks, no rubs, no gallops RESP: Normal  chest excursion without splinting or tachypnea; breath sounds clear and equal bilaterally; no wheezes, no rhonchi, no rales, no hypoxia or respiratory distress, speaking full sentences ABD/GI: Nondistended BACK:  The back appears normal  EXT: Patient does have mild soft tissue swelling noted to the dorsal left hand with erythema and warmth.  There is no fluctuance or induration noted.  He is a 2+ radial pulse.  Normal range of motion in the fingers and wrist of the left side.  No tenderness throughout the forearm or upper left arm.  Compartments are soft.   SKIN: Normal color for age and race; warm; patient does have urticaria with surrounding  erythema noted under his pannus and in the inguinal folds without satellite lesions, drainage.  No petechiae or purpura.  No blisters or desquamation.  No rash on the palms or his membranes. NEURO: Moves all extremities equally PSYCH: The patient's mood and manner are appropriate. Grooming and personal hygiene are appropriate.  MEDICAL DECISION MAKING: Patient here with multiple complaints.  As for his upper respiratory infection I think this is likely viral in nature.  His lungs are clear and he is afebrile.  I do not feel he needs a chest x-ray at this time.  He is hypertensive here but asymptomatic.  I have recommended close follow-up with his PCP for this.  He verbalized understanding.   He appears to have early left hand cellulitis without abscess.  No signs of felon, flexor tenosynovitis, sepsis, herpetic whitlow.  We will start him on doxycycline.  No injury to the hand to suggest fracture.  I do not feel x-rays would be helpful at this time.  Neurovascularly intact distally.  Given outpatient hand follow-up if symptoms not improving.  Discussed with him that doxycycline would also potentially cover any bacterial causes of his cough.   He also has a rash to the lower abdomen.  This looks like an allergic reaction but given it is only in the inguinal fold and underneath his pannus this could be related to yeast.  We will start him on nystatin triamcinolone.  I do not feel he needs systemic steroids.  He is otherwise well-appearing without signs of life-threatening rash.    At this time, I do not feel there is any life-threatening condition present. I have reviewed and discussed all results (EKG, imaging, lab, urine as appropriate) and exam findings with patient/family. I have reviewed nursing notes and appropriate previous records.  I feel the patient is safe to be discharged home without further emergent workup and can continue workup as an outpatient as needed. Discussed usual and customary  return precautions. Patient/family verbalize understanding and are comfortable with this plan.  Outpatient follow-up has been provided if needed. All questions have been answered.      Ward, Delice Bison, DO 02/07/18 [phone]

## 2018-02-08 ENCOUNTER — Ambulatory Visit: Payer: BC Managed Care – PPO | Admitting: Physician Assistant

## 2018-02-08 ENCOUNTER — Other Ambulatory Visit: Payer: Self-pay

## 2018-02-08 ENCOUNTER — Encounter: Payer: Self-pay | Admitting: Physician Assistant

## 2018-02-08 VITALS — BP 120/76 | HR 95 | Temp 99.5°F | Resp 16 | Ht 73.25 in | Wt 226.4 lb

## 2018-02-08 DIAGNOSIS — L509 Urticaria, unspecified: Secondary | ICD-10-CM

## 2018-02-08 DIAGNOSIS — R21 Rash and other nonspecific skin eruption: Secondary | ICD-10-CM | POA: Diagnosis not present

## 2018-02-08 DIAGNOSIS — L299 Pruritus, unspecified: Secondary | ICD-10-CM

## 2018-02-08 LAB — POCT SKIN KOH: Skin KOH, POC: NEGATIVE

## 2018-02-08 MED ORDER — HYDROXYZINE HCL 25 MG PO TABS: 25.0000 mg | ORAL_TABLET | Freq: Four times a day (QID) | ORAL | 1 refills | Status: DC | PRN

## 2018-02-08 MED ORDER — PREDNISONE 20 MG PO TABS: ORAL_TABLET | ORAL | 0 refills | Status: DC

## 2018-02-08 MED ORDER — METHYLPREDNISOLONE ACETATE 80 MG/ML IJ SUSP
80.0000 mg | Freq: Once | INTRAMUSCULAR | Status: AC
Start: 2018-02-08 — End: 2018-02-08
  Administered 2018-02-08: 80 mg via INTRAMUSCULAR

## 2018-02-08 NOTE — Patient Instructions (Addendum)
You received an injection of prednisone today.  Start taking benadryl '50mg'$  every 4-6 hrs x 2 days. Then take Zantac AND zyrtec as prescribed x 5 days.  Come back for a recheck of your symptoms in 3-5 days. Go to the emergency department if you develop shortness of breath, wheezing, abdominal pain, nausea, vomiting.     Hives Hives (urticaria) are itchy, red, swollen areas on your skin. Hives can appear on any part of your body and can vary in size. They can be as small as the tip of a pen or much larger. Hives often fade within 24 hours (acute hives). In other cases, new hives appear after old ones fade. This cycle can continue for several days or weeks (chronic hives). Hives result from your body's reaction to an irritant or to something that you are allergic to (trigger). When you are exposed to a trigger, your body releases a chemical (histamine) that causes redness, itching, and swelling. You can get hives immediately after being exposed to a trigger or hours later. Hives do not spread from person to person (are not contagious). Your hives may get worse with scratching, exercise, and emotional stress. What are the causes? Causes of this condition include:  Allergies to certain foods or ingredients.  Insect bites or stings.  Exposure to pollen or pet dander.  Contact with latex or chemicals.  Spending time in sunlight, heat, or cold (exposure).  Exercise.  Stress.  You can also get hives from some medical conditions and treatments. These include:  Viruses, including the common cold.  Bacterial infections, such as urinary tract infections and strep throat.  Disorders such as vasculitis, lupus, or thyroid disease.  Certain medications.  Allergy shots.  Blood transfusions.  Sometimes, the cause of hives is not known (idiopathic hives). What increases the risk? This condition is more likely to develop in:  Women.  People who have food allergies, especially to citrus  fruits, milk, eggs, peanuts, tree nuts, or shellfish.  People who are allergic to: ? Medicines. ? Latex. ? Insects. ? Animals. ? Pollen.  People who have certain medical conditions, includinglupus or thyroid disease.  What are the signs or symptoms? The main symptom of this condition is raised, itchyred or white bumps or patches on your skin. These areas may:  Become large and swollen (welts).  Change in shape and location, quickly and repeatedly.  Be separate hives or connect over a large area of skin.  Sting or become painful.  Turn white when pressed in the center (blanch).  In severe cases, yourhands, feet, and face may also become swollen. This may occur if hives develop deeper in your skin. How is this diagnosed? This condition is diagnosed based on your symptoms, medical history, and physical exam. Your skin, urine, or blood may be tested to find out what is causing your hives and to rule out other health issues. Your health care provider may also remove a small sample of skin from the affected area and examine it under a microscope (biopsy). How is this treated? Treatment depends on the severity of your condition. Your health care provider may recommend using cool, wet cloths (cool compresses) or taking cool showers to relieve itching. Hives are sometimes treated with medicines, including:  Antihistamines.  Corticosteroids.  Antibiotics.  An injectable medicine (omalizumab). Your health care provider may prescribe this if you have chronic idiopathic hives and you continue to have symptoms even after treatment with antihistamines.  Severe cases may require an emergency  injection of adrenaline (epinephrine) to prevent a life-threatening allergic reaction (anaphylaxis). Follow these instructions at home: Medicines  Take or apply over-the-counter and prescription medicines only as told by your health care provider.  If you were prescribed an antibiotic medicine, use  it as told by your health care provider. Do not stop taking the antibiotic even if you start to feel better. Skin Care  Apply cool compresses to the affected areas.  Do not scratch or rub your skin. General instructions  Do not take hot showers or baths. This can make itching worse.  Do not wear tight-fitting clothing.  Use sunscreen and wear protective clothing when you are outside.  Avoid any substances that cause your hives. Keep a journal to help you track what causes your hives. Write down: ? What medicines you take. ? What you eat and drink. ? What products you use on your skin.  Keep all follow-up visits as told by your health care provider. This is important. Contact a health care provider if:  Your symptoms are not controlled with medicine.  Your joints are painful or swollen. Get help right away if:  You have a fever.  You have pain in your abdomen.  Your tongue or lips are swollen.  Your eyelids are swollen.  Your chest or throat feels tight.  You have trouble breathing or swallowing. These symptoms may represent a serious problem that is an emergency. Do not wait to see if the symptoms will go away. Get medical help right away. Call your local emergency services (911 in the [place] Do not drive yourself to the hospital. This information is not intended to replace advice given to you by your health care provider. Make sure you discuss any questions you have with your health care provider. Document Released: 04/04/2005 Document Revised: 09/02/2015 Document Reviewed: 01/21/2015 Elsevier Interactive Patient Education  [employer].  Thank you for coming in today. I hope you feel we met your needs.  Feel free to call PCP if you have any questions or further requests.  Please consider signing up for MyChart if you do not already have it, as this is a great way to communicate with me.  Best,  Whitney McVey, PA-C   IF you received an x-ray today, you will  receive an invoice from [facility]. Please contact [facility] at [phone] with questions or concerns regarding your invoice.   IF you received labwork today, you will receive an invoice from [place]. Please contact [facility] at [phone] with questions or concerns regarding your invoice.   Our billing staff will not be able to assist you with questions regarding bills from these companies.  You will be contacted with the lab results as soon as they are available. The fastest way to get your results is to activate your My Chart account. Instructions are located on the last page of this paperwork. If you have not heard from [place] regarding the results in 2 weeks, please contact this office.

## 2018-02-08 NOTE — Progress Notes (Signed)
Micheal Roberts  MRN: [id number] DOB: 07-16-1970  PCP: Wendie Agreste, MD  Subjective:  Pt is a 47 year old male who presents to clinic for f/u rash and hand swelling.   Rash started 2 days ago. He was treated with Nystatin for yeast infection of his belly. Left hand started swelling up yesterday and he was treated for cellulitis with Doxycycline.  Rash has been progressively worsening. Now involves back, buttocks, b/l arms, chest and abdomen.  Benadryl is not working.  Denies shob, wheezing, swelling of oropharynx, nausea, vomiting, abdominal pain.   Review of Systems  Constitutional: Negative for chills and fever.  Musculoskeletal: Negative for arthralgias and joint swelling.  Skin: Positive for rash.    There are no active problems to display for this patient.   Current Outpatient Medications on File Prior to Visit  Medication Sig Dispense Refill  . doxycycline (VIBRAMYCIN) 100 MG capsule Take 1 capsule (100 mg total) by mouth 2 (two) times daily. 20 capsule 0  . Multiple Vitamin (MULTIVITAMIN) tablet Take 1 tablet by mouth daily. Reported on 05/25/2015    . nystatin-triamcinolone (MYCOLOG II) cream Apply to affected area twice daily 30 g 0  . ondansetron (ZOFRAN ODT) 4 MG disintegrating tablet Take 1 tablet (4 mg total) by mouth every 6 (six) hours as needed. 20 tablet 0  . clonazePAM (KLONOPIN) 0.5 MG tablet Take 1 tablet (0.5 mg total) by mouth 2 (two) times daily as needed. for anxiety (Patient not taking: Reported on 01/31/2018) 30 tablet 0  . FLUoxetine (PROZAC) 20 MG tablet Take 1 tablet (20 mg total) by mouth daily. (Patient not taking: Reported on 01/31/2018) 30 tablet 1   No current facility-administered medications on file prior to visit.     No Known Allergies   Objective:  BP 120/76 (BP Location: Right Arm, Patient Position: Sitting, Cuff Size: Large)   Pulse 95   Temp 99.5 F (37.5 C) (Oral)   Resp 16   Ht 6' 1.25" (1.861 m)   Wt 226 lb 6.4 oz (102.7  kg)   SpO2 100%   BMI 29.67 kg/m   Physical Exam  Constitutional: He appears well-developed and well-nourished. No distress.  Eyes:  Mild edema b/l eye lids.   Pulmonary/Chest: Effort normal. No respiratory distress. He has no wheezes.  Skin: Rash noted. Rash is urticarial. He is not diaphoretic.     Circumscribed, raised, edematous, red plaques  Vitals reviewed.   Results for orders placed or performed in visit on 02/08/18  POCT Skin KOH  Result Value Ref Range   Skin KOH, POC Negative Negative    Assessment and Plan :  1. Urticaria - pt presents with worsening rash x 3 days. KOH negative. He is currently on Doxycycline, but started this after rash presented. Low suspicion for medication reaction. NAD. HPI not suggestive of anaphylaxis. Depomedrol IM administered by [facility]. Rash appears to be urticarial in nature. Benadryl is not working. Will try prednisone course as well as Zantac and Zyrtec. RTC in 3 days to recheck symptoms.   2. Rash and nonspecific skin eruption - POCT Skin KOH - methylPREDNISolone acetate (DEPO-MEDROL) injection 80 mg - predniSONE (DELTASONE) 20 MG tablet; Take 3 PO QAM x3days, 2 PO QAM x3days, 1 PO QAM x3days  Dispense: 18 tablet; Refill: 0  3. Itching - hydrOXYzine (ATARAX/VISTARIL) 25 MG tablet; Take 1 tablet (25 mg total) by mouth every 6 (six) hours as needed for itching.  Dispense: 30 tablet; Refill: 1  Mercer Pod, PA-C  [facility] [place] 02/08/2018 10:58 AM  Please note: Portions of this report may have been transcribed using dragon voice recognition software. Every effort was made to ensure accuracy; however, inadvertent computerized transcription errors may be present.

## 2018-02-12 ENCOUNTER — Other Ambulatory Visit: Payer: Self-pay

## 2018-02-12 ENCOUNTER — Ambulatory Visit: Payer: BC Managed Care – PPO | Admitting: Family Medicine

## 2018-02-12 ENCOUNTER — Telehealth: Payer: Self-pay

## 2018-02-12 ENCOUNTER — Encounter: Payer: Self-pay | Admitting: Family Medicine

## 2018-02-12 VITALS — BP 165/59 | HR 101 | Temp 98.2°F | Resp 16 | Ht 73.0 in | Wt 232.0 lb

## 2018-02-12 DIAGNOSIS — T783XXA Angioneurotic edema, initial encounter: Secondary | ICD-10-CM

## 2018-02-12 DIAGNOSIS — R030 Elevated blood-pressure reading, without diagnosis of hypertension: Secondary | ICD-10-CM

## 2018-02-12 MED ORDER — PREDNISONE 20 MG PO TABS: ORAL_TABLET | ORAL | 0 refills | Status: DC

## 2018-02-12 MED ORDER — FAMOTIDINE 20 MG PO TABS: 20.0000 mg | ORAL_TABLET | Freq: Two times a day (BID) | ORAL | 0 refills | Status: DC

## 2018-02-12 MED ORDER — METHYLPREDNISOLONE ACETATE 80 MG/ML IJ SUSP
80.0000 mg | Freq: Once | INTRAMUSCULAR | Status: AC
  Administered 2018-02-12: 80 mg via INTRAMUSCULAR

## 2018-02-12 MED ORDER — HYDROXYZINE HCL 25 MG PO TABS: 25.0000 mg | ORAL_TABLET | Freq: Four times a day (QID) | ORAL | 1 refills | Status: DC | PRN

## 2018-02-12 NOTE — Patient Instructions (Addendum)
     If you have lab work done today you will be contacted with your lab results within the next 2 weeks.  If you have not heard from [place] then please contact us. The fastest way to get your results is to register for My Chart.   IF you received an x-ray today, you will receive an invoice from [facility]. Please contact [facility] at [phone] with questions or concerns regarding your invoice.   IF you received labwork today, you will receive an invoice from [place]. Please contact [facility] at [phone] with questions or concerns regarding your invoice.   Our billing staff will not be able to assist you with questions regarding bills from these companies.  You will be contacted with the lab results as soon as they are available. The fastest way to get your results is to activate your My Chart account. Instructions are located on the last page of this paperwork. If you have not heard from [place] regarding the results in 2 weeks, please contact this office.      Angioedema Angioedema is sudden swelling in the body. The swelling can happen in any part of the body. It often happens on the skin and causes itchy, bumpy patches (hives) to form. This condition may:  Happen only one time.  Happen more than one time. It may come back at random times.  Keep coming back for a number of years. Someday it may stop coming back.  Follow these instructions at home:  Take over-the-counter and prescription medicines only as told by your doctor.  If you were given medicines for emergency allergy treatment, always carry them with you.  Wear a medical bracelet as told by your doctor.  Avoid the things that cause your attacks (triggers).  If this condition was passed to you from your parents and you want to have kids, talk to your doctor. Your kids may also have this condition. Contact a doctor if:  You have another attack.  Your attacks happen more often, even after you take  steps to prevent them.  This condition was passed to you by your parents and you want to have kids. Get help right away if:  Your mouth, tongue, or lips get very swollen.  You have trouble breathing.  You have trouble swallowing.  You pass out (faint). This information is not intended to replace advice given to you by your health care provider. Make sure you discuss any questions you have with your health care provider. Document Released: 03/23/2009 Document Revised: 11/04/2015 Document Reviewed: 10/13/2015 Elsevier Interactive Patient Education  [employer].

## 2018-02-12 NOTE — Telephone Encounter (Signed)
Pt left before receiving injection. He will be bk before close today.

## 2018-02-12 NOTE — Progress Notes (Signed)
10/28/20199:27 AM  Micheal Roberts 04-04-1971, 47 y.o. male [id number]  Chief Complaint  Patient presents with  . Urticaria    recheck from 10/24. pt not better  . Facial Swelling    x sat    HPI:   Patient is a 47 y.o. male who presents today for follouwp on rash  Seen on 02/08/18 by [name] Diagnosed with uritcaria, given solumedrol IM, pred oral, visatril po Rash started before doxycycline  Originally seen at urgent care for cough Given tessalon pearls x 1 week Then broke out on rash, around his stomach, itchy Then went to ER and thought it was yeast given, nystatin given for rash but also thought to have cellulitis of left hand - given doxy, stopped on 10/24, hand much better Then he broke out in full body rash, seen here on 10/24 On Saturday it was almost fully cleared On Sunday got worse, puffy eyes, swollen lip Still has 2 days of prednisone, 10mg   No new foods, products, denies any new exposures Taking vistaril daily    Fall Risk  02/08/2018 12/05/2016 06/21/2016 12/01/2015  Falls in the past year? No No No No     Depression screen [facility] 2/9 02/08/2018 12/05/2016 06/21/2016  Decreased Interest 0 0 0  Down, Depressed, Hopeless 0 0 0  PHQ - 2 Score 0 0 0    No Known Allergies  Prior to Admission medications   Medication Sig Start Date End Date Taking? Authorizing Provider  doxycycline (VIBRAMYCIN) 100 MG capsule Take 1 capsule (100 mg total) by mouth 2 (two) times daily. 02/07/18  Yes Ward, Delice Bison, DO  hydrOXYzine (ATARAX/VISTARIL) 25 MG tablet Take 1 tablet (25 mg total) by mouth every 6 (six) hours as needed for itching. 02/08/18  Yes McVey, Gelene Mink, PA-C  Multiple Vitamin (MULTIVITAMIN) tablet Take 1 tablet by mouth daily. Reported on 05/25/2015   Yes [provider]  nystatin-triamcinolone (MYCOLOG II) cream Apply to affected area twice daily 02/07/18  Yes Ward, Cyril Mourning N, DO  ondansetron (ZOFRAN ODT) 4 MG disintegrating tablet Take 1  tablet (4 mg total) by mouth every 6 (six) hours as needed. 02/07/18  Yes Ward, Delice Bison, DO  predniSONE (DELTASONE) 20 MG tablet Take 3 PO QAM x3days, 2 PO QAM x3days, 1 PO QAM x3days 02/08/18  Yes McVey, Gelene Mink, PA-C  clonazePAM (KLONOPIN) 0.5 MG tablet Take 1 tablet (0.5 mg total) by mouth 2 (two) times daily as needed. for anxiety Patient not taking: Reported on 01/31/2018 12/05/16   Wendie Agreste, MD  FLUoxetine (PROZAC) 20 MG tablet Take 1 tablet (20 mg total) by mouth daily. Patient not taking: Reported on 01/31/2018 12/05/16   Wendie Agreste, MD    Past Medical History:  Diagnosis Date  . Anxiety   . IBS (irritable bowel syndrome)     Past Surgical History:  Procedure Laterality Date  . left ACL arthroscopy      Social History   Tobacco Use  . Smoking status: Current Every Day Smoker    Packs/day: 0.50    Years: 10.00    Pack years: 5.00    Types: Cigarettes  . Smokeless tobacco: Never Used  Substance Use Topics  . Alcohol use: Yes    Alcohol/week: 0.0 standard drinks    Comment: beer - only on ocassions    Family History  Problem Relation Age of Onset  . Hypertension Mother   . Anemia Mother   . Thyroid disease Father   .  Hypertension Father     Review of Systems  Constitutional: Negative for chills and fever.  HENT:       Denies any problems with swallowing  Eyes: Negative for blurred vision and double vision.  Respiratory: Negative for cough, shortness of breath and wheezing.   Cardiovascular: Negative for chest pain and palpitations.  Gastrointestinal: Negative for abdominal pain, nausea and vomiting.  Skin: Positive for itching and rash.     OBJECTIVE:  Blood pressure (!) 165/59, pulse (!) 101, temperature 98.2 F (36.8 C), temperature source Oral, resp. rate 16, height 6\' 1"  (1.854 m), weight 232 lb (105.2 kg), SpO2 98 %. Body mass index is 30.61 kg/m.   BP Readings from Last 3 Encounters:  02/12/18 (!) 165/59  02/08/18  120/76  02/07/18 (!) 172/110    Physical Exam  Constitutional: He is oriented to person, place, and time. He appears well-developed and well-nourished.  HENT:  Head: Normocephalic and atraumatic.  Mouth/Throat: Oropharynx is clear and moist and mucous membranes are normal. No uvula swelling. No posterior oropharyngeal edema.  Right upper lip swelling  Eyes: Pupils are equal, round, and reactive to light. Conjunctivae and EOM are normal.  Left periorbital swelling  Neck: Neck supple.  Cardiovascular: Normal rate and regular rhythm. Exam reveals no gallop and no friction rub.  No murmur heard. Pulmonary/Chest: Effort normal and breath sounds normal. He has no wheezes. He has no rales.  Musculoskeletal: He exhibits no edema.  Neurological: He is alert and oriented to person, place, and time.  Skin: Skin is warm and dry.  Erythematous discrete plaques along axillas and upper extremities  Psychiatric: He has a normal mood and affect.  Nursing note and vitals reviewed.    ASSESSMENT and PLAN  1. Angioedema, initial encounter Given facial swelling will redose prednisone. No airway compromise at this time. Strict ER precautions given,  Continue with vistaril. Add pepcid.  - hydrOXYzine (ATARAX/VISTARIL) 25 MG tablet; Take 1 tablet (25 mg total) by mouth every 6 (six) hours as needed for itching. - predniSONE (DELTASONE) 20 MG tablet; Take 3 PO QAM x3days, 2 PO QAM x3days, 1 PO QAM x3days - methylPREDNISolone acetate (DEPO-MEDROL) injection 80 mg  2. Elevated BP without diagnosis of hypertension - Care order/instruction:  Other orders - famotidine (PEPCID) 20 MG tablet; Take 1 tablet (20 mg total) by mouth 2 (two) times daily.  Return if symptoms worsen or fail to improve, for BP check.    Rutherford Guys, MD [facility] at [place], [place] [address] Ph.  [phone] Fax [phone]

## 2018-02-23 ENCOUNTER — Encounter: Payer: BC Managed Care – PPO | Admitting: Family Medicine

## 2018-04-05 ENCOUNTER — Ambulatory Visit (INDEPENDENT_AMBULATORY_CARE_PROVIDER_SITE_OTHER): Payer: BC Managed Care – PPO | Admitting: Family Medicine

## 2018-04-05 ENCOUNTER — Encounter: Payer: Self-pay | Admitting: Family Medicine

## 2018-04-05 ENCOUNTER — Other Ambulatory Visit: Payer: Self-pay

## 2018-04-05 VITALS — BP 142/90 | HR 83 | Temp 98.5°F | Ht 73.0 in | Wt 239.4 lb

## 2018-04-05 DIAGNOSIS — Z6831 Body mass index (BMI) 31.0-31.9, adult: Secondary | ICD-10-CM

## 2018-04-05 DIAGNOSIS — E6609 Other obesity due to excess calories: Secondary | ICD-10-CM | POA: Diagnosis not present

## 2018-04-05 DIAGNOSIS — R030 Elevated blood-pressure reading, without diagnosis of hypertension: Secondary | ICD-10-CM

## 2018-04-05 DIAGNOSIS — Z0000 Encounter for general adult medical examination without abnormal findings: Secondary | ICD-10-CM

## 2018-04-05 DIAGNOSIS — Z23 Encounter for immunization: Secondary | ICD-10-CM

## 2018-04-05 DIAGNOSIS — Z0001 Encounter for general adult medical examination with abnormal findings: Secondary | ICD-10-CM | POA: Diagnosis not present

## 2018-04-05 DIAGNOSIS — Z1329 Encounter for screening for other suspected endocrine disorder: Secondary | ICD-10-CM

## 2018-04-05 DIAGNOSIS — Z131 Encounter for screening for diabetes mellitus: Secondary | ICD-10-CM

## 2018-04-05 DIAGNOSIS — Z13220 Encounter for screening for lipoid disorders: Secondary | ICD-10-CM

## 2018-04-05 DIAGNOSIS — Z125 Encounter for screening for malignant neoplasm of prostate: Secondary | ICD-10-CM

## 2018-04-05 NOTE — Patient Instructions (Addendum)
Cut back on sodas, cut back on sweet tea or mix sweet tea with unsweet tea until you can transition to water as your main beverage.  Water is best.  Avoid fast foods as much as possible. Advanced meal planning can help. Pack a lunch with healthier options.   Exercise 150 minutes per week - low intensity like walking  Recheck weight and blood pressure in 3 months.   Keeping you healthy  Get these tests  Blood pressure- Have your blood pressure checked once a year by your healthcare provider.  Normal blood pressure is 120/80.  Weight- Have your body mass index (BMI) calculated to screen for obesity.  BMI is a measure of body fat based on height and weight. You can also calculate your own BMI at [employer].  Cholesterol- Have your cholesterol checked regularly starting at age 54, sooner may be necessary if you have diabetes, high blood pressure, if a family member developed heart diseases at an early age or if you smoke.   Chlamydia, HIV, and other sexual transmitted disease- Get screened each year until the age of 50 then within three months of each new sexual partner.  Diabetes- Have your blood sugar checked regularly if you have high blood pressure, high cholesterol, a family history of diabetes or if you are overweight.  Get these vaccines  Flu shot- Every fall.  Tetanus shot- Every 10 years.  Menactra- Single dose; prevents meningitis.  Take these steps  Don't smoke- If you do smoke, ask your healthcare provider about quitting. For tips on how to quit, go to www.smokefree.gov or call 1-800-QUIT-NOW.  Be physically active- Exercise 5 days a week for at least 30 minutes.  If you are not already physically active start slow and gradually work up to 30 minutes of moderate physical activity.  Examples of moderate activity include walking briskly, mowing the yard, dancing, swimming bicycling, etc.  Eat a healthy diet- Eat a variety of healthy foods such as fruits,  vegetables, low fat milk, low fat cheese, yogurt, lean meats, poultry, fish, beans, tofu, etc.  For more information on healthy eating, go to www.thenutritionsource.org  Drink alcohol in moderation- Limit alcohol intake two drinks or less a day.  Never drink and drive.  Dentist- Brush and floss teeth twice daily; visit your dentis twice a year.  Depression-Your emotional health is as important as your physical health.  If you're feeling down, losing interest in things you normally enjoy please talk with your healthcare provider.  Gun Safety- If you keep a gun in your home, keep it unloaded and with the safety lock on.  Bullets should be stored separately.  Helmet use- Always wear a helmet when riding a motorcycle, bicycle, rollerblading or skateboarding.  Safe sex- If you may be exposed to a sexually transmitted infection, use a condom  Seat belts- Seat bels can save your life; always wear one.  Smoke/Carbon Monoxide detectors- These detectors need to be installed on the appropriate level of your home.  Replace batteries at least once a year.  Skin Cancer- When out in the sun, cover up and use sunscreen SPF 15 or higher.  Violence- If anyone is threatening or hurting you, please tell your healthcare provider.   If you have lab work done today you will be contacted with your lab results within the next 2 weeks.  If you have not heard from [place] then please contact us. The fastest way to get your results is to register for My Chart.  IF you received an x-ray today, you will receive an invoice from [facility]. Please contact [facility] at [phone] with questions or concerns regarding your invoice.   IF you received labwork today, you will receive an invoice from [place]. Please contact [facility] at [phone] with questions or concerns regarding your invoice.   Our billing staff will not be able to assist you with questions regarding bills from these  companies.  You will be contacted with the lab results as soon as they are available. The fastest way to get your results is to activate your My Chart account. Instructions are located on the last page of this paperwork. If you have not heard from [place] regarding the results in 2 weeks, please contact this office.

## 2018-04-05 NOTE — Progress Notes (Signed)
Subjective:    Patient ID: Micheal Roberts, male    DOB: [birth year], 47 y.o.   MRN: [id number]  HPI Jeffren Dombek is a 47 y.o. male Presents today for: Chief Complaint  Patient presents with  . Annual Exam    CPE   Currently off meds. Last physical 11/2016.   Tobacco abuse: Quit smoking - October of last year.  Breathing bettter off tobacco.   Seen for angioedema 10/24, 10/28.  Doing ok since then.   Immunization History  Administered Date(s) Administered  . Influenza,inj,Quad PF,6+ Mos 05/25/2015, 06/21/2016, 04/05/2018  ] No FH of prostate CA or colon CA. Would like prostate cancer testing today.   Hypertension/elevated BP.  No meds. Weight is up.  BP Readings from Last 3 Encounters:  04/05/18 (!) 145/96  02/12/18 (!) 165/59  02/08/18 120/76   Lab Results  Component Value Date   CREATININE 1.20 12/05/2016   Anxiety: Discussed the physical last August Klonopin used few times per week in the past, Zoloft in the past as well.  Did not tolerate Zoloft.  Also tried Wellbutrin in the past, and was being used he also help with smoking.  Possible increased anxiety with Wellbutrin.  Started on Prozac, plan for follow-up in 3 to 4 weeks.  Klonopin also provided for breakthrough symptoms.  He has not seen me since August of last year.   No recent meds.  Anxiety doing ok.  Depression screen [facility] 2/9 04/05/2018 02/08/2018 12/05/2016 06/21/2016 12/01/2015  Decreased Interest 0 0 0 0 0  Down, Depressed, Hopeless 0 0 0 0 0  PHQ - 2 Score 0 0 0 0 0  No flowsheet data found.  Exercise: lifts weights once per week. Active/physical job with the county.   Obesity: Fast food daily.  Sweet tea -"a lot" and a lot of soda - daily.  Body mass index is 31.59 kg/m. Wt Readings from Last 3 Encounters:  04/05/18 239 lb 6.4 oz (108.6 kg)  02/12/18 232 lb (105.2 kg)  02/08/18 226 lb 6.4 oz (102.7 kg)   Declines sti testing.   Dentist Q70mo   Visual Acuity Screening   Right eye Left  eye Both eyes  Without correction: 20/20 20/20 20/15-1  With correction:     no glasses/contacts.     There are no active problems to display for this patient.  Past Medical History:  Diagnosis Date  . Anxiety   . IBS (irritable bowel syndrome)    Past Surgical History:  Procedure Laterality Date  . left ACL arthroscopy     Allergies  Allergen Reactions  . Benzonatate Anaphylaxis   Prior to Admission medications   Not on File   Social History   Socioeconomic History  . Marital status: Married    Spouse name: Not on file  . Number of children: Not on file  . Years of education: Not on file  . Highest education level: Not on file  Occupational History  . Not on file  Social Needs  . Financial resource strain: Not on file  . Food insecurity:    Worry: Not on file    Inability: Not on file  . Transportation needs:    Medical: Not on file    Non-medical: Not on file  Tobacco Use  . Smoking status: Never Smoker  . Smokeless tobacco: Never Used  Substance and Sexual Activity  . Alcohol use: Yes    Alcohol/week: 0.0 standard drinks    Comment: beer - only on ocassions  .  Drug use: No  . Sexual activity: Yes  Lifestyle  . Physical activity:    Days per week: Not on file    Minutes per session: Not on file  . Stress: Not on file  Relationships  . Social connections:    Talks on phone: Not on file    Gets together: Not on file    Attends religious service: Not on file    Active member of club or organization: Not on file    Attends meetings of clubs or organizations: Not on file    Relationship status: Not on file  . Intimate partner violence:    Fear of current or ex partner: Not on file    Emotionally abused: Not on file    Physically abused: Not on file    Forced sexual activity: Not on file  Other Topics Concern  . Not on file  Social History Narrative  . Not on file    Review of Systems  Constitutional: Negative for fatigue and unexpected weight  change.  Eyes: Negative for visual disturbance.  Respiratory: Negative for cough, chest tightness and shortness of breath.   Cardiovascular: Negative for chest pain, palpitations and leg swelling.  Gastrointestinal: Negative for abdominal pain and blood in stool.  Neurological: Negative for dizziness, light-headedness and headaches.       Objective:   Physical Exam Vitals signs reviewed.  Constitutional:      Appearance: He is well-developed.  HENT:     Head: Normocephalic and atraumatic.     Right Ear: External ear normal.     Left Ear: External ear normal.  Eyes:     Conjunctiva/sclera: Conjunctivae normal.     Pupils: Pupils are equal, round, and reactive to light.  Neck:     Musculoskeletal: Normal range of motion and neck supple.     Thyroid: No thyromegaly.  Cardiovascular:     Rate and Rhythm: Normal rate and regular rhythm.     Heart sounds: Normal heart sounds.  Pulmonary:     Effort: Pulmonary effort is normal. No respiratory distress.     Breath sounds: Normal breath sounds. No wheezing.  Abdominal:     General: There is no distension.     Palpations: Abdomen is soft.     Tenderness: There is no abdominal tenderness.     Hernia: There is no hernia in the right inguinal area or left inguinal area.  Genitourinary:    Prostate: Normal.  Musculoskeletal: Normal range of motion.        General: No tenderness.  Lymphadenopathy:     Cervical: No cervical adenopathy.  Skin:    General: Skin is warm and dry.  Neurological:     Mental Status: He is alert and oriented to person, place, and time.     Deep Tendon Reflexes: Reflexes are normal and symmetric.  Psychiatric:        Behavior: Behavior normal.    Vitals:   04/05/18 1639  BP: (!) 145/96  Pulse: 83  Temp: 98.5 F (36.9 C)  TempSrc: Oral  SpO2: 97%  Weight: 239 lb 6.4 oz (108.6 kg)  Height: 6\' 1"  (1.854 m)      Assessment & Plan:  Velmer Woelfel is a 47 y.o. male Annual physical  exam  -anticipatory guidance as below in AVS, screening labs above. Health maintenance items as above in HPI discussed/recommended as applicable.   Need for influenza vaccination - Plan: Flu Vaccine QUAD 36+ mos IM  Class 1 obesity  due to excess calories without serious comorbidity with body mass index (BMI) of 31.0 to 31.9 in adult  - diet/exercise discussed. Initial decrease in sugar containing beverages, advanced meal planning for healthier choices.   Screening for hyperlipidemia - Plan: Lipid panel  Screening for diabetes mellitus - Plan: Hemoglobin A1c, Comprehensive metabolic panel  Elevated blood pressure reading - Plan: Comprehensive metabolic panel  - diet changes. Low intensity exercise discussed. Recheck in next 3 months.   Screening for thyroid disorder - Plan: TSH  Screening for prostate cancer - Plan: PSA  - We discussed pros and cons of prostate cancer screening, and after this discussion, he chose to have screening done. PSA obtained, and no concerning findings on DRE.    No orders of the defined types were placed in this encounter.  Patient Instructions    Cut back on sodas, cut back on sweet tea or mix sweet tea with unsweet tea until you can transition to water as your main beverage.  Water is best.  Avoid fast foods as much as possible. Advanced meal planning can help. Pack a lunch with healthier options.   Exercise 150 minutes per week - low intensity like walking  Recheck weight and blood pressure in 3 months.   Keeping you healthy  Get these tests  Blood pressure- Have your blood pressure checked once a year by your healthcare provider.  Normal blood pressure is 120/80.  Weight- Have your body mass index (BMI) calculated to screen for obesity.  BMI is a measure of body fat based on height and weight. You can also calculate your own BMI at [employer].it.  Cholesterol- Have your cholesterol checked regularly starting at age 50, sooner may be  necessary if you have diabetes, high blood pressure, if a family member developed heart diseases at an early age or if you smoke.   Chlamydia, HIV, and other sexual transmitted disease- Get screened each year until the age of 75 then within three months of each new sexual partner.  Diabetes- Have your blood sugar checked regularly if you have high blood pressure, high cholesterol, a family history of diabetes or if you are overweight.  Get these vaccines  Flu shot- Every fall.  Tetanus shot- Every 10 years.  Menactra- Single dose; prevents meningitis.  Take these steps  Don't smoke- If you do smoke, ask your healthcare provider about quitting. For tips on how to quit, go to www.smokefree.gov or call 1-800-QUIT-NOW.  Be physically active- Exercise 5 days a week for at least 30 minutes.  If you are not already physically active start slow and gradually work up to 30 minutes of moderate physical activity.  Examples of moderate activity include walking briskly, mowing the yard, dancing, swimming bicycling, etc.  Eat a healthy diet- Eat a variety of healthy foods such as fruits, vegetables, low fat milk, low fat cheese, yogurt, lean meats, poultry, fish, beans, tofu, etc.  For more information on healthy eating, go to www.thenutritionsource.org  Drink alcohol in moderation- Limit alcohol intake two drinks or less a day.  Never drink and drive.  Dentist- Brush and floss teeth twice daily; visit your dentis twice a year.  Depression-Your emotional health is as important as your physical health.  If you're feeling down, losing interest in things you normally enjoy please talk with your healthcare provider.  Gun Safety- If you keep a gun in your home, keep it unloaded and with the safety lock on.  Bullets should be stored separately.  Helmet use-  Always wear a helmet when riding a motorcycle, bicycle, rollerblading or skateboarding.  Safe sex- If you may be exposed to a sexually transmitted  infection, use a condom  Seat belts- Seat bels can save your life; always wear one.  Smoke/Carbon Monoxide detectors- These detectors need to be installed on the appropriate level of your home.  Replace batteries at least once a year.  Skin Cancer- When out in the sun, cover up and use sunscreen SPF 15 or higher.  Violence- If anyone is threatening or hurting you, please tell your healthcare provider.   If you have lab work done today you will be contacted with your lab results within the next 2 weeks.  If you have not heard from [place] then please contact us. The fastest way to get your results is to register for My Chart.   IF you received an x-ray today, you will receive an invoice from [facility]. Please contact [facility] at [phone] with questions or concerns regarding your invoice.   IF you received labwork today, you will receive an invoice from [place]. Please contact [facility] at [phone] with questions or concerns regarding your invoice.   Our billing staff will not be able to assist you with questions regarding bills from these companies.  You will be contacted with the lab results as soon as they are available. The fastest way to get your results is to activate your My Chart account. Instructions are located on the last page of this paperwork. If you have not heard from [place] regarding the results in 2 weeks, please contact this office.       Signed,   Merri Ray, MD [facility] at [place].  04/09/18 11:25 PM

## 2018-04-06 LAB — COMPREHENSIVE METABOLIC PANEL
ALT: 61 IU/L — ABNORMAL HIGH (ref 0–44)
AST: 36 IU/L (ref 0–40)
Albumin/Globulin Ratio: 1.5 (ref 1.2–2.2)
Albumin: 4.4 g/dL (ref 3.5–5.5)
Alkaline Phosphatase: 73 IU/L (ref 39–117)
BUN/Creatinine Ratio: 11 (ref 9–20)
BUN: 16 mg/dL (ref 6–24)
Bilirubin Total: 0.3 mg/dL (ref 0.0–1.2)
CO2: 25 mmol/L (ref 20–29)
Calcium: 9.6 mg/dL (ref 8.7–10.2)
Chloride: 100 mmol/L (ref 96–106)
Creatinine, Ser: 1.44 mg/dL — ABNORMAL HIGH (ref 0.76–1.27)
GFR calc Af Amer: 66 mL/min/{1.73_m2} (ref >59)
GFR calc non Af Amer: 57 mL/min/{1.73_m2} — ABNORMAL LOW (ref >59)
Globulin, Total: 3 g/dL (ref 1.5–4.5)
Glucose: 101 mg/dL — ABNORMAL HIGH (ref 65–99)
Potassium: 4.1 mmol/L (ref 3.5–5.2)
Sodium: 141 mmol/L (ref 134–144)
Total Protein: 7.4 g/dL (ref 6.0–8.5)

## 2018-04-06 LAB — LIPID PANEL
Chol/HDL Ratio: 6.5 ratio — ABNORMAL HIGH (ref 0.0–5.0)
Cholesterol, Total: 181 mg/dL (ref 100–199)
HDL: 28 mg/dL — ABNORMAL LOW (ref >39)
LDL Calculated: 105 mg/dL — ABNORMAL HIGH (ref 0–99)
Triglycerides: 242 mg/dL — ABNORMAL HIGH (ref 0–149)
VLDL Cholesterol Cal: 48 mg/dL — ABNORMAL HIGH (ref 5–40)

## 2018-04-06 LAB — PSA: Prostate Specific Ag, Serum: 0.2 ng/mL (ref 0.0–4.0)

## 2018-04-06 LAB — HEMOGLOBIN A1C
Est. average glucose Bld gHb Est-mCnc: 105 mg/dL
Hgb A1c MFr Bld: 5.3 % (ref 4.8–5.6)

## 2018-04-06 LAB — TSH: TSH: 1.32 u[IU]/mL (ref 0.450–4.500)

## 2019-04-17 ENCOUNTER — Other Ambulatory Visit: Payer: Self-pay

## 2019-04-17 ENCOUNTER — Telehealth (INDEPENDENT_AMBULATORY_CARE_PROVIDER_SITE_OTHER): Payer: BC Managed Care – PPO | Admitting: Adult Health Nurse Practitioner

## 2019-04-17 DIAGNOSIS — K589 Irritable bowel syndrome without diarrhea: Secondary | ICD-10-CM

## 2019-04-17 NOTE — Progress Notes (Signed)
Telemedicine Encounter- SOAP NOTE Established Patient  This telephone encounter was conducted with the patient's (or proxy's) verbal consent via audio telecommunications: yes/no: Yes Patient was instructed to have this encounter in a suitably private space; and to only have persons present to whom they give permission to participate. In addition, patient identity was confirmed by use of name plus two identifiers (DOB and address).  I discussed the limitations, risks, security and privacy concerns of performing an evaluation and management service by telephone and the availability of in person appointments. I also discussed with the patient that there may be a patient responsible charge related to this service. The patient expressed understanding and agreed to proceed.  I spent a total of TIME; 0 MIN TO 60 MIN: 10 minutes talking with the patient or their proxy.  Chief Complaint  Patient presents with  . Buring sensation in his stomach    x1 week and it is more intense. Has a problem with IBS    Subjective   Micheal Roberts is a 48 y.o. established patient. Telephone visit today for IBX--bloating/constipation, burning in stomach.   HPI  Reports stomach has been burning x 1 week.  Burns all of the time.  Not mitigated by eating or drinking.  Not worsened by either.  His last BM was today but has felt bloated and BMs are not as normal as previously.  He has taken no additional supplements or medications.   Denies blood in stool.  Reports he does have indigestion after eating sometimes.  No N/V, change in bowel or bladder symptoms.   Patient Active Problem List   Diagnosis Date Noted  . IBS (irritable bowel syndrome)     Past Medical History:  Diagnosis Date  . Anxiety   . IBS (irritable bowel syndrome)     No current outpatient medications on file.   No current facility-administered medications for this visit.    Allergies  Allergen Reactions  . Benzonatate Anaphylaxis     Social History   Socioeconomic History  . Marital status: Married    Spouse name: Not on file  . Number of children: Not on file  . Years of education: Not on file  . Highest education level: Not on file  Occupational History  . Not on file  Tobacco Use  . Smoking status: Never Smoker  . Smokeless tobacco: Never Used  Substance and Sexual Activity  . Alcohol use: Yes    Alcohol/week: 0.0 standard drinks    Comment: beer - only on ocassions  . Drug use: No  . Sexual activity: Yes  Other Topics Concern  . Not on file  Social History Narrative  . Not on file   Social Determinants of Health   Financial Resource Strain:   . Difficulty of Paying Living Expenses: Not on file  Food Insecurity:   . Worried About Charity fundraiser in the Last Year: Not on file  . Ran Out of Food in the Last Year: Not on file  Transportation Needs:   . Lack of Transportation (Medical): Not on file  . Lack of Transportation (Non-Medical): Not on file  Physical Activity:   . Days of Exercise per Week: Not on file  . Minutes of Exercise per Session: Not on file  Stress:   . Feeling of Stress : Not on file  Social Connections:   . Frequency of Communication with Friends and Family: Not on file  . Frequency of Social Gatherings with Friends and  Family: Not on file  . Attends Religious Services: Not on file  . Active Member of Clubs or Organizations: Not on file  . Attends Archivist Meetings: Not on file  . Marital Status: Not on file  Intimate Partner Violence:   . Fear of Current or Ex-Partner: Not on file  . Emotionally Abused: Not on file  . Physically Abused: Not on file  . Sexually Abused: Not on file    ROS  Review of Systems See HPI Constitution: No fevers or chills No malaise No diaphoresis Skin: No rash or itching Eyes: no blurry vision, no double vision GU: no dysuria or hematuria Neuro: no dizziness or headaches   Objective    General appearance:  oriented to person, place, and time. Mental Status: alert, oriented to person, place, and time.   Vitals as reported by the patient: There were no vitals filed for this visit.  [name] was seen today for buring sensation in his stomach.  Diagnoses and all orders for this visit:  Irritable bowel syndrome, unspecified type   Recommended adding OTC Miralax daily or every other day.  Also, could try Prilosec with burning and see if it also helped with his digestion.  He is inline with this plan.   I discussed the assessment and treatment plan with the patient. The patient was provided an opportunity to ask questions and all were answered. The patient agreed with the plan and demonstrated an understanding of the instructions.   The patient was advised to call back or seek an in-person evaluation if the symptoms worsen or if the condition fails to improve as anticipated.  I provided 10 minutes of non-face-to-face time during this encounter.  Glyn Ade, NP  Primary Care at [facility]

## 2019-04-17 NOTE — Patient Instructions (Signed)
° ° ° °  If you have lab work done today you will be contacted with your lab results within the next 2 weeks.  If you have not heard from us then please contact us. The fastest way to get your results is to register for My Chart. ° ° °IF you received an x-ray today, you will receive an invoice from [facility]. Please contact [facility] at [phone] with questions or concerns regarding your invoice.  ° °IF you received labwork today, you will receive an invoice from [facility]. Please contact [facility] at [phone] with questions or concerns regarding your invoice.  ° °Our billing staff will not be able to assist you with questions regarding bills from these companies. ° °You will be contacted with the lab results as soon as they are available. The fastest way to get your results is to activate your My Chart account. Instructions are located on the last page of this paperwork. If you have not heard from us regarding the results in 2 weeks, please contact this office. °  ° ° ° °

## 2019-04-22 ENCOUNTER — Ambulatory Visit: Payer: BC Managed Care – PPO | Admitting: Family Medicine

## 2019-04-25 ENCOUNTER — Telehealth: Payer: Self-pay | Admitting: Family Medicine

## 2019-04-25 ENCOUNTER — Ambulatory Visit (INDEPENDENT_AMBULATORY_CARE_PROVIDER_SITE_OTHER): Payer: BC Managed Care – PPO | Admitting: Family Medicine

## 2019-04-25 ENCOUNTER — Encounter: Payer: Self-pay | Admitting: Family Medicine

## 2019-04-25 ENCOUNTER — Encounter: Payer: Self-pay | Admitting: Gastroenterology

## 2019-04-25 ENCOUNTER — Other Ambulatory Visit: Payer: Self-pay

## 2019-04-25 VITALS — BP 128/86 | HR 77 | Temp 98.6°F | Ht 74.0 in | Wt 232.0 lb

## 2019-04-25 DIAGNOSIS — R140 Abdominal distension (gaseous): Secondary | ICD-10-CM | POA: Diagnosis not present

## 2019-04-25 DIAGNOSIS — R198 Other specified symptoms and signs involving the digestive system and abdomen: Secondary | ICD-10-CM

## 2019-04-25 DIAGNOSIS — Z23 Encounter for immunization: Secondary | ICD-10-CM

## 2019-04-25 DIAGNOSIS — R12 Heartburn: Secondary | ICD-10-CM | POA: Diagnosis not present

## 2019-04-25 DIAGNOSIS — Z0001 Encounter for general adult medical examination with abnormal findings: Secondary | ICD-10-CM

## 2019-04-25 DIAGNOSIS — Z0000 Encounter for general adult medical examination without abnormal findings: Secondary | ICD-10-CM

## 2019-04-25 DIAGNOSIS — K582 Mixed irritable bowel syndrome: Secondary | ICD-10-CM | POA: Diagnosis not present

## 2019-04-25 DIAGNOSIS — R7989 Other specified abnormal findings of blood chemistry: Secondary | ICD-10-CM

## 2019-04-25 MED ORDER — OMEPRAZOLE 20 MG PO CPDR: 20.0000 mg | DELAYED_RELEASE_CAPSULE | Freq: Every day | ORAL | 1 refills | Status: DC

## 2019-04-25 NOTE — Patient Instructions (Addendum)
Continue MiraLAX every day or every 2 days until bowel movements soft, more regular.  Stop if diarrhea.  Continue to drink additional fluids, fiber in the diet.  I will check an ultrasound, and recheck some liver tests as well as refer you to gastroenterology.  See information below on physical avoid for heartburn.  Start Prilosec once per day for now.  Recheck with me in the next 6 weeks.  I will check some other screening testsas we discussed today.  Keeping you healthy  Get these tests  Blood pressure- Have your blood pressure checked once a year by your healthcare provider.  Normal blood pressure is 120/80.  Weight- Have your body mass index (BMI) calculated to screen for obesity.  BMI is a measure of body fat based on height and weight. You can also calculate your own BMI at [email or web address].  Cholesterol- Have your cholesterol checked regularly starting at age 63, sooner may be necessary if you have diabetes, high blood pressure, if a family member developed heart diseases at an early age or if you smoke.   Chlamydia, HIV, and other sexual transmitted disease- Get screened each year until the age of 44 then within three months of each new sexual partner.  Diabetes- Have your blood sugar checked regularly if you have high blood pressure, high cholesterol, a family history of diabetes or if you are overweight.  Get these vaccines  Flu shot- Every fall.  Tetanus shot- Every 10 years.  Menactra- Single dose; prevents meningitis.  Take these steps  Don't smoke- If you do smoke, ask your healthcare provider about quitting. For tips on how to quit, go to www.smokefree.gov or call 1-800-QUIT-NOW.  Be physically active- Exercise 5 days a week for at least 30 minutes.  If you are not already physically active start slow and gradually work up to 30 minutes of moderate physical activity.  Examples of moderate activity include walking briskly, mowing the yard, dancing, swimming  bicycling, etc.  Eat a healthy diet- Eat a variety of healthy foods such as fruits, vegetables, low fat milk, low fat cheese, yogurt, lean meats, poultry, fish, beans, tofu, etc.  For more information on healthy eating, go to www.thenutritionsource.org  Drink alcohol in moderation- Limit alcohol intake two drinks or less a day.  Never drink and drive.  Dentist- Brush and floss teeth twice daily; visit your dentis twice a year.  Depression-Your emotional health is as important as your physical health.  If you're feeling down, losing interest in things you normally enjoy please talk with your healthcare provider.  Gun Safety- If you keep a gun in your home, keep it unloaded and with the safety lock on.  Bullets should be stored separately.  Helmet use- Always wear a helmet when riding a motorcycle, bicycle, rollerblading or skateboarding.  Safe sex- If you may be exposed to a sexually transmitted infection, use a condom  Seat belts- Seat bels can save your life; always wear one.  Smoke/Carbon Monoxide detectors- These detectors need to be installed on the appropriate level of your home.  Replace batteries at least once a year.  Skin Cancer- When out in the sun, cover up and use sunscreen SPF 15 or higher.  Violence- If anyone is threatening or hurting you, please tell your healthcare provider.  Abdominal Bloating When you have abdominal bloating, your abdomen may feel full, tight, or painful. It may also look bigger than normal or swollen (distended). Common causes of abdominal bloating include:  Swallowing air.  Constipation.  Problems digesting food.  Eating too much.  Irritable bowel syndrome. This is a condition that affects the large intestine.  Lactose intolerance. This is an inability to digest lactose, a natural sugar in dairy products.  Celiac disease. This is a condition that affects the ability to digest gluten, a protein found in some grains.  Gastroparesis. This is  a condition that slows down the movement of food in the stomach and small intestine. It is more common in people with diabetes mellitus.  Gastroesophageal reflux disease (GERD). This is a digestive condition that makes stomach acid flow back into the esophagus.  Urinary retention. This means that the body is holding onto urine, and the bladder cannot be emptied all the way. Follow these instructions at home: Eating and drinking  Avoid eating too much.  Try not to swallow air while talking or eating.  Avoid eating while lying down.  Avoid these foods and drinks: ? Foods that cause gas, such as broccoli, cabbage, cauliflower, and baked beans. ? Carbonated drinks. ? Hard candy. ? Chewing gum. Medicines  Take over-the-counter and prescription medicines only as told by your health care provider.  Take probiotic medicines. These medicines contain live bacteria or yeasts that can help digestion.  Take coated peppermint oil capsules. Activity  Try to exercise regularly. Exercise may help to relieve bloating that is caused by gas and relieve constipation. General instructions  Keep all follow-up visits as told by your health care provider. This is important. Contact a health care provider if:  You have nausea and vomiting.  You have diarrhea.  You have abdominal pain.  You have unusual weight loss or weight gain.  You have severe pain, and medicines do not help. Get help right away if:  You have severe chest pain.  You have trouble breathing.  You have shortness of breath.  You have trouble urinating.  You have darker urine than normal.  You have blood in your stools or have dark, tarry stools. Summary  Abdominal bloating means that the abdomen is swollen.  Common causes of abdominal bloating are swallowing air, constipation, and problems digesting food.  Avoid eating too much and avoid swallowing air.  Avoid foods that cause gas, carbonated drinks, hard candy,  and chewing gum. This information is not intended to replace advice given to you by your health care provider. Make sure you discuss any questions you have with your health care provider. Document Revised: 07/23/2018 Document Reviewed: 05/06/2016 Elsevier Patient Education  2020 [place] for Gastroesophageal Reflux Disease, Adult When you have gastroesophageal reflux disease (GERD), the foods you eat and your eating habits are very important. Choosing the right foods can help ease your discomfort. Think about working with a nutrition specialist (dietitian) to help you make good choices. What are tips for following this plan?  Meals  Choose healthy foods that are low in fat, such as fruits, vegetables, whole grains, low-fat dairy products, and lean meat, fish, and poultry.  Eat small meals often instead of 3 large meals a day. Eat your meals slowly, and in a place where you are relaxed. Avoid bending over or lying down until 2-3 hours after eating.  Avoid eating meals 2-3 hours before bed.  Avoid drinking a lot of liquid with meals.  Cook foods using methods other than frying. Bake, grill, or broil food instead.  Avoid or limit: ? Chocolate. ? Peppermint or spearmint. ? Alcohol. ? Pepper. ? Black and decaffeinated coffee. ?  Black and decaffeinated tea. ? Bubbly (carbonated) soft drinks. ? Caffeinated energy drinks and soft drinks.  Limit high-fat foods such as: ? Fatty meat or fried foods. ? Whole milk, cream, butter, or ice cream. ? Nuts and nut butters. ? Pastries, donuts, and sweets made with butter or shortening.  Avoid foods that cause symptoms. These foods may be different for everyone. Common foods that cause symptoms include: ? Tomatoes. ? Oranges, lemons, and limes. ? Peppers. ? Spicy food. ? Onions and garlic. ? Vinegar. Lifestyle  Maintain a healthy weight. Ask your doctor what weight is healthy for you. If you need to lose weight, work with  your doctor to do so safely.  Exercise for at least 30 minutes for 5 or more days each week, or as told by your doctor.  Wear loose-fitting clothes.  Do not smoke. If you need help quitting, ask your doctor.  Sleep with the head of your bed higher than your feet. Use a wedge under the mattress or blocks under the bed frame to raise the head of the bed. Summary  When you have gastroesophageal reflux disease (GERD), food and lifestyle choices are very important in easing your symptoms.  Eat small meals often instead of 3 large meals a day. Eat your meals slowly, and in a place where you are relaxed.  Limit high-fat foods such as fatty meat or fried foods.  Avoid bending over or lying down until 2-3 hours after eating.  Avoid peppermint and spearmint, caffeine, alcohol, and chocolate. This information is not intended to replace advice given to you by your health care provider. Make sure you discuss any questions you have with your health care provider. Document Revised: 07/26/2018 Document Reviewed: 05/10/2016 Elsevier Patient Education  [place].

## 2019-04-25 NOTE — Progress Notes (Signed)
Subjective:  Patient ID: Micheal Roberts, male    DOB: 04-06-1971  Age: 49 y.o. MRN: [id number]  CC:  Chief Complaint  Patient presents with  . Annual Exam    general health pt states he feels good, but he has been having bloating, constipation, and hear burn. this start around the begining of the month.     HPI Micheal Roberts presents for   Annual physical exam, last physical with me in December 2019.    IBS: History of irritable bowel syndrome, discussed with Jens Som, NP on December 30.  Stomach burning at that time.  Some bloating symptoms, irregular bowel movements.  Prilosec, over-the-counter MiraLAX recommended. Off and on bloating past few years with more constipation than diarrhea.  Tx: stool softener and fiber.  Has used miralax daily since last visit. BM today, every other day BM.  No wt loss/night sweats fevers, no blood in stool. Min nausea, no vomiting.  fullness sensation in R abdomen.   Heartburn: Few times per week. Taking tums, pepcid every few days with burning sensation in stomach. No one specific trigger. Did not try prilosec.  No tobacco, 1 cup coffee per day.   Cancer screening No FH of colon CA, or prostate CA.  R/b of prostate cancer testing discussed - defers testing at this time.  Lab Results  Component Value Date   PSA1 0.2 04/05/2018   PSA 0.25 07/25/2013   Immunization History  Administered Date(s) Administered  . Influenza,inj,Quad PF,6+ Mos 05/25/2015, 06/21/2016, 04/05/2018, 04/25/2019  flu vaccine today.  tdap - recommended. Deferred by patient.   Overweight: Body mass index is 29.79 kg/m. Wt Readings from Last 3 Encounters:  04/25/19 232 lb (105.2 kg)  04/05/18 239 lb 6.4 oz (108.6 kg)  02/12/18 232 lb (105.2 kg)   Depression screen [facility] 2/9 04/25/2019 04/17/2019 04/05/2018 02/08/2018 12/05/2016  Decreased Interest 0 0 0 0 0  Down, Depressed, Hopeless 0 0 0 0 0  PHQ - 2 Score 0 0 0 0 0   No exam data present No corrective  lenses.   Dental: Every 6 months.   Exercise: no regular exercise - walking at work - 7k steps per day, weights every 2 weeks.  Hyperlipidemia: No current meds. Quit smoking 3 years ago.  Lab Results  Component Value Date   CHOL 181 04/05/2018   HDL 28 (L) 04/05/2018   LDLCALC 105 (H) 04/05/2018   TRIG 242 (H) 04/05/2018   CHOLHDL 6.5 (H) 04/05/2018   Lab Results  Component Value Date   ALT 61 (H) 04/05/2018   AST 36 04/05/2018   ALKPHOS 73 04/05/2018   BILITOT 0.3 04/05/2018    Hyperglycemia.  Glucose 101 in 03/2018.  Lab Results  Component Value Date   HGBA1C 5.3 04/05/2018    Elevated creatinine, elevated Lft. Plan for 3 months recheck when  Cr 1.44 in 03/2018. No recent testing.   Lab Results  Component Value Date   ALT 61 (H) 04/05/2018   AST 36 04/05/2018   ALKPHOS 73 04/05/2018   BILITOT 0.3 04/05/2018   Lab Results  Component Value Date   CREATININE 1.44 (H) 04/05/2018   sti screening declined.   History Patient Active Problem List   Diagnosis Date Noted  . IBS (irritable bowel syndrome)    Past Medical History:  Diagnosis Date  . Anxiety   . IBS (irritable bowel syndrome)    Past Surgical History:  Procedure Laterality Date  . left ACL arthroscopy  Allergies  Allergen Reactions  . Benzonatate Anaphylaxis   Prior to Admission medications   Not on File   Social History   Socioeconomic History  . Marital status: Married    Spouse name: Not on file  . Number of children: Not on file  . Years of education: Not on file  . Highest education level: Not on file  Occupational History  . Not on file  Tobacco Use  . Smoking status: Never Smoker  . Smokeless tobacco: Never Used  Substance and Sexual Activity  . Alcohol use: Yes    Alcohol/week: 0.0 standard drinks    Comment: beer - only on ocassions  . Drug use: No  . Sexual activity: Yes  Other Topics Concern  . Not on file  Social History Narrative  . Not on file   Social  Determinants of Health   Financial Resource Strain:   . Difficulty of Paying Living Expenses: Not on file  Food Insecurity:   . Worried About Charity fundraiser in the Last Year: Not on file  . Ran Out of Food in the Last Year: Not on file  Transportation Needs:   . Lack of Transportation (Medical): Not on file  . Lack of Transportation (Non-Medical): Not on file  Physical Activity:   . Days of Exercise per Week: Not on file  . Minutes of Exercise per Session: Not on file  Stress:   . Feeling of Stress : Not on file  Social Connections:   . Frequency of Communication with Friends and Family: Not on file  . Frequency of Social Gatherings with Friends and Family: Not on file  . Attends Religious Services: Not on file  . Active Member of Clubs or Organizations: Not on file  . Attends Archivist Meetings: Not on file  . Marital Status: Not on file  Intimate Partner Violence:   . Fear of Current or Ex-Partner: Not on file  . Emotionally Abused: Not on file  . Physically Abused: Not on file  . Sexually Abused: Not on file    Review of Systems   Objective:   Vitals:   04/25/19 1338 04/25/19 1339  BP: (!) 142/92 128/86  Pulse: 77   Temp: 98.6 F (37 C)   TempSrc: Temporal   SpO2: 94%   Weight: 232 lb (105.2 kg)   Height: 6\' 2"  (1.88 m)      Physical Exam Vitals reviewed.  Constitutional:      Appearance: He is well-developed.  HENT:     Head: Normocephalic and atraumatic.     Right Ear: External ear normal.     Left Ear: External ear normal.  Eyes:     Conjunctiva/sclera: Conjunctivae normal.     Pupils: Pupils are equal, round, and reactive to light.  Neck:     Thyroid: No thyromegaly.  Cardiovascular:     Rate and Rhythm: Normal rate and regular rhythm.     Heart sounds: Normal heart sounds.  Pulmonary:     Effort: Pulmonary effort is normal. No respiratory distress.     Breath sounds: Normal breath sounds. No wheezing.  Abdominal:     General:  Abdomen is flat. There is no distension.     Palpations: Abdomen is soft. There is no mass.     Tenderness: There is no abdominal tenderness. There is no right CVA tenderness, guarding or rebound.     Comments: Reports fullness in R lower abdomen, no apparent mass palpated, nontender.  Musculoskeletal:        General: No tenderness. Normal range of motion.     Cervical back: Normal range of motion and neck supple.  Lymphadenopathy:     Cervical: No cervical adenopathy.  Skin:    General: Skin is warm and dry.  Neurological:     Mental Status: He is alert and oriented to person, place, and time.     Deep Tendon Reflexes: Reflexes are normal and symmetric.  Psychiatric:        Behavior: Behavior normal.        Assessment & Plan:  Micheal Roberts is a 49 y.o. male . Annual physical exam - Plan: Comprehensive metabolic panel, Lipid panel, Hemoglobin A1c  - -anticipatory guidance as below in AVS, screening labs above. Health maintenance items as above in HPI discussed/recommended as applicable.   Need for prophylactic vaccination and inoculation against influenza - Plan: Flu Vaccine QUAD 6+ mos PF IM (Fluarix Quad PF)  Need for prophylactic vaccination with combined diphtheria-tetanus-pertussis (DTP) vaccine  -declined at present.  Abdominal fullness in right lower quadrant - Plan: CBC, Ambulatory referral to Gastroenterology, US Abdomen Complete Bloating - Plan: Ambulatory referral to Gastroenterology, US Abdomen Complete Irritable bowel syndrome with both constipation and diarrhea - Plan: Ambulatory referral to Gastroenterology Elevated LFTs - Plan: US Abdomen Complete  -Suspected IBS, constipation predominant.  Reported right-sided fullness but I did not appreciate significant abnormality on exam.  Will schedule ultrasound given history of elevated LFT, with repeat CMP.  Check CBC, but unlikely infectious cause.  Continue MiraLAX for constipation, fluids and fiber in diet.  Refer to  gastroenterology.  Heartburn - Plan: Ambulatory referral to Gastroenterology, omeprazole (PRILOSEC) 20 MG capsule  -Trigger food avoidance discussed with handout given, trial of omeprazole daily, follow-up with gastroenterology planned.  Meds ordered this encounter  Medications  . omeprazole (PRILOSEC) 20 MG capsule    Sig: Take 1 capsule (20 mg total) by mouth daily.    Dispense:  30 capsule    Refill:  1   Patient Instructions   Continue MiraLAX every day or every 2 days until bowel movements soft, more regular.  Stop if diarrhea.  Continue to drink additional fluids, fiber in the diet.  I will check an ultrasound, and recheck some liver tests as well as refer you to gastroenterology.  See information below on physical avoid for heartburn.  Start Prilosec once per day for now.  Recheck with me in the next 6 weeks.  I will check some other screening testsas we discussed today.  Keeping you healthy  Get these tests  Blood pressure- Have your blood pressure checked once a year by your healthcare provider.  Normal blood pressure is 120/80.  Weight- Have your body mass index (BMI) calculated to screen for obesity.  BMI is a measure of body fat based on height and weight. You can also calculate your own BMI at [employer].it.  Cholesterol- Have your cholesterol checked regularly starting at age 49, sooner may be necessary if you have diabetes, high blood pressure, if a family member developed heart diseases at an early age or if you smoke.   Chlamydia, HIV, and other sexual transmitted disease- Get screened each year until the age of 31 then within three months of each new sexual partner.  Diabetes- Have your blood sugar checked regularly if you have high blood pressure, high cholesterol, a family history of diabetes or if you are overweight.  Get these vaccines  Flu shot- Every fall.  Tetanus shot- Every 10 years.  Menactra- Single dose; prevents meningitis.  Take  these steps  Don't smoke- If you do smoke, ask your healthcare provider about quitting. For tips on how to quit, go to www.smokefree.gov or call 1-800-QUIT-NOW.  Be physically active- Exercise 5 days a week for at least 30 minutes.  If you are not already physically active start slow and gradually work up to 30 minutes of moderate physical activity.  Examples of moderate activity include walking briskly, mowing the yard, dancing, swimming bicycling, etc.  Eat a healthy diet- Eat a variety of healthy foods such as fruits, vegetables, low fat milk, low fat cheese, yogurt, lean meats, poultry, fish, beans, tofu, etc.  For more information on healthy eating, go to www.thenutritionsource.org  Drink alcohol in moderation- Limit alcohol intake two drinks or less a day.  Never drink and drive.  Dentist- Brush and floss teeth twice daily; visit your dentis twice a year.  Depression-Your emotional health is as important as your physical health.  If you're feeling down, losing interest in things you normally enjoy please talk with your healthcare provider.  Gun Safety- If you keep a gun in your home, keep it unloaded and with the safety lock on.  Bullets should be stored separately.  Helmet use- Always wear a helmet when riding a motorcycle, bicycle, rollerblading or skateboarding.  Safe sex- If you may be exposed to a sexually transmitted infection, use a condom  Seat belts- Seat bels can save your life; always wear one.  Smoke/Carbon Monoxide detectors- These detectors need to be installed on the appropriate level of your home.  Replace batteries at least once a year.  Skin Cancer- When out in the sun, cover up and use sunscreen SPF 15 or higher.  Violence- If anyone is threatening or hurting you, please tell your healthcare provider.  Abdominal Bloating When you have abdominal bloating, your abdomen may feel full, tight, or painful. It may also look bigger than normal or swollen (distended).  Common causes of abdominal bloating include:  Swallowing air.  Constipation.  Problems digesting food.  Eating too much.  Irritable bowel syndrome. This is a condition that affects the large intestine.  Lactose intolerance. This is an inability to digest lactose, a natural sugar in dairy products.  Celiac disease. This is a condition that affects the ability to digest gluten, a protein found in some grains.  Gastroparesis. This is a condition that slows down the movement of food in the stomach and small intestine. It is more common in people with diabetes mellitus.  Gastroesophageal reflux disease (GERD). This is a digestive condition that makes stomach acid flow back into the esophagus.  Urinary retention. This means that the body is holding onto urine, and the bladder cannot be emptied all the way. Follow these instructions at home: Eating and drinking  Avoid eating too much.  Try not to swallow air while talking or eating.  Avoid eating while lying down.  Avoid these foods and drinks: ? Foods that cause gas, such as broccoli, cabbage, cauliflower, and baked beans. ? Carbonated drinks. ? Hard candy. ? Chewing gum. Medicines  Take over-the-counter and prescription medicines only as told by your health care provider.  Take probiotic medicines. These medicines contain live bacteria or yeasts that can help digestion.  Take coated peppermint oil capsules. Activity  Try to exercise regularly. Exercise may help to relieve bloating that is caused by gas and relieve constipation. General instructions  Keep all follow-up visits  as told by your health care provider. This is important. Contact a health care provider if:  You have nausea and vomiting.  You have diarrhea.  You have abdominal pain.  You have unusual weight loss or weight gain.  You have severe pain, and medicines do not help. Get help right away if:  You have severe chest pain.  You have trouble  breathing.  You have shortness of breath.  You have trouble urinating.  You have darker urine than normal.  You have blood in your stools or have dark, tarry stools. Summary  Abdominal bloating means that the abdomen is swollen.  Common causes of abdominal bloating are swallowing air, constipation, and problems digesting food.  Avoid eating too much and avoid swallowing air.  Avoid foods that cause gas, carbonated drinks, hard candy, and chewing gum. This information is not intended to replace advice given to you by your health care provider. Make sure you discuss any questions you have with your health care provider. Document Revised: 07/23/2018 Document Reviewed: 05/06/2016 Elsevier Patient Education  2020 [place] for Gastroesophageal Reflux Disease, Adult When you have gastroesophageal reflux disease (GERD), the foods you eat and your eating habits are very important. Choosing the right foods can help ease your discomfort. Think about working with a nutrition specialist (dietitian) to help you make good choices. What are tips for following this plan?  Meals  Choose healthy foods that are low in fat, such as fruits, vegetables, whole grains, low-fat dairy products, and lean meat, fish, and poultry.  Eat small meals often instead of 3 large meals a day. Eat your meals slowly, and in a place where you are relaxed. Avoid bending over or lying down until 2-3 hours after eating.  Avoid eating meals 2-3 hours before bed.  Avoid drinking a lot of liquid with meals.  Cook foods using methods other than frying. Bake, grill, or broil food instead.  Avoid or limit: ? Chocolate. ? Peppermint or spearmint. ? Alcohol. ? Pepper. ? Black and decaffeinated coffee. ? Black and decaffeinated tea. ? Bubbly (carbonated) soft drinks. ? Caffeinated energy drinks and soft drinks.  Limit high-fat foods such as: ? Fatty meat or fried foods. ? Whole milk, cream, butter,  or ice cream. ? Nuts and nut butters. ? Pastries, donuts, and sweets made with butter or shortening.  Avoid foods that cause symptoms. These foods may be different for everyone. Common foods that cause symptoms include: ? Tomatoes. ? Oranges, lemons, and limes. ? Peppers. ? Spicy food. ? Onions and garlic. ? Vinegar. Lifestyle  Maintain a healthy weight. Ask your doctor what weight is healthy for you. If you need to lose weight, work with your doctor to do so safely.  Exercise for at least 30 minutes for 5 or more days each week, or as told by your doctor.  Wear loose-fitting clothes.  Do not smoke. If you need help quitting, ask your doctor.  Sleep with the head of your bed higher than your feet. Use a wedge under the mattress or blocks under the bed frame to raise the head of the bed. Summary  When you have gastroesophageal reflux disease (GERD), food and lifestyle choices are very important in easing your symptoms.  Eat small meals often instead of 3 large meals a day. Eat your meals slowly, and in a place where you are relaxed.  Limit high-fat foods such as fatty meat or fried foods.  Avoid bending over or lying down  until 2-3 hours after eating.  Avoid peppermint and spearmint, caffeine, alcohol, and chocolate. This information is not intended to replace advice given to you by your health care provider. Make sure you discuss any questions you have with your health care provider. Document Revised: 07/26/2018 Document Reviewed: 05/10/2016 Elsevier Patient Education  2020 [employer].      Signed, Merri Ray, MD [facility]

## 2019-04-26 LAB — COMPREHENSIVE METABOLIC PANEL
ALT: 71 IU/L — ABNORMAL HIGH (ref 0–44)
AST: 39 IU/L (ref 0–40)
Albumin/Globulin Ratio: 2 (ref 1.2–2.2)
Albumin: 4.7 g/dL (ref 4.0–5.0)
Alkaline Phosphatase: 77 IU/L (ref 39–117)
BUN/Creatinine Ratio: 9 (ref 9–20)
BUN: 12 mg/dL (ref 6–24)
Bilirubin Total: 0.5 mg/dL (ref 0.0–1.2)
CO2: 26 mmol/L (ref 20–29)
Calcium: 9.8 mg/dL (ref 8.7–10.2)
Chloride: 98 mmol/L (ref 96–106)
Creatinine, Ser: 1.4 mg/dL — ABNORMAL HIGH (ref 0.76–1.27)
GFR calc Af Amer: 68 mL/min/{1.73_m2} (ref >59)
GFR calc non Af Amer: 59 mL/min/{1.73_m2} — ABNORMAL LOW (ref >59)
Globulin, Total: 2.4 g/dL (ref 1.5–4.5)
Glucose: 101 mg/dL — ABNORMAL HIGH (ref 65–99)
Potassium: 4 mmol/L (ref 3.5–5.2)
Sodium: 139 mmol/L (ref 134–144)
Total Protein: 7.1 g/dL (ref 6.0–8.5)

## 2019-04-26 LAB — CBC
Hematocrit: 39.4 % (ref 37.5–51.0)
Hemoglobin: 13.3 g/dL (ref 13.0–17.7)
MCH: 30.2 pg (ref 26.6–33.0)
MCHC: 33.8 g/dL (ref 31.5–35.7)
MCV: 90 fL (ref 79–97)
Platelets: 254 10*3/uL (ref 150–450)
RBC: 4.4 x10E6/uL (ref 4.14–5.80)
RDW: 11.9 % (ref 11.6–15.4)
WBC: 5.3 10*3/uL (ref 3.4–10.8)

## 2019-04-26 LAB — HEMOGLOBIN A1C
Est. average glucose Bld gHb Est-mCnc: 108 mg/dL
Hgb A1c MFr Bld: 5.4 % (ref 4.8–5.6)

## 2019-04-26 LAB — LIPID PANEL
Chol/HDL Ratio: 5.7 ratio — ABNORMAL HIGH (ref 0.0–5.0)
Cholesterol, Total: 164 mg/dL (ref 100–199)
HDL: 29 mg/dL — ABNORMAL LOW (ref >39)
LDL Chol Calc (NIH): 97 mg/dL (ref 0–99)
Triglycerides: 224 mg/dL — ABNORMAL HIGH (ref 0–149)
VLDL Cholesterol Cal: 38 mg/dL (ref 5–40)

## 2019-05-20 ENCOUNTER — Other Ambulatory Visit: Payer: BC Managed Care – PPO

## 2019-05-24 ENCOUNTER — Other Ambulatory Visit: Payer: Self-pay

## 2019-05-24 ENCOUNTER — Encounter: Payer: Self-pay | Admitting: Gastroenterology

## 2019-05-24 ENCOUNTER — Ambulatory Visit: Payer: BC Managed Care – PPO | Admitting: Gastroenterology

## 2019-05-24 VITALS — BP 112/72 | HR 91 | Temp 97.4°F | Ht 74.0 in | Wt 232.0 lb

## 2019-05-24 DIAGNOSIS — Z1211 Encounter for screening for malignant neoplasm of colon: Secondary | ICD-10-CM | POA: Diagnosis not present

## 2019-05-24 DIAGNOSIS — Z01818 Encounter for other preprocedural examination: Secondary | ICD-10-CM

## 2019-05-24 DIAGNOSIS — K5909 Other constipation: Secondary | ICD-10-CM

## 2019-05-24 MED ORDER — MOVIPREP 100 G PO SOLR: 1.0000 | ORAL | 0 refills | Status: DC

## 2019-05-24 NOTE — Patient Instructions (Signed)
If you are age 49 or older, your body mass index should be between 23-30. Your Body mass index is 29.79 kg/m. If this is out of the aforementioned range listed, please consider follow up with your Primary Care Provider.  If you are age 38 or younger, your body mass index should be between 19-25. Your Body mass index is 29.79 kg/m. If this is out of the aformentioned range listed, please consider follow up with your Primary Care Provider.   You have been scheduled for a colonoscopy. Please follow written instructions given to you at your visit today.  Please pick up your prep supplies at the pharmacy within the next 1-3 days. If you use inhalers (even only as needed), please bring them with you on the day of your procedure.  Please purchase the following medications over the counter and take as directed:  Please start taking citrucel (orange flavored) powder fiber supplement.  This may cause some bloating at first but that usually goes away. Begin with a small spoonful and work your way up to a large, heaping spoonful daily over a week.  Thank you, Dr Ardis Hughs

## 2019-05-24 NOTE — Progress Notes (Signed)
HPI: This is a very pleasant 49 year old man who was referred to me by Wendie Agreste, MD  to evaluate constipation, bloating.    Chief complaint is constipation, bloating  He was told he had irritable bowel many years ago related to some alternating constipation diarrhea, some lower abdominal pains that usually improves with moving his bowels.  Over the past few months however he has become more constipation predominant.  He has had to push and strain quite often to move his bowels.  He does not see any blood in his stool.  His weight is up 15 pounds in the past 6 months.  He does have some intermittent right lower quadrant discomforts which sometimes are improved when he moves his bowels.  He tried a pill fiber supplement and twice weekly MiraLAX with some initial improvement but not lasting.  Colon cancer does not run in his family  Old Data Reviewed:  Blood work January 2021 CBC normal, complete metabolic profile normal except for ALT of 71 and creatinine 1.4    Review of systems: Pertinent positive and negative review of systems were noted in the above HPI section. All other review negative.   Past Medical History:  Diagnosis Date  . Anxiety   . IBS (irritable bowel syndrome)     Past Surgical History:  Procedure Laterality Date  . left ACL arthroscopy      Current Outpatient Medications  Medication Sig Dispense Refill  . omeprazole (PRILOSEC) 20 MG capsule Take 1 capsule (20 mg total) by mouth daily. 30 capsule 1   No current facility-administered medications for this visit.    Allergies as of 05/24/2019 - Review Complete 05/24/2019  Allergen Reaction Noted  . Benzonatate Anaphylaxis 04/05/2018    Family History  Problem Relation Age of Onset  . Hypertension Mother   . Anemia Mother   . Thyroid disease Father   . Hypertension Father     Social History   Socioeconomic History  . Marital status: Married    Spouse name: Not on file  . Number of  children: Not on file  . Years of education: Not on file  . Highest education level: Not on file  Occupational History  . Not on file  Tobacco Use  . Smoking status: Never Smoker  . Smokeless tobacco: Never Used  Substance and Sexual Activity  . Alcohol use: Yes    Alcohol/week: 0.0 standard drinks    Comment: beer - only on ocassions  . Drug use: No  . Sexual activity: Yes  Other Topics Concern  . Not on file  Social History Narrative  . Not on file   Social Determinants of Health   Financial Resource Strain:   . Difficulty of Paying Living Expenses: Not on file  Food Insecurity:   . Worried About Charity fundraiser in the Last Year: Not on file  . Ran Out of Food in the Last Year: Not on file  Transportation Needs:   . Lack of Transportation (Medical): Not on file  . Lack of Transportation (Non-Medical): Not on file  Physical Activity:   . Days of Exercise per Week: Not on file  . Minutes of Exercise per Session: Not on file  Stress:   . Feeling of Stress : Not on file  Social Connections:   . Frequency of Communication with Friends and Family: Not on file  . Frequency of Social Gatherings with Friends and Family: Not on file  . Attends Religious Services: Not  on file  . Active Member of Clubs or Organizations: Not on file  . Attends Archivist Meetings: Not on file  . Marital Status: Not on file  Intimate Partner Violence:   . Fear of Current or Ex-Partner: Not on file  . Emotionally Abused: Not on file  . Physically Abused: Not on file  . Sexually Abused: Not on file     Physical Exam: BP 112/72   Pulse 91   Temp (!) 97.4 F (36.3 C)   Ht 6\' 2"  (1.88 m)   Wt 232 lb (105.2 kg)   BMI 29.79 kg/m  Constitutional: generally well-appearing Psychiatric: alert and oriented x3 Eyes: extraocular movements intact Mouth: oral pharynx moist, no lesions Neck: supple no lymphadenopathy Cardiovascular: heart regular rate and rhythm Lungs: clear to  auscultation bilaterally Abdomen: soft, nontender, nondistended, no obvious ascites, no peritoneal signs, normal bowel sounds Extremities: no lower extremity edema bilaterally Skin: no lesions on visible extremities   Assessment and plan: 49 y.o. male with constipation, bloating, routine risk for colon cancer  I think most likely his symptoms are functional, constipation predominant IBS.  I recommended fiber supplement change to Citrucel orange flavored powder fiber supplement.  I also recommended colon cancer screening with a colonoscopy at his soonest convenience.  I see no f reason for any further blood tests or imaging studies prior to then.    Please see the "Patient Instructions" section for addition details about the plan.   Owens Loffler, MD [facility] 05/24/2019, 3:05 PM  Cc: Wendie Agreste, MD  Total time on date of encounter was 45 minutes (this included time spent preparing to see the patient reviewing records; obtaining and/or reviewing separately obtained history; performing a medically appropriate exam and/or evaluation; counseling and educating the patient and family if present; ordering medications, tests or procedures if applicable; and documenting clinical information in the health record).

## 2019-05-27 ENCOUNTER — Other Ambulatory Visit: Payer: BC Managed Care – PPO

## 2019-07-05 ENCOUNTER — Encounter: Payer: BC Managed Care – PPO | Admitting: Gastroenterology

## 2019-07-30 NOTE — Telephone Encounter (Signed)
No additional notes

## 2019-08-12 ENCOUNTER — Encounter: Payer: Self-pay | Admitting: Gastroenterology

## 2019-08-14 ENCOUNTER — Telehealth: Payer: Self-pay | Admitting: Gastroenterology

## 2019-08-14 ENCOUNTER — Telehealth: Payer: Self-pay

## 2019-08-14 MED ORDER — MOVIPREP 100 G PO SOLR: 1.0000 | ORAL | 0 refills | Status: DC

## 2019-08-14 NOTE — Telephone Encounter (Signed)
Patient aware that Movi-Prep was sent to pharmacy as requested.

## 2019-08-14 NOTE — Telephone Encounter (Signed)
Called patient back in regards to covid testing. He verified that he received the fist dose of the [employer] vaccination. Explained to patient that he would have to have the covid test done prior to the procedure since he is not fully vaccinated. Informed patient that he is able to reschedule his procedure if necessary. He stated that he would reschedule his procedure to be after his second Pfizer dose. Transferred him to schedulers to have his procedure rescheduled to a later date.

## 2019-08-14 NOTE — Telephone Encounter (Signed)
Do we know what vaccination he had?  If it was the [employer] vaccination then he only needed 1 dose and he would be considered fully vaccinated now.  If that is the case then he is okay to proceed without testing.  If he had the [place] or [employer] vaccination and only received the first dose and not the second that he is not fully vaccinated and so he would need to have testing done before his [facility]  procedure.  If for whatever reason he cannot get testing done in time then the procedure should be rescheduled.  Can you call to try to hash that out?  There is certainly no emergency or even a urgency to his case so if it needs to be rescheduled to a later date that is certainly safe.   thanks

## 2019-08-14 NOTE — Telephone Encounter (Signed)
Pls send prep to [facility] on [address]. Pt states that he never picked up prep the first time that he scheduled procedure. His procedure is on Friday.

## 2019-08-14 NOTE — Telephone Encounter (Signed)
Called Micheal Roberts in regards to covid test. Micheal Roberts is scheduled to see Dr. Ardis Hughs on 08/16/19 at 0830. Pt stated that he received the first dose of the covid vaccination in mid April (He was not sure of the exact date). He said he has not received a phone call from the health department for scheduling of the second dose. He does not have a covid test scheduled before his procedure. He states that he might not be off in time for him to go get test done before the procedure, seeing how he gets off in between 3pm-3:30pm and the testing centers close at 4pm. Please advise?

## 2019-08-16 ENCOUNTER — Encounter: Payer: BC Managed Care – PPO | Admitting: Gastroenterology

## 2019-09-27 ENCOUNTER — Encounter: Payer: BC Managed Care – PPO | Admitting: Gastroenterology

## 2019-12-03 ENCOUNTER — Telehealth: Payer: Self-pay

## 2019-12-03 NOTE — Telephone Encounter (Signed)
Pt was scheduled to see Dr. Ardis Hughs on 12/04/19 at 1130. Called patient to verify if he had gotten his second dose of his Pfizer vaccine. Patient informed me that he had another death in the family. His son had passed away, and that he would call the office back to reschedule his procedure when he is ready.

## 2019-12-04 ENCOUNTER — Other Ambulatory Visit: Payer: Self-pay

## 2019-12-04 ENCOUNTER — Other Ambulatory Visit: Payer: Self-pay | Admitting: Family Medicine

## 2019-12-04 ENCOUNTER — Ambulatory Visit: Payer: Self-pay

## 2019-12-04 ENCOUNTER — Encounter: Payer: BC Managed Care – PPO | Admitting: Gastroenterology

## 2019-12-04 DIAGNOSIS — M79671 Pain in right foot: Secondary | ICD-10-CM

## 2020-03-23 ENCOUNTER — Telehealth (INDEPENDENT_AMBULATORY_CARE_PROVIDER_SITE_OTHER): Payer: BC Managed Care – PPO | Admitting: Emergency Medicine

## 2020-03-23 ENCOUNTER — Encounter: Payer: Self-pay | Admitting: Emergency Medicine

## 2020-03-23 ENCOUNTER — Other Ambulatory Visit: Payer: Self-pay

## 2020-03-23 VITALS — Ht 73.0 in | Wt 220.0 lb

## 2020-03-23 DIAGNOSIS — J22 Unspecified acute lower respiratory infection: Secondary | ICD-10-CM

## 2020-03-23 DIAGNOSIS — R059 Cough, unspecified: Secondary | ICD-10-CM

## 2020-03-23 MED ORDER — AZITHROMYCIN 250 MG PO TABS: ORAL_TABLET | ORAL | 0 refills | Status: DC

## 2020-03-23 MED ORDER — HYDROCODONE-HOMATROPINE 5-1.5 MG/5ML PO SYRP: 5.0000 mL | ORAL_SOLUTION | Freq: Every evening | ORAL | 0 refills | Status: DC | PRN

## 2020-03-23 NOTE — Patient Instructions (Signed)
° ° ° °  If you have lab work done today you will be contacted with your lab results within the next 2 weeks.  If you have not heard from us then please contact us. The fastest way to get your results is to register for My Chart. ° ° °IF you received an x-ray today, you will receive an invoice from [facility]. Please contact [facility] at [phone] with questions or concerns regarding your invoice.  ° °IF you received labwork today, you will receive an invoice from [facility]. Please contact [facility] at [phone] with questions or concerns regarding your invoice.  ° °Our billing staff will not be able to assist you with questions regarding bills from these companies. ° °You will be contacted with the lab results as soon as they are available. The fastest way to get your results is to activate your My Chart account. Instructions are located on the last page of this paperwork. If you have not heard from us regarding the results in 2 weeks, please contact this office. °  ° ° ° °

## 2020-03-23 NOTE — Progress Notes (Addendum)
Telemedicine Encounter- SOAP NOTE Established Patient Patient: Home  Provider: Office     This telephone encounter was conducted with the patient's (or proxy's) verbal consent via audio telecommunications: yes/no: Yes Patient was instructed to have this encounter in a suitably private space; and to only have persons present to whom they give permission to participate. In addition, patient identity was confirmed by use of name plus two identifiers (DOB and address).  I discussed the limitations, risks, security and privacy concerns of performing an evaluation and management service by telephone and the availability of in person appointments. I also discussed with the patient that there may be a patient responsible charge related to this service. The patient expressed understanding and agreed to proceed.  I spent a total of TIME; 0 MIN TO 60 MIN: 20 minutes talking with the patient or their proxy.  Chief Complaint  Patient presents with  . Fatigue    fatigue and coughing been going on since last thurs 03/20/20. No fever today     Subjective   Micheal Roberts is a 49 y.o. male established patient. Telephone visit today complaining of flulike symptoms that started 3 days ago and progressively getting worse.  Has fatigue and lack of appetite.  Constant cough.  Taking Mucinex.  Feels like this infection is "draining me".  No other significant symptoms.  Fully vaccinated against Covid.  Wife had similar symptoms several days ago.  HPI   Patient Active Problem List   Diagnosis Date Noted  . IBS (irritable bowel syndrome)     Past Medical History:  Diagnosis Date  . Anxiety   . IBS (irritable bowel syndrome)     No current outpatient medications on file.   No current facility-administered medications for this visit.    Allergies  Allergen Reactions  . Benzonatate Anaphylaxis    Social History   Socioeconomic History  . Marital status: Married    Spouse name: Not on file    . Number of children: Not on file  . Years of education: Not on file  . Highest education level: Not on file  Occupational History  . Not on file  Tobacco Use  . Smoking status: Never Smoker  . Smokeless tobacco: Never Used  Vaping Use  . Vaping Use: Never used  Substance and Sexual Activity  . Alcohol use: Yes    Alcohol/week: 0.0 standard drinks    Comment: beer - only on ocassions  . Drug use: No  . Sexual activity: Yes  Other Topics Concern  . Not on file  Social History Narrative  . Not on file   Social Determinants of Health   Financial Resource Strain:   . Difficulty of Paying Living Expenses: Not on file  Food Insecurity:   . Worried About Charity fundraiser in the Last Year: Not on file  . Ran Out of Food in the Last Year: Not on file  Transportation Needs:   . Lack of Transportation (Medical): Not on file  . Lack of Transportation (Non-Medical): Not on file  Physical Activity:   . Days of Exercise per Week: Not on file  . Minutes of Exercise per Session: Not on file  Stress:   . Feeling of Stress : Not on file  Social Connections:   . Frequency of Communication with Friends and Family: Not on file  . Frequency of Social Gatherings with Friends and Family: Not on file  . Attends Religious Services: Not on file  . Active  Member of Clubs or Organizations: Not on file  . Attends Archivist Meetings: Not on file  . Marital Status: Not on file  Intimate Partner Violence:   . Fear of Current or Ex-Partner: Not on file  . Emotionally Abused: Not on file  . Physically Abused: Not on file  . Sexually Abused: Not on file    Review of Systems  Constitutional: Positive for malaise/fatigue. Negative for chills and fever.  HENT: Positive for congestion. Negative for sore throat.   Respiratory: Positive for cough. Negative for sputum production and shortness of breath.   Cardiovascular: Negative.  Negative for chest pain and palpitations.   Gastrointestinal: Negative.  Negative for abdominal pain, diarrhea, nausea and vomiting.  Genitourinary: Negative.  Negative for dysuria and hematuria.  Musculoskeletal: Negative.   Skin: Negative.   Neurological: Positive for weakness.  All other systems reviewed and are negative.   Objective  Alert and oriented x3 in no apparent respiratory distress Vitals as reported by the patient: Today's Vitals   03/23/20 1437  Weight: 220 lb (99.8 kg)  Height: 6\' 1"  (1.854 m)    There are no diagnoses linked to this encounter. Bernon was seen today for fatigue.  Diagnoses and all orders for this visit:  Cough -     HYDROcodone-homatropine (HYCODAN) 5-1.5 MG/5ML syrup; Take 5 mLs by mouth at bedtime as needed for cough.  Lower respiratory infection -     azithromycin (ZITHROMAX) 250 MG tablet; Sig as indicated     I discussed the assessment and treatment plan with the patient. The patient was provided an opportunity to ask questions and all were answered. The patient agreed with the plan and demonstrated an understanding of the instructions.   The patient was advised to call back or seek an in-person evaluation if the symptoms worsen or if the condition fails to improve as anticipated.  I provided 20 minutes of non-face-to-face time during this encounter.  Horald Pollen, MD  Primary Care at [facility]

## 2020-08-28 ENCOUNTER — Telehealth: Payer: Self-pay | Admitting: Family Medicine

## 2020-08-28 NOTE — Telephone Encounter (Signed)
Pt reports burning in top of stomach and burping. Recommended try prilosec /omeprazole and can discuss Rx at appt next week.

## 2020-08-28 NOTE — Telephone Encounter (Signed)
Pt called in stating that he is having very bad acid reflux, he wanted to know what he could take OTC or if a prescription could be sent in for him. Pt has an upcoming appt next Thursday with Dr. Carlota Raspberry.

## 2020-09-03 ENCOUNTER — Encounter: Payer: Self-pay | Admitting: Family Medicine

## 2020-09-03 ENCOUNTER — Ambulatory Visit (INDEPENDENT_AMBULATORY_CARE_PROVIDER_SITE_OTHER): Payer: BC Managed Care – PPO | Admitting: Family Medicine

## 2020-09-03 ENCOUNTER — Other Ambulatory Visit: Payer: Self-pay

## 2020-09-03 VITALS — BP 126/74 | HR 72 | Temp 98.2°F | Resp 16 | Ht 73.0 in | Wt 231.8 lb

## 2020-09-03 DIAGNOSIS — Z23 Encounter for immunization: Secondary | ICD-10-CM

## 2020-09-03 DIAGNOSIS — Z0000 Encounter for general adult medical examination without abnormal findings: Secondary | ICD-10-CM

## 2020-09-03 DIAGNOSIS — R1011 Right upper quadrant pain: Secondary | ICD-10-CM

## 2020-09-03 DIAGNOSIS — R7989 Other specified abnormal findings of blood chemistry: Secondary | ICD-10-CM | POA: Diagnosis not present

## 2020-09-03 DIAGNOSIS — Z125 Encounter for screening for malignant neoplasm of prostate: Secondary | ICD-10-CM | POA: Diagnosis not present

## 2020-09-03 DIAGNOSIS — E781 Pure hyperglyceridemia: Secondary | ICD-10-CM | POA: Diagnosis not present

## 2020-09-03 DIAGNOSIS — Z6830 Body mass index (BMI) 30.0-30.9, adult: Secondary | ICD-10-CM

## 2020-09-03 DIAGNOSIS — Z130 Encounter for screening for diseases of the blood and blood-forming organs and certain disorders involving the immune mechanism: Secondary | ICD-10-CM | POA: Diagnosis not present

## 2020-09-03 DIAGNOSIS — Z1329 Encounter for screening for other suspected endocrine disorder: Secondary | ICD-10-CM

## 2020-09-03 DIAGNOSIS — E669 Obesity, unspecified: Secondary | ICD-10-CM

## 2020-09-03 DIAGNOSIS — R739 Hyperglycemia, unspecified: Secondary | ICD-10-CM | POA: Diagnosis not present

## 2020-09-03 LAB — COMPREHENSIVE METABOLIC PANEL
ALT: 49 U/L (ref 0–53)
AST: 25 U/L (ref 0–37)
Albumin: 4.6 g/dL (ref 3.5–5.2)
Alkaline Phosphatase: 51 U/L (ref 39–117)
BUN: 14 mg/dL (ref 6–23)
CO2: 28 mEq/L (ref 19–32)
Calcium: 9.4 mg/dL (ref 8.4–10.5)
Chloride: 102 mEq/L (ref 96–112)
Creatinine, Ser: 1.21 mg/dL (ref 0.40–1.50)
GFR: 70.1 mL/min (ref >60.00)
Glucose, Bld: 93 mg/dL (ref 70–99)
Potassium: 3.7 mEq/L (ref 3.5–5.1)
Sodium: 138 mEq/L (ref 135–145)
Total Bilirubin: 1 mg/dL (ref 0.2–1.2)
Total Protein: 7.1 g/dL (ref 6.0–8.3)

## 2020-09-03 LAB — LIPID PANEL
Cholesterol: 147 mg/dL (ref 0–200)
HDL: 32.7 mg/dL — ABNORMAL LOW (ref >39.00)
LDL Cholesterol: 89 mg/dL (ref 0–99)
NonHDL: 114.79
Total CHOL/HDL Ratio: 5
Triglycerides: 127 mg/dL (ref 0.0–149.0)
VLDL: 25.4 mg/dL (ref 0.0–40.0)

## 2020-09-03 LAB — CBC WITH DIFFERENTIAL/PLATELET
Basophils Absolute: 0.1 10*3/uL (ref 0.0–0.1)
Basophils Relative: 1.3 % (ref 0.0–3.0)
Eosinophils Absolute: 0.1 10*3/uL (ref 0.0–0.7)
Eosinophils Relative: 2.9 % (ref 0.0–5.0)
HCT: 39.1 % (ref 39.0–52.0)
Hemoglobin: 13.4 g/dL (ref 13.0–17.0)
Lymphocytes Relative: 36.8 % (ref 12.0–46.0)
Lymphs Abs: 1.6 10*3/uL (ref 0.7–4.0)
MCHC: 34.2 g/dL (ref 30.0–36.0)
MCV: 88.4 fl (ref 78.0–100.0)
Monocytes Absolute: 0.3 10*3/uL (ref 0.1–1.0)
Monocytes Relative: 6.8 % (ref 3.0–12.0)
Neutro Abs: 2.2 10*3/uL (ref 1.4–7.7)
Neutrophils Relative %: 52.2 % (ref 43.0–77.0)
Platelets: 241 10*3/uL (ref 150.0–400.0)
RBC: 4.42 Mil/uL (ref 4.22–5.81)
RDW: 13.4 % (ref 11.5–15.5)
WBC: 4.2 10*3/uL (ref 4.0–10.5)

## 2020-09-03 LAB — PSA: PSA: 0.15 ng/mL (ref 0.10–4.00)

## 2020-09-03 LAB — TSH: TSH: 1.06 u[IU]/mL (ref 0.35–4.50)

## 2020-09-03 LAB — HEMOGLOBIN A1C: Hgb A1c MFr Bld: 5.3 % (ref 4.6–6.5)

## 2020-09-03 NOTE — Progress Notes (Signed)
Subjective:  Patient ID: Micheal Roberts, male    DOB: November 12, 1970  Age: 50 y.o. MRN: [id number]  CC:  Chief Complaint  Patient presents with  . Annual Exam    Pt here for annual physical complains of burning sensation and bloating consistent, pt started prilosec OTC 7 days ago lessened the burning sensation but not gone.  Pt does report has IBS possible recent flare    HPI Micheal Roberts presents for   Annual exam and concerns above  Esophageal burning/burning sensation: Started approximately 1 week ago.  Burning sesnation lower right stomach. Chronic abdominal bloating - bloating for years with hx of IBS. no change in bloating symptoms. No f/c. Some nausea, no vomiting. Loose stools off and on for some time. No change in bowel movements. Still alternating constipation/diarrhea.  No blood in stool.  Burning comes and goes. Better at night, am. Worse middle of the day.  Started Prilosec otc once per day starting 1 week ago.  Improved but still some residual symptoms- still some burning. Able to eat. Better with meal at times.  Normal urination. No dysuria/hematuria.   History of IBS.  Plan last year for colonoscopy with Dr. Ardis Hughs, but had to reschedule - death in the family. Was seen last by GI in 05/2019. No scheduled appt with GI.  No fever/unexplained wt loss/night sweats.   Depression screen [facility] 2/9 03/23/2020 04/25/2019 04/17/2019 04/05/2018 02/08/2018  Decreased Interest 0 0 0 0 0  Down, Depressed, Hopeless 0 0 0 0 0  PHQ - 2 Score 0 0 0 0 0   Cancer screening: Colon - planning on scheduling colonoscopy as above.  Lab Results  Component Value Date   PSA1 0.2 04/05/2018   PSA 0.25 07/25/2013  The natural history of prostate cancer and ongoing controversy regarding screening and potential treatment outcomes of prostate cancer has been discussed with the patient. The meaning of a false positive PSA and a false negative PSA has been discussed. He indicates understanding of the  limitations of this screening test and wishes to proceed with screening PSA testing. Defers DRE   Immunization History  Administered Date(s) Administered  . Influenza,inj,Quad PF,6+ Mos 05/25/2015, 06/21/2016, 04/05/2018, 04/25/2019  covid vaccine last year, no booster.    Hearing Screening   125Hz  250Hz  500Hz  1000Hz  2000Hz  3000Hz  4000Hz  6000Hz  8000Hz   Right ear:           Left ear:             Visual Acuity Screening   Right eye Left eye Both eyes  Without correction: 20/20 20/15 20/15   With correction:     no corrective lenses.   Alcohol:none Smoking - quit 4 years. No cravings  No IDU.   Dentist - every 6 months   Body mass index is 30.58 kg/m. Exercise: walking on occasion, minimal exercise at this time.   Declines sti screening.    Hyperlipidemia: No current meds. Single elevated LFT - 61 in 2019, 71 in 2021.  Lab Results  Component Value Date   CHOL 164 04/25/2019   HDL 29 (L) 04/25/2019   LDLCALC 97 04/25/2019   TRIG 224 (H) 04/25/2019   CHOLHDL 5.7 (H) 04/25/2019   Lab Results  Component Value Date   ALT 71 (H) 04/25/2019   AST 39 04/25/2019   ALKPHOS 77 04/25/2019   BILITOT 0.5 04/25/2019   Elevated Creatinine: Lab Results  Component Value Date   CREATININE 1.40 (H) 04/25/2019  Has increased water intake. Taking nsaid  2 times per week. Normal UOP.   History Patient Active Problem List   Diagnosis Date Noted  . IBS (irritable bowel syndrome)    Past Medical History:  Diagnosis Date  . Anxiety   . IBS (irritable bowel syndrome)    Past Surgical History:  Procedure Laterality Date  . left ACL arthroscopy     Allergies  Allergen Reactions  . Benzonatate Anaphylaxis   Prior to Admission medications   Medication Sig Start Date End Date Taking? Authorizing Provider  omeprazole (PRILOSEC) 20 MG capsule Take 20 mg by mouth daily.   Yes [provider]   Social History   Socioeconomic History  . Marital status: Married    Spouse  name: Not on file  . Number of children: Not on file  . Years of education: Not on file  . Highest education level: Not on file  Occupational History  . Not on file  Tobacco Use  . Smoking status: Former Smoker    Packs/day: 0.50    Years: 10.00    Pack years: 5.00    Types: Cigarettes    Quit date: 2018    Years since quitting: 4.3  . Smokeless tobacco: Never Used  Vaping Use  . Vaping Use: Never used  Substance and Sexual Activity  . Alcohol use: Yes    Alcohol/week: 0.0 standard drinks    Comment: beer - only on ocassions  . Drug use: No  . Sexual activity: Yes  Other Topics Concern  . Not on file  Social History Narrative  . Not on file   Social Determinants of Health   Financial Resource Strain: Not on file  Food Insecurity: Not on file  Transportation Needs: Not on file  Physical Activity: Not on file  Stress: Not on file  Social Connections: Not on file  Intimate Partner Violence: Not on file    Review of Systems 13 point review of systems per patient health survey noted.  Negative other than as indicated above or in HPI.    Objective:   Vitals:   09/03/20 1327  BP: 126/74  Pulse: 72  Resp: 16  Temp: 98.2 F (36.8 C)  TempSrc: Temporal  SpO2: 96%  Weight: 231 lb 12.8 oz (105.1 kg)  Height: 6\' 1"  (1.854 m)     Physical Exam Vitals reviewed.  Constitutional:      Appearance: He is well-developed.  HENT:     Head: Normocephalic and atraumatic.     Right Ear: External ear normal.     Left Ear: External ear normal.  Eyes:     Conjunctiva/sclera: Conjunctivae normal.     Pupils: Pupils are equal, round, and reactive to light.  Neck:     Thyroid: No thyromegaly.  Cardiovascular:     Rate and Rhythm: Normal rate and regular rhythm.     Heart sounds: Normal heart sounds.  Pulmonary:     Effort: Pulmonary effort is normal. No respiratory distress.     Breath sounds: Normal breath sounds. No wheezing.  Abdominal:     General: There is no  distension.     Palpations: Abdomen is soft. There is no mass.     Tenderness: There is abdominal tenderness (RUQ, neg murphy. neg McBurneys pt. ). There is no right CVA tenderness, left CVA tenderness, guarding or rebound.     Hernia: No hernia is present.  Musculoskeletal:        General: No tenderness. Normal range of motion.  Cervical back: Normal range of motion and neck supple.  Lymphadenopathy:     Cervical: No cervical adenopathy.  Skin:    General: Skin is warm and dry.  Neurological:     Mental Status: He is alert and oriented to person, place, and time.     Deep Tendon Reflexes: Reflexes are normal and symmetric.  Psychiatric:        Behavior: Behavior normal.        Assessment & Plan:  Micheal Roberts is a 50 y.o. male . Annual physical exam  - -anticipatory guidance as below in AVS, screening labs above. Health maintenance items as above in HPI discussed/recommended as applicable.   Hyperglycemia - Plan: Hemoglobin A1c  RUQ abdominal pain - Plan: CBC with Differential/Platelet  -Minimal tenderness on exam.  Prior elevated LFT.  History of IBS with chronic bloating.  Stressed importance of gastroenterology follow-up.  With some improvement on PPI, okay to continue same but check CBC, CMP, consider right upper quadrant ultrasound if not improving or abnormal labs.  ER precautions/RTC precautions given.  Elevated serum creatinine  -Maintain hydration, avoid NSAIDs, repeat labs.  Hypertriglyceridemia - Plan: Comprehensive metabolic panel, Lipid panel  -Exercise, diet for weight loss.  Recommended to decrease sodas.  Elevated LFTs - Plan: Comprehensive metabolic panel, Hepatitis C antibody  Class 1 obesity with body mass index (BMI) of 30.0 to 30.9 in adult, unspecified obesity type, unspecified whether serious comorbidity present  -Exercise, dietary guidance discussed.  Screening, anemia, deficiency, iron - Plan: CBC with Differential/Platelet  Screening for  thyroid disorder - Plan: TSH  -Family history of thyroid disease, check TSH  Screening for malignant neoplasm of prostate - Plan: PSA  Need for diphtheria-tetanus-pertussis (Tdap) vaccine - Plan: Tdap vaccine greater than or equal to 7yo IM   No orders of the defined types were placed in this encounter.  Patient Instructions   For chronic bloating and IBS symptoms, it is important that you see Dr. Ardis Hughs. Please call is office to schedule appointment.  I will check some labs for abdominal pain, ok to remain on omeprazole daily for now. Void fried/fatty foods.  If burning not improved into next week or concerns on your labs I may need to order imaging like ultrasound. Return to the clinic or go to the nearest emergency room if any of your symptoms worsen or new symptoms occur.  I do recommend getting a covid 19 vaccine booster.  Try to increase exercise (low intensity to start) to most days per week, minimum 123min per week.    Keeping you healthy  Get these tests  Blood pressure- Have your blood pressure checked once a year by your healthcare provider.  Normal blood pressure is 120/80.  Weight- Have your body mass index (BMI) calculated to screen for obesity.  BMI is a measure of body fat based on height and weight. You can also calculate your own BMI at [employer].it.  Cholesterol- Have your cholesterol checked regularly starting at age 67, sooner may be necessary if you Body mass index is 30.58 kg/m.  diabetes, high blood pressure, if a family member developed heart diseases at an early age or if you smoke.   Chlamydia, HIV, and other sexual transmitted disease- Get screened each year until the age of 58 then within three months of each new sexual partner.  Diabetes- Have your blood sugar checked regularly if you have high blood pressure, high cholesterol, a family history of diabetes or if you are overweight.  Get  these vaccines  Flu shot- Every  fall.  Tetanus shot- Every 10 years.  Menactra- Single dose; prevents meningitis.  Take these steps  Don't smoke- If you do smoke, ask your healthcare provider about quitting. For tips on how to quit, go to www.smokefree.gov or call 1-800-QUIT-NOW.  Be physically active- Exercise 5 days a week for at least 30 minutes.  If you are not already physically active start slow and gradually work up to 30 minutes of moderate physical activity.  Examples of moderate activity include walking briskly, mowing the yard, dancing, swimming bicycling, etc.  Eat a healthy diet- Eat a variety of healthy foods such as fruits, vegetables, low fat milk, low fat cheese, yogurt, lean meats, poultry, fish, beans, tofu, etc.  For more information on healthy eating, go to www.thenutritionsource.org  Drink alcohol in moderation- Limit alcohol intake two drinks or less a day.  Never drink and drive.  Dentist- Brush and floss teeth twice daily; visit your dentis twice a year.  Depression-Your emotional health is as important as your physical health.  If you're feeling down, losing interest in things you normally enjoy please talk with your healthcare provider.  Gun Safety- If you keep a gun in your home, keep it unloaded and with the safety lock on.  Bullets should be stored separately.  Helmet use- Always wear a helmet when riding a motorcycle, bicycle, rollerblading or skateboarding.  Safe sex- If you may be exposed to a sexually transmitted infection, use a condom  Seat belts- Seat bels can save your life; always wear one.  Smoke/Carbon Monoxide detectors- These detectors need to be installed on the appropriate level of your home.  Replace batteries at least once a year.  Skin Cancer- When out in the sun, cover up and use sunscreen SPF 15 or higher.  Violence- If anyone is threatening or hurting you, please tell your healthcare provider. Abdominal Pain, Adult Pain in the abdomen (abdominal pain) can be  caused by many things. Often, abdominal pain is not serious and it gets better with no treatment or by being treated at home. However, sometimes abdominal pain is serious. Your health care provider will ask questions about your medical history and do a physical exam to try to determine the cause of your abdominal pain. Follow these instructions at home: Medicines  Take over-the-counter and prescription medicines only as told by your health care provider.  Do not take a laxative unless told by your health care provider. General instructions  Watch your condition for any changes.  Drink enough fluid to keep your urine pale yellow.  Keep all follow-up visits as told by your health care provider. This is important.   Contact a health care provider if:  Your abdominal pain changes or gets worse.  You are not hungry or you lose weight without trying.  You are constipated or have diarrhea for more than 2-3 days.  You have pain when you urinate or have a bowel movement.  Your abdominal pain wakes you up at night.  Your pain gets worse with meals, after eating, or with certain foods.  You are vomiting and cannot keep anything down.  You have a fever.  You have blood in your urine. Get help right away if:  Your pain does not go away as soon as your health care provider told you to expect.  You cannot stop vomiting.  Your pain is only in areas of the abdomen, such as the right side or the left lower  portion of the abdomen. Pain on the right side could be caused by appendicitis.  You have bloody or black stools, or stools that look like tar.  You have severe pain, cramping, or bloating in your abdomen.  You have signs of dehydration, such as: ? Dark urine, very little urine, or no urine. ? Cracked lips. ? Dry mouth. ? Sunken eyes. ? Sleepiness. ? Weakness.  You have trouble breathing or chest pain. Summary  Often, abdominal pain is not serious and it gets better with no  treatment or by being treated at home. However, sometimes abdominal pain is serious.  Watch your condition for any changes.  Take over-the-counter and prescription medicines only as told by your health care provider.  Contact a health care provider if your abdominal pain changes or gets worse.  Get help right away if you have severe pain, cramping, or bloating in your abdomen. This information is not intended to replace advice given to you by your health care provider. Make sure you discuss any questions you have with your health care provider. Document Revised: 05/24/2019 Document Reviewed: 08/13/2018 Elsevier Patient Education  2021 [employer].      Signed, Merri Ray, MD [facility]

## 2020-09-03 NOTE — Patient Instructions (Addendum)
For chronic bloating and IBS symptoms, it is important that you see Dr. Ardis Hughs. Please call is office to schedule appointment.  I will check some labs for abdominal pain, ok to remain on omeprazole daily for now. Void fried/fatty foods.  If burning not improved into next week or concerns on your labs I may need to order imaging like ultrasound. Return to the clinic or go to the nearest emergency room if any of your symptoms worsen or new symptoms occur.  I do recommend getting a covid 19 vaccine booster.  Try to increase exercise (low intensity to start) to most days per week, minimum 134min per week.    Keeping you healthy  Get these tests  Blood pressure- Have your blood pressure checked once a year by your healthcare provider.  Normal blood pressure is 120/80.  Weight- Have your body mass index (BMI) calculated to screen for obesity.  BMI is a measure of body fat based on height and weight. You can also calculate your own BMI at [employer].  Cholesterol- Have your cholesterol checked regularly starting at age 69, sooner may be necessary if you Body mass index is 30.58 kg/m.  diabetes, high blood pressure, if a family member developed heart diseases at an early age or if you smoke.   Chlamydia, HIV, and other sexual transmitted disease- Get screened each year until the age of 90 then within three months of each new sexual partner.  Diabetes- Have your blood sugar checked regularly if you have high blood pressure, high cholesterol, a family history of diabetes or if you are overweight.  Get these vaccines  Flu shot- Every fall.  Tetanus shot- Every 10 years.  Menactra- Single dose; prevents meningitis.  Take these steps  Don't smoke- If you do smoke, ask your healthcare provider about quitting. For tips on how to quit, go to www.smokefree.gov or call 1-800-QUIT-NOW.  Be physically active- Exercise 5 days a week for at least 30 minutes.  If you are not already  physically active start slow and gradually work up to 30 minutes of moderate physical activity.  Examples of moderate activity include walking briskly, mowing the yard, dancing, swimming bicycling, etc.  Eat a healthy diet- Eat a variety of healthy foods such as fruits, vegetables, low fat milk, low fat cheese, yogurt, lean meats, poultry, fish, beans, tofu, etc.  For more information on healthy eating, go to www.thenutritionsource.org  Drink alcohol in moderation- Limit alcohol intake two drinks or less a day.  Never drink and drive.  Dentist- Brush and floss teeth twice daily; visit your dentis twice a year.  Depression-Your emotional health is as important as your physical health.  If you're feeling down, losing interest in things you normally enjoy please talk with your healthcare provider.  Gun Safety- If you keep a gun in your home, keep it unloaded and with the safety lock on.  Bullets should be stored separately.  Helmet use- Always wear a helmet when riding a motorcycle, bicycle, rollerblading or skateboarding.  Safe sex- If you may be exposed to a sexually transmitted infection, use a condom  Seat belts- Seat bels can save your life; always wear one.  Smoke/Carbon Monoxide detectors- These detectors need to be installed on the appropriate level of your home.  Replace batteries at least once a year.  Skin Cancer- When out in the sun, cover up and use sunscreen SPF 15 or higher.  Violence- If anyone is threatening or hurting you, please tell your healthcare provider.  Abdominal Pain, Adult Pain in the abdomen (abdominal pain) can be caused by many things. Often, abdominal pain is not serious and it gets better with no treatment or by being treated at home. However, sometimes abdominal pain is serious. Your health care provider will ask questions about your medical history and do a physical exam to try to determine the cause of your abdominal pain. Follow these instructions at  home: Medicines  Take over-the-counter and prescription medicines only as told by your health care provider.  Do not take a laxative unless told by your health care provider. General instructions  Watch your condition for any changes.  Drink enough fluid to keep your urine pale yellow.  Keep all follow-up visits as told by your health care provider. This is important.   Contact a health care provider if:  Your abdominal pain changes or gets worse.  You are not hungry or you lose weight without trying.  You are constipated or have diarrhea for more than 2-3 days.  You have pain when you urinate or have a bowel movement.  Your abdominal pain wakes you up at night.  Your pain gets worse with meals, after eating, or with certain foods.  You are vomiting and cannot keep anything down.  You have a fever.  You have blood in your urine. Get help right away if:  Your pain does not go away as soon as your health care provider told you to expect.  You cannot stop vomiting.  Your pain is only in areas of the abdomen, such as the right side or the left lower portion of the abdomen. Pain on the right side could be caused by appendicitis.  You have bloody or black stools, or stools that look like tar.  You have severe pain, cramping, or bloating in your abdomen.  You have signs of dehydration, such as: ? Dark urine, very little urine, or no urine. ? Cracked lips. ? Dry mouth. ? Sunken eyes. ? Sleepiness. ? Weakness.  You have trouble breathing or chest pain. Summary  Often, abdominal pain is not serious and it gets better with no treatment or by being treated at home. However, sometimes abdominal pain is serious.  Watch your condition for any changes.  Take over-the-counter and prescription medicines only as told by your health care provider.  Contact a health care provider if your abdominal pain changes or gets worse.  Get help right away if you have severe pain,  cramping, or bloating in your abdomen. This information is not intended to replace advice given to you by your health care provider. Make sure you discuss any questions you have with your health care provider. Document Revised: 05/24/2019 Document Reviewed: 08/13/2018 Elsevier Patient Education  [place].

## 2020-09-04 LAB — HEPATITIS C ANTIBODY
Hepatitis C Ab: NONREACTIVE
SIGNAL TO CUT-OFF: 0.03 (ref <1.00)

## 2021-03-18 ENCOUNTER — Ambulatory Visit: Payer: BC Managed Care – PPO | Admitting: Family Medicine

## 2021-03-18 ENCOUNTER — Encounter: Payer: Self-pay | Admitting: Family Medicine

## 2021-03-18 VITALS — BP 128/80 | HR 74 | Temp 98.1°F | Resp 16 | Ht 73.0 in | Wt 233.8 lb

## 2021-03-18 DIAGNOSIS — Z23 Encounter for immunization: Secondary | ICD-10-CM | POA: Diagnosis not present

## 2021-03-18 DIAGNOSIS — M2550 Pain in unspecified joint: Secondary | ICD-10-CM

## 2021-03-18 DIAGNOSIS — K582 Mixed irritable bowel syndrome: Secondary | ICD-10-CM

## 2021-03-18 DIAGNOSIS — M791 Myalgia, unspecified site: Secondary | ICD-10-CM

## 2021-03-18 DIAGNOSIS — R21 Rash and other nonspecific skin eruption: Secondary | ICD-10-CM

## 2021-03-18 DIAGNOSIS — R1013 Epigastric pain: Secondary | ICD-10-CM | POA: Diagnosis not present

## 2021-03-18 DIAGNOSIS — M25512 Pain in left shoulder: Secondary | ICD-10-CM

## 2021-03-18 DIAGNOSIS — L299 Pruritus, unspecified: Secondary | ICD-10-CM

## 2021-03-18 DIAGNOSIS — R1031 Right lower quadrant pain: Secondary | ICD-10-CM | POA: Diagnosis not present

## 2021-03-18 DIAGNOSIS — R438 Other disturbances of smell and taste: Secondary | ICD-10-CM

## 2021-03-18 DIAGNOSIS — F418 Other specified anxiety disorders: Secondary | ICD-10-CM

## 2021-03-18 LAB — CBC
HCT: 40.5 % (ref 39.0–52.0)
Hemoglobin: 13.5 g/dL (ref 13.0–17.0)
MCHC: 33.3 g/dL (ref 30.0–36.0)
MCV: 88.4 fl (ref 78.0–100.0)
Platelets: 241 10*3/uL (ref 150.0–400.0)
RBC: 4.58 Mil/uL (ref 4.22–5.81)
RDW: 13.3 % (ref 11.5–15.5)
WBC: 5.7 10*3/uL (ref 4.0–10.5)

## 2021-03-18 LAB — COMPREHENSIVE METABOLIC PANEL
ALT: 45 U/L (ref 0–53)
AST: 28 U/L (ref 0–37)
Albumin: 4.6 g/dL (ref 3.5–5.2)
Alkaline Phosphatase: 63 U/L (ref 39–117)
BUN: 14 mg/dL (ref 6–23)
CO2: 31 mEq/L (ref 19–32)
Calcium: 9.3 mg/dL (ref 8.4–10.5)
Chloride: 102 mEq/L (ref 96–112)
Creatinine, Ser: 1.38 mg/dL (ref 0.40–1.50)
GFR: 59.64 mL/min — ABNORMAL LOW (ref >60.00)
Glucose, Bld: 96 mg/dL (ref 70–99)
Potassium: 3.6 mEq/L (ref 3.5–5.1)
Sodium: 140 mEq/L (ref 135–145)
Total Bilirubin: 0.7 mg/dL (ref 0.2–1.2)
Total Protein: 7.6 g/dL (ref 6.0–8.3)

## 2021-03-18 LAB — LIPASE: Lipase: 29 U/L (ref 11.0–59.0)

## 2021-03-18 LAB — CK: Total CK: 332 U/L — ABNORMAL HIGH (ref 7–232)

## 2021-03-18 MED ORDER — HYOSCYAMINE SULFATE 0.125 MG PO TABS: 0.1250 mg | ORAL_TABLET | Freq: Four times a day (QID) | ORAL | 0 refills | Status: DC | PRN

## 2021-03-18 MED ORDER — HYDROXYZINE HCL 10 MG PO TABS: 10.0000 mg | ORAL_TABLET | Freq: Three times a day (TID) | ORAL | 0 refills | Status: DC | PRN

## 2021-03-18 NOTE — Patient Instructions (Signed)
See information below on constipation.  Fiber supplement such as Metamucil or Citrucel may help.  MiraLAX if needed for persistent constipation.  If bowels are moving normally and you still have abdominal cramping can try low-dose hyoscamine if needed for IBS symptoms.  Hydroxyzine if needed for times of anxiety but I would like to discuss this further at her follow-up visits.  We may need to consider returning to previous medications.  I will check muscle enzyme test but joint/muscle pains may be due to physical work.  Please have x-ray performed at [facility] location.  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   Recheck next 2 to 3 weeks, sooner if needed.  Return to the clinic or go to the nearest emergency room if any of your symptoms worsen or new symptoms occur.

## 2021-03-18 NOTE — Progress Notes (Signed)
Subjective:  Patient ID: Micheal Roberts, male    DOB: 1970/10/13  Age: 50 y.o. MRN: [id number]  CC:  Chief Complaint  Patient presents with   Pain    Pt reports aches and pains over the last few months, causing weakness in his arms at times, feels it is his joints    Abdominal Pain    Pt reports stomach pain, sharp, intermittent, ongoing last 2-3 months    post COVID question    Pt has lack of scent, can smell some things but not all, reports taste has returned to normal post COVID but smell has not     HPI Micheal Roberts presents for   Arthralgias, myalgias: Both shoulders, both forearms, left knee - comes and goes. Been there some time, but more frequent. Played sports, but no surgery/fractures.  Notices at various times. Sometimes mornings. Able to lift, muscles feel fatigued at times. No focal weakness. Pins/needles in areas above at time. Moves around.  no joint swelling. Sometimes itchy red rash on forearms, chest, or R side. Resolves on own. Small hives - possibly when more anxious  Always feels anxious. Prozac in past.  No SI/HI.  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 No change in work/activity.  Tx: ibuprofen  - few times per week, not daily. Helps symptoms.  No statins.  No herbal supplements. One a day MVI.  BM daily, Hx of IBS alternates from constipation, diarrhea. More constipation.   Abdominal Pain: Discussed right upper quadrant abdominal pain with previous elevated LFTs at his May visit.  History of IBS with some chronic bloating.  Recommended gastroenterology follow-up at that time.  We continued him on PPI.  Hep C antibody was negative/nonreactive, LFTs had normalized, CBC was normal. Has not seen gastroenterology. Reports recent pain intermittent, sharp, ongoing the last few months.  Same pain as in May. Same intensity, comes and goes. Better with eating.  Some heartburn at times, taking omeprazole daily.  Alcohol: 2 drinks per month. No tobacco.   Had death in family - stepson passed, was not able to see GI. Appt 04/28/21.  No melena/hematochezia. Some nausea, no vomiting, no loss of appetite. No weight loss, night sweats or fever.  R lower abdomen is area of soreness.   Decreased smell, taste: Covid infection in 2021. - diagnosed in 12/2019. Urgent care. Reports positive test at Urgent Care.  Taste is normal, but some continued difficulty with smell.   History Patient Active Problem List   Diagnosis Date Noted   IBS (irritable bowel syndrome)    Past Medical History:  Diagnosis Date   Anxiety    IBS (irritable bowel syndrome)    Past Surgical History:  Procedure Laterality Date   left ACL arthroscopy     Allergies  Allergen Reactions   Benzonatate Anaphylaxis   Prior to Admission medications   Medication Sig Start Date End Date Taking? Authorizing Provider  omeprazole (PRILOSEC) 20 MG capsule Take 20 mg by mouth daily.   Yes [provider]   Social History   Socioeconomic History   Marital status: Married    Spouse name: Not on file   Number of children: Not on file   Years of education: Not on file   Highest education level: Not on file  Occupational History   Not on file  Tobacco Use   Smoking status: Former    Packs/day: 0.50    Years: 10.00    Pack years: 5.00  Types: Cigarettes    Quit date: 2018    Years since quitting: 4.9   Smokeless tobacco: Never  Vaping Use   Vaping Use: Never used  Substance and Sexual Activity   Alcohol use: Yes    Alcohol/week: 0.0 standard drinks    Comment: beer - only on ocassions   Drug use: No   Sexual activity: Yes  Other Topics Concern   Not on file  Social History Narrative   Not on file   Social Determinants of Health   Financial Resource Strain: Not on file  Food Insecurity: Not on file  Transportation Needs: Not on file  Physical Activity: Not on file  Stress: Not on file  Social Connections: Not on file  Intimate Partner Violence: Not  on file    Review of Systems Per HPI.   Objective:   Vitals:   03/18/21 1350  BP: 128/80  Pulse: 74  Resp: 16  Temp: 98.1 F (36.7 C)  TempSrc: Temporal  SpO2: 98%  Weight: 233 lb 12.8 oz (106.1 kg)  Height: 6\' 1"  (1.854 m)     Physical Exam Vitals reviewed.  Constitutional:      Appearance: He is well-developed.  HENT:     Head: Normocephalic and atraumatic.  Neck:     Vascular: No carotid bruit or JVD.  Cardiovascular:     Rate and Rhythm: Normal rate and regular rhythm.     Heart sounds: Normal heart sounds. No murmur heard. Pulmonary:     Effort: Pulmonary effort is normal.     Breath sounds: Normal breath sounds. No rales.  Abdominal:     General: There is no distension.     Palpations: There is no mass.     Tenderness: There is no abdominal tenderness. There is no guarding or rebound.  Musculoskeletal:     Right lower leg: No edema.     Left lower leg: No edema.     Comments: Left shoulder, right shoulder full range of motion, full rotator cuff strength, no focal bony tenderness.  Left AC slight discomfort, reproduces some of his soreness, negative Neer, Hawkins, empty can bilaterally.  Positive crossover on left.  Bilateral forearms with slight discomfort at the dorsal forearm but intact strength, grip strength, range of motion at elbow, wrist, hands.  Left knee full range of motion, no bony tenderness.  Skin:    General: Skin is warm and dry.     Findings: No rash.  Neurological:     Mental Status: He is alert and oriented to person, place, and time.  Psychiatric:        Mood and Affect: Mood normal.     44 minutes spent during visit, including chart review, discussion if additional concerns, counseling and assimilation of information, exam, discussion of plan, and chart completion.    Assessment & Plan:  Micheal Roberts is a 50 y.o. male . Irritable bowel syndrome with both constipation and diarrhea - Plan: hyoscyamine (LEVSIN) 0.125 MG  tablet RLQ abdominal pain Epigastric abdominal pain - Plan: CBC, Comprehensive metabolic panel  -Various areas of discomfort.  Intermittent sharp pains could be related to IBS, constipation.  Epigastric pain possibly gastritis versus GERD flare versus IBS.  Nausea without vomiting.  Less likely pancreatitis.  -Continue omeprazole daily, add high-dose amine as needed for IBS symptoms/cramping.  -Constipation prevention/treatment discussed.  -Check lipase, CBC, CMP with previous LFTs that had improved.  -Plan to follow-up with gastroenterology, depending on symptoms with above treatment may need  imaging or follow-up sooner.  ER/RTC precautions.  Need for influenza vaccination - Plan: Flu Vaccine QUAD 6+ mos PF IM (Fluarix Quad PF)  Need for shingles vaccine - Plan: Varicella-zoster vaccine IM Itching - Plan: hydrOXYzine (ATARAX) 10 MG tablet  Situational anxiety - Plan: hydrOXYzine (ATARAX) 10 MG tablet  -Prior daily medication for anxiety.  Declines at this time.  Certainly could be impacting some of his other symptoms above.  Provided hydroxyzine temporarily if needed for flares and anxiety.  Can also be used if hives based on his description of rash, although reports those resolve quickly.  Arthralgia, unspecified joint - Plan: CK Left shoulder pain, unspecified chronicity - Plan: DG Shoulder Left Myalgia - Plan: CK  -Possible overuse/tendinitis of forearms but will check CPK.  Unlikely rhabdo.  Other areas of arthralgia possible overuse/arthritis.  Left shoulder pain appears to be at his acromioclavicular joint, possible arthritis.  Check imaging.  Hold on new meds for now.  Rash and nonspecific skin eruption  -No rash present.  Advised to take photo when that occurs.  Hydroxyzine as above if needed for urticaria based on description.  Decreased sense of smell - Plan: Ambulatory referral to ENT  -I am unable to see exact date of previous COVID infection or testing, but reports decreased  sense of smell since COVID infection late last year.  With persistent symptoms will refer to ENT to discuss further.  Meds ordered this encounter  Medications   hydrOXYzine (ATARAX) 10 MG tablet    Sig: Take 1 tablet (10 mg total) by mouth 3 (three) times daily as needed.    Dispense:  30 tablet    Refill:  0   hyoscyamine (LEVSIN) 0.125 MG tablet    Sig: Take 1 tablet (0.125 mg total) by mouth every 6 (six) hours as needed for cramping.    Dispense:  30 tablet    Refill:  0   Patient Instructions  See information below on constipation.  Fiber supplement such as Metamucil or Citrucel may help.  MiraLAX if needed for persistent constipation.  If bowels are moving normally and you still have abdominal cramping can try low-dose hyoscamine if needed for IBS symptoms.  Hydroxyzine if needed for times of anxiety but I would like to discuss this further at her follow-up visits.  We may need to consider returning to previous medications.  I will check muscle enzyme test but joint/muscle pains may be due to physical work.  Please have x-ray performed at [facility] location.  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   Recheck next 2 to 3 weeks, sooner if needed.  Return to the clinic or go to the nearest emergency room if any of your symptoms worsen or new symptoms occur.    Signed,   Merri Ray, MD [place] 03/18/21 2:43 PM

## 2021-04-08 ENCOUNTER — Other Ambulatory Visit: Payer: Self-pay

## 2021-04-08 ENCOUNTER — Encounter: Payer: Self-pay | Admitting: Family Medicine

## 2021-04-08 ENCOUNTER — Ambulatory Visit: Payer: BC Managed Care – PPO | Admitting: Family Medicine

## 2021-04-08 VITALS — BP 133/94 | HR 60 | Temp 98.1°F | Resp 18 | Ht 75.0 in | Wt 238.2 lb

## 2021-04-08 DIAGNOSIS — R400 Somnolence: Secondary | ICD-10-CM | POA: Diagnosis not present

## 2021-04-08 DIAGNOSIS — R0683 Snoring: Secondary | ICD-10-CM

## 2021-04-08 DIAGNOSIS — G4700 Insomnia, unspecified: Secondary | ICD-10-CM | POA: Diagnosis not present

## 2021-04-08 DIAGNOSIS — R748 Abnormal levels of other serum enzymes: Secondary | ICD-10-CM

## 2021-04-08 DIAGNOSIS — M791 Myalgia, unspecified site: Secondary | ICD-10-CM

## 2021-04-08 DIAGNOSIS — M2550 Pain in unspecified joint: Secondary | ICD-10-CM

## 2021-04-08 NOTE — Progress Notes (Signed)
Subjective:  Patient ID: Micheal Roberts, male    DOB: [birth year]  Age: 50 y.o. MRN: [id number]  CC:  Chief Complaint  Patient presents with   Follow-up    Patient states he is here for a follow up from last visit and having problems sleeping and the sleep aid he is taking is helping but want to discuss.    HPI Micheal Roberts presents for  Follow-up.  Multiple conditions discussed on December 1.  Left shoulder pain, forearm pain.   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  Minimally elevated CPK at 332.  Activity modification discussed last visit.  He is not on statin.  Plan for imaging of left shoulder.  Has not had performed at this time. Busy.  Soreness comes and goes. Has made some adjustments in activity forearm, R shoulder fatigued fo++r a few days, then resolves, recurs.   Insomnia: Situational anxiety discussed sleep.  Hydroxyzine 10 mg provided last visit if needed to help with sleep/anxiety.  Also had reported possible hives, option to use hydroxyzine for that rash as well.  Previously had taken SSRI daily.  Prozac 20 mg.  Remote history of Klonopin. No recurrence of rash.  Only took hydroxyzine once. Only sleeping 4 hours at times.  Wakes up early and trouble getting back to sleep. Snoring, some pauses noted by wife - tired during the day. Sometimes trouble getting to sleep. Denies anxiety. Otc tylenol PM once per night. Helps to fall asleep.   Results for orders placed or performed in visit on 03/18/21  CBC  Result Value Ref Range   WBC 5.7 4.0 - 10.5 K/uL   RBC 4.58 4.22 - 5.81 Mil/uL   Platelets 241.0 150.0 - 400.0 K/uL   Hemoglobin 13.5 13.0 - 17.0 g/dL   HCT 40.5 39.0 - 52.0 %   MCV 88.4 78.0 - 100.0 fl   MCHC 33.3 30.0 - 36.0 g/dL   RDW 13.3 11.5 - 15.5 %  Comprehensive metabolic panel  Result Value Ref Range   Sodium 140 135 - 145 mEq/L   Potassium 3.6 3.5 - 5.1 mEq/L   Chloride 102 96 - 112 mEq/L   CO2 31 19 - 32 mEq/L   Glucose, Bld 96 70 - 99  mg/dL   BUN 14 6 - 23 mg/dL   Creatinine, Ser 1.38 0.40 - 1.50 mg/dL   Total Bilirubin 0.7 0.2 - 1.2 mg/dL   Alkaline Phosphatase 63 39 - 117 U/L   AST 28 0 - 37 U/L   ALT 45 0 - 53 U/L   Total Protein 7.6 6.0 - 8.3 g/dL   Albumin 4.6 3.5 - 5.2 g/dL   GFR 59.64 (L) >60.00 mL/min   Calcium 9.3 8.4 - 10.5 mg/dL  Lipase  Result Value Ref Range   Lipase 29.0 11.0 - 59.0 U/L  CK  Result Value Ref Range   Total CK 332 (H) 7 - 232 U/L     History Patient Active Problem List   Diagnosis Date Noted   IBS (irritable bowel syndrome)    Past Medical History:  Diagnosis Date   Anxiety    IBS (irritable bowel syndrome)    Past Surgical History:  Procedure Laterality Date   left ACL arthroscopy     Allergies  Allergen Reactions   Benzonatate Anaphylaxis   Prior to Admission medications   Medication Sig Start Date End Date Taking? Authorizing Provider  hydrOXYzine (ATARAX) 10 MG tablet Take 1 tablet (10 mg total) by mouth  3 (three) times daily as needed. 03/18/21  Yes Wendie Agreste, MD  hyoscyamine (LEVSIN) 0.125 MG tablet Take 1 tablet (0.125 mg total) by mouth every 6 (six) hours as needed for cramping. 03/18/21  Yes Wendie Agreste, MD  omeprazole (PRILOSEC) 20 MG capsule Take 20 mg by mouth daily.   Yes [provider]   Social History   Socioeconomic History   Marital status: Married    Spouse name: Not on file   Number of children: Not on file   Years of education: Not on file   Highest education level: Not on file  Occupational History   Not on file  Tobacco Use   Smoking status: Former    Packs/day: 0.50    Years: 10.00    Pack years: 5.00    Types: Cigarettes    Quit date: 2018    Years since quitting: 4.9   Smokeless tobacco: Never  Vaping Use   Vaping Use: Never used  Substance and Sexual Activity   Alcohol use: Yes    Alcohol/week: 0.0 standard drinks    Comment: beer - only on ocassions   Drug use: No   Sexual activity: Yes  Other  Topics Concern   Not on file  Social History Narrative   Not on file   Social Determinants of Health   Financial Resource Strain: Not on file  Food Insecurity: Not on file  Transportation Needs: Not on file  Physical Activity: Not on file  Stress: Not on file  Social Connections: Not on file  Intimate Partner Violence: Not on file    Review of Systems   Objective:   Vitals:   04/08/21 0957  BP: (!) 133/94  Pulse: 60  Resp: 18  Temp: 98.1 F (36.7 C)  TempSrc: Temporal  SpO2: 99%  Weight: 238 lb 3.2 oz (108 kg)  Height: 6\' 3"  (1.905 m)     Physical Exam Constitutional:      General: He is not in acute distress.    Appearance: Normal appearance. He is well-developed.  HENT:     Head: Normocephalic and atraumatic.  Cardiovascular:     Rate and Rhythm: Normal rate.  Pulmonary:     Effort: Pulmonary effort is normal.  Musculoskeletal:     Comments: Full range of motion of right shoulder, elbow, wrist.  Muscle soft forearm nontender.  Minimal discomfort with range of motion of right shoulder but full and full rotator cuff strength.  Negative drop arm.  Negative crossover.  AC nontender.  Neurological:     Mental Status: He is alert and oriented to person, place, and time.  Psychiatric:        Mood and Affect: Mood normal.       Assessment & Plan:  Micheal Roberts is a 50 y.o. male . Elevated CPK - Plan: CK Arthralgia, unspecified joint Myalgia  -Improved, intermittent.  Repeat CPK has previous borderline elevation, continue activity modification.  Plans on obtaining x-ray ordered last visit.  Consider Ortho eval if persistent shoulder/arm arthralgia/myalgia.  RTC precautions.  Daytime somnolence - Plan: Ambulatory referral to Sleep Studies Snoring - Plan: Ambulatory referral to Sleep Studies Insomnia, unspecified type - Plan: Ambulatory referral to Sleep Studies  -Denies anxiety.  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  Low-dose hydroxyzine or Tylenol PM over-the-counter for difficulty with sleep onset but will refer to sleep specialist.  Advised on  risks of untreated sleep apnea and to not drive or operate heavy machinery if he is fatigued or sleepy.  No orders of the defined types were placed in this encounter.  Patient Instructions   I will recheck muscle test, please have xray performed. If continued soreness in shoulder/arm I recommend follow up with orthopaedics - let me know and I can refer you.   I will refer you for sleep apnea testing, do not drive or operate machinery if you are sleepy. Hydroxyzine or tylenol PM only if difficulty getting to sleep. Return to the clinic or go to the nearest emergency room if any of your symptoms worsen or new symptoms occur.    Insomnia Insomnia is a sleep disorder that makes it difficult to fall asleep or stay asleep. Insomnia can cause fatigue, low energy, difficulty concentrating, mood swings, and poor performance at work or school. There are three different ways to classify insomnia: Difficulty falling asleep. Difficulty staying asleep. Waking up too early in the morning. Any type of insomnia can be long-term (chronic) or short-term (acute). Both are common. Short-term insomnia usually lasts for three months or less. Chronic insomnia occurs at least three times a week for longer than three months. What are the causes? Insomnia may be caused by another condition, situation, or substance, such as: Anxiety. Certain medicines. Gastroesophageal reflux disease (GERD) or other gastrointestinal conditions. Asthma or other breathing conditions. Restless legs syndrome, sleep apnea, or other sleep disorders. Chronic pain. Menopause. Stroke. Abuse of alcohol, tobacco, or illegal drugs. Mental health conditions, such as depression. Caffeine. Neurological disorders, such as  Alzheimer's disease. An overactive thyroid (hyperthyroidism). Sometimes, the cause of insomnia may not be known. What increases the risk? Risk factors for insomnia include: Gender. Women are affected more often than men. Age. Insomnia is more common as you get older. Stress. Lack of exercise. Irregular work schedule or working night shifts. Traveling between different time zones. Certain medical and mental health conditions. What are the signs or symptoms? If you have insomnia, the main symptom is having trouble falling asleep or having trouble staying asleep. This may lead to other symptoms, such as: Feeling fatigued or having low energy. Feeling nervous about going to sleep. Not feeling rested in the morning. Having trouble concentrating. Feeling irritable, anxious, or depressed. How is this diagnosed? This condition may be diagnosed based on: Your symptoms and medical history. Your health care provider may ask about: Your sleep habits. Any medical conditions you have. Your mental health. A physical exam. How is this treated? Treatment for insomnia depends on the cause. Treatment may focus on treating an underlying condition that is causing insomnia. Treatment may also include: Medicines to help you sleep. Counseling or therapy. Lifestyle adjustments to help you sleep better. Follow these instructions at home: Eating and drinking  Limit or avoid alcohol, caffeinated beverages, and cigarettes, especially close to bedtime. These can disrupt your sleep. Do not eat a large meal or eat spicy foods right before bedtime. This can lead to digestive discomfort that can make it hard for you to sleep. Sleep habits  Keep a sleep diary to help you and your health care provider figure out what could be causing your insomnia. Write down: When you sleep. When you wake up during the night. How well you sleep. How rested you feel the next day. Any side effects of medicines you are  taking. What you eat and drink. Make your bedroom a dark, comfortable place where it is easy to fall  asleep. Put up shades or blackout curtains to block light from outside. Use a white noise machine to block noise. Keep the temperature cool. Limit screen use before bedtime. This includes: Watching TV. Using your smartphone, tablet, or computer. Stick to a routine that includes going to bed and waking up at the same times every day and night. This can help you fall asleep faster. Consider making a quiet activity, such as reading, part of your nighttime routine. Try to avoid taking naps during the day so that you sleep better at night. Get out of bed if you are still awake after 15 minutes of trying to sleep. Keep the lights down, but try reading or doing a quiet activity. When you feel sleepy, go back to bed. General instructions Take over-the-counter and prescription medicines only as told by your health care provider. Exercise regularly, as told by your health care provider. Avoid exercise starting several hours before bedtime. Use relaxation techniques to manage stress. Ask your health care provider to suggest some techniques that may work well for you. These may include: Breathing exercises. Routines to release muscle tension. Visualizing peaceful scenes. Make sure that you drive carefully. Avoid driving if you feel very sleepy. Keep all follow-up visits as told by your health care provider. This is important. Contact a health care provider if: You are tired throughout the day. You have trouble in your daily routine due to sleepiness. You continue to have sleep problems, or your sleep problems get worse. Get help right away if: You have serious thoughts about hurting yourself or someone else. If you ever feel like you may hurt yourself or others, or have thoughts about taking your own life, get help right away. You can go to your nearest emergency department or call: Your local  emergency services (911 in the [place] A suicide crisis helpline, such as the [place] at 337-717-6899 or 988 in the [place]. This is open 24 hours a day. Summary Insomnia is a sleep disorder that makes it difficult to fall asleep or stay asleep. Insomnia can be long-term (chronic) or short-term (acute). Treatment for insomnia depends on the cause. Treatment may focus on treating an underlying condition that is causing insomnia. Keep a sleep diary to help you and your health care provider figure out what could be causing your insomnia. This information is not intended to replace advice given to you by your health care provider. Make sure you discuss any questions you have with your health care provider. Document Revised: 10/28/2020 Document Reviewed: 02/13/2020 Elsevier Patient Education  2022 [employer].     If you have lab work done today you will be contacted with your lab results within the next 2 weeks.  If you have not heard from [place] then please contact us. The fastest way to get your results is to register for My Chart.   IF you received an x-ray today, you will receive an invoice from [facility]. Please contact [facility] at [phone] with questions or concerns regarding your invoice.   IF you received labwork today, you will receive an invoice from [facility]. Please contact [facility] at [phone] with questions or concerns regarding your invoice.   Our billing staff will not be able to assist you with questions regarding bills from these companies.  You will be contacted with the lab results as soon as they are available. The fastest way to get your results is to activate your My Chart account. Instructions are located on the  last page of this paperwork. If you have not heard from [place] regarding the results in 2 weeks, please contact this office.        Signed,   Merri Ray, MD [place] 04/08/21 10:52 AM

## 2021-04-08 NOTE — Patient Instructions (Addendum)
I will recheck muscle test, please have xray performed. If continued soreness in shoulder/arm I recommend follow up with orthopaedics - let me know and I can refer you.   I will refer you for sleep apnea testing, do not drive or operate machinery if you are sleepy. Hydroxyzine or tylenol PM only if difficulty getting to sleep. Return to the clinic or go to the nearest emergency room if any of your symptoms worsen or new symptoms occur.    Insomnia Insomnia is a sleep disorder that makes it difficult to fall asleep or stay asleep. Insomnia can cause fatigue, low energy, difficulty concentrating, mood swings, and poor performance at work or school. There are three different ways to classify insomnia: Difficulty falling asleep. Difficulty staying asleep. Waking up too early in the morning. Any type of insomnia can be long-term (chronic) or short-term (acute). Both are common. Short-term insomnia usually lasts for three months or less. Chronic insomnia occurs at least three times a week for longer than three months. What are the causes? Insomnia may be caused by another condition, situation, or substance, such as: Anxiety. Certain medicines. Gastroesophageal reflux disease (GERD) or other gastrointestinal conditions. Asthma or other breathing conditions. Restless legs syndrome, sleep apnea, or other sleep disorders. Chronic pain. Menopause. Stroke. Abuse of alcohol, tobacco, or illegal drugs. Mental health conditions, such as depression. Caffeine. Neurological disorders, such as Alzheimer's disease. An overactive thyroid (hyperthyroidism). Sometimes, the cause of insomnia may not be known. What increases the risk? Risk factors for insomnia include: Gender. Women are affected more often than men. Age. Insomnia is more common as you get older. Stress. Lack of exercise. Irregular work schedule or working night shifts. Traveling between different time zones. Certain medical and mental  health conditions. What are the signs or symptoms? If you have insomnia, the main symptom is having trouble falling asleep or having trouble staying asleep. This may lead to other symptoms, such as: Feeling fatigued or having low energy. Feeling nervous about going to sleep. Not feeling rested in the morning. Having trouble concentrating. Feeling irritable, anxious, or depressed. How is this diagnosed? This condition may be diagnosed based on: Your symptoms and medical history. Your health care provider may ask about: Your sleep habits. Any medical conditions you have. Your mental health. A physical exam. How is this treated? Treatment for insomnia depends on the cause. Treatment may focus on treating an underlying condition that is causing insomnia. Treatment may also include: Medicines to help you sleep. Counseling or therapy. Lifestyle adjustments to help you sleep better. Follow these instructions at home: Eating and drinking  Limit or avoid alcohol, caffeinated beverages, and cigarettes, especially close to bedtime. These can disrupt your sleep. Do not eat a large meal or eat spicy foods right before bedtime. This can lead to digestive discomfort that can make it hard for you to sleep. Sleep habits  Keep a sleep diary to help you and your health care provider figure out what could be causing your insomnia. Write down: When you sleep. When you wake up during the night. How well you sleep. How rested you feel the next day. Any side effects of medicines you are taking. What you eat and drink. Make your bedroom a dark, comfortable place where it is easy to fall asleep. Put up shades or blackout curtains to block light from outside. Use a white noise machine to block noise. Keep the temperature cool. Limit screen use before bedtime. This includes: Watching TV. Using your smartphone, tablet,  or computer. Stick to a routine that includes going to bed and waking up at the same  times every day and night. This can help you fall asleep faster. Consider making a quiet activity, such as reading, part of your nighttime routine. Try to avoid taking naps during the day so that you sleep better at night. Get out of bed if you are still awake after 15 minutes of trying to sleep. Keep the lights down, but try reading or doing a quiet activity. When you feel sleepy, go back to bed. General instructions Take over-the-counter and prescription medicines only as told by your health care provider. Exercise regularly, as told by your health care provider. Avoid exercise starting several hours before bedtime. Use relaxation techniques to manage stress. Ask your health care provider to suggest some techniques that may work well for you. These may include: Breathing exercises. Routines to release muscle tension. Visualizing peaceful scenes. Make sure that you drive carefully. Avoid driving if you feel very sleepy. Keep all follow-up visits as told by your health care provider. This is important. Contact a health care provider if: You are tired throughout the day. You have trouble in your daily routine due to sleepiness. You continue to have sleep problems, or your sleep problems get worse. Get help right away if: You have serious thoughts about hurting yourself or someone else. If you ever feel like you may hurt yourself or others, or have thoughts about taking your own life, get help right away. You can go to your nearest emergency department or call: Your local emergency services (911 in the [place] A suicide crisis helpline, such as the [place] at 620-290-7681 or 988 in the [place]. This is open 24 hours a day. Summary Insomnia is a sleep disorder that makes it difficult to fall asleep or stay asleep. Insomnia can be long-term (chronic) or short-term (acute). Treatment for insomnia depends on the cause. Treatment may focus on treating an underlying  condition that is causing insomnia. Keep a sleep diary to help you and your health care provider figure out what could be causing your insomnia. This information is not intended to replace advice given to you by your health care provider. Make sure you discuss any questions you have with your health care provider. Document Revised: 10/28/2020 Document Reviewed: 02/13/2020 Elsevier Patient Education  2022 [employer].     If you have lab work done today you will be contacted with your lab results within the next 2 weeks.  If you have not heard from [place] then please contact us. The fastest way to get your results is to register for My Chart.   IF you received an x-ray today, you will receive an invoice from [facility]. Please contact [facility] at [phone] with questions or concerns regarding your invoice.   IF you received labwork today, you will receive an invoice from [place]. Please contact [facility] at [phone] with questions or concerns regarding your invoice.   Our billing staff will not be able to assist you with questions regarding bills from these companies.  You will be contacted with the lab results as soon as they are available. The fastest way to get your results is to activate your My Chart account. Instructions are located on the last page of this paperwork. If you have not heard from [place] regarding the results in 2 weeks, please contact this office.

## 2021-04-28 ENCOUNTER — Encounter: Payer: Self-pay | Admitting: Gastroenterology

## 2021-04-28 ENCOUNTER — Ambulatory Visit: Payer: BC Managed Care – PPO | Admitting: Gastroenterology

## 2021-04-28 ENCOUNTER — Ambulatory Visit: Payer: BC Managed Care – PPO | Admitting: Family Medicine

## 2021-04-28 DIAGNOSIS — R1013 Epigastric pain: Secondary | ICD-10-CM | POA: Diagnosis not present

## 2021-04-28 DIAGNOSIS — R194 Change in bowel habit: Secondary | ICD-10-CM | POA: Diagnosis not present

## 2021-04-28 DIAGNOSIS — K219 Gastro-esophageal reflux disease without esophagitis: Secondary | ICD-10-CM

## 2021-04-28 MED ORDER — CITRUCEL PO POWD: 1.0000 | Freq: Every day | ORAL | Status: DC

## 2021-04-28 MED ORDER — OMEPRAZOLE 40 MG PO CPDR: 40.0000 mg | DELAYED_RELEASE_CAPSULE | Freq: Every day | ORAL | 3 refills | Status: DC

## 2021-04-28 NOTE — Progress Notes (Signed)
HPI: This is a very pleasant 51 year old man   I last saw him a little less than 3 years ago here in our office, February 2021.  This was for constipation, bloating.  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  I recommended a fiber supplement and also a screening colonoscopy.  It looks like for a variety of reasons including COVID vaccinations and also deaths in the family he never had that colonoscopy done.  He is here today for a different reason, upper abdominal pains, burning.  His weight is up 3 pounds since his last office visit here about 3 years ago.  Blood work December 2022 CBC was normal, complete metabolic profile was normal, lipase was normal, his CK test was slightly elevated and his primary care physician was planning to repeat it.  He tells me that he did try a fiber supplement for his alternating bowel patterns 2 or 3 years ago that helped somewhat but not completely.    He would have followed up sooner but his son was killed in a carjacking and he is still obviously grieving about that  For several months he has had upper abdominal, epigastric intermittent burning.  This can last for 10 minutes.  It is not necessarily related to eating.  Sometimes it will cause him to have nausea or vomiting.  Omeprazole 20 mg pills helps a bit but not completely.  He takes his 3 hours before his breakfast meal.  He takes 4 ibuprofen 2 or 3 times per week for a variety of pains.  He has gained 6 pounds in the last few months.  He has no overt GI bleeding.    Review of systems: Pertinent positive and negative review of systems were noted in the above HPI section. All other review negative.   Past Medical History:  Diagnosis Date   Anxiety    IBS (irritable bowel syndrome)     Past Surgical History:  Procedure Laterality Date   left ACL arthroscopy      Current Outpatient Medications  Medication Sig Dispense Refill    hydrOXYzine (ATARAX) 10 MG tablet Take 1 tablet (10 mg total) by mouth 3 (three) times daily as needed. 30 tablet 0   hyoscyamine (LEVSIN) 0.125 MG tablet Take 1 tablet (0.125 mg total) by mouth every 6 (six) hours as needed for cramping. 30 tablet 0   omeprazole (PRILOSEC) 20 MG capsule Take 20 mg by mouth daily.     No current facility-administered medications for this visit.    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    Family History  Problem Relation Age of Onset   Hypertension Mother    Anemia Mother    Thyroid disease Father    Hypertension Father    Stomach cancer Neg Hx    Colon cancer Neg Hx    Pancreatic cancer Neg Hx     Social History   Socioeconomic History   Marital status: Married    Spouse name: Not on file   Number of children: Not on file   Years of education: Not on file   Highest education level: Not on file  Occupational History   Not on file  Tobacco Use   Smoking status: Former    Packs/day: 0.50    Years: 10.00    Pack years: 5.00    Types: Cigarettes    Quit date: 2018  Years since quitting: 5.0   Smokeless tobacco: Never  Vaping Use   Vaping Use: Never used  Substance and Sexual Activity   Alcohol use: Yes    Alcohol/week: 0.0 standard drinks    Comment: beer - only on ocassions   Drug use: No   Sexual activity: Yes  Other Topics Concern   Not on file  Social History Narrative   Not on file   Social Determinants of Health   Financial Resource Strain: Not on file  Food Insecurity: Not on file  Transportation Needs: Not on file  Physical Activity: Not on file  Stress: Not on file  Social Connections: Not on file  Intimate Partner Violence: Not on file     Physical Exam: BP (!) 146/88    Pulse 88    Ht 6\' 2"  (1.88 m)    Wt 235 lb (106.6 kg)    SpO2 98%    BMI 30.17 kg/m  Constitutional: generally well-appearing Psychiatric: alert and oriented  x3 Eyes: extraocular movements intact Mouth: oral pharynx moist, no lesions Neck: supple no lymphadenopathy Cardiovascular: heart regular rate and rhythm Lungs: clear to auscultation bilaterally Abdomen: soft, nontender, nondistended, no obvious ascites, no peritoneal signs, normal bowel sounds Extremities: no lower extremity edema bilaterally Skin: no lesions on visible extremities   Assessment and plan: 51 y.o. male with intermittent constipation, intermittent diarrhea, upper abdominal pains  I think NSAIDs might be playing a role here.  I recommended he try cutting back and instead use Tylenol for his pain if possible.  I advised that he stop taking the over-the-counter strength omeprazole and instead prescribed him omeprazole 40 mg pills 1 pill once daily shortly before his first meal of the day.  I recommended that he retry Citrucel fiber supplements for his alternating bowel habits.  He is due for colon cancer screening colonoscopy at the same time I think it would be a good idea to look in the stomach to check for peptic ulcer disease, H. pylori infection, significant GERD damage.  Please see the "Patient Instructions" section for addition details about the plan.   Owens Loffler, MD [facility] 04/28/2021, 11:13 AM  Cc: Wendie Agreste, MD  Total time on date of encounter was 45  minutes (this included time spent preparing to see the patient reviewing records; obtaining and/or reviewing separately obtained history; performing a medically appropriate exam and/or evaluation; counseling and educating the patient and family if present; ordering medications, tests or procedures if applicable; and documenting clinical information in the health record).

## 2021-04-28 NOTE — Patient Instructions (Addendum)
If you are age 51 or younger, your body mass index should be between 19-25. Your Body mass index is 30.17 kg/m. If this is out of the aformentioned range listed, please consider follow up with your Primary Care Provider.  ______________________________________________________  The [facility] providers would like to encourage you to use [facility] to communicate with providers for non-urgent requests or questions.  Due to long hold times on the telephone, sending your provider a message by [facility] may be a faster and more efficient way to get a response.  Please allow 48 business hours for a response.  Please remember that this is for non-urgent requests.  _______________________________________________________  Micheal Roberts have been scheduled for an endoscopy and colonoscopy. Please follow the written instructions given to you at your visit today. Please pick up your prep supplies at the pharmacy within the next 1-3 days. If you use inhalers (even only as needed), please bring them with you on the day of your procedure.  Due to recent changes in healthcare laws, you may see the results of your imaging and laboratory studies on MyChart before your provider has had a chance to review them.  We understand that in some cases there may be results that are confusing or concerning to you. Not all laboratory results come back in the same time frame and the provider may be waiting for multiple results in order to interpret others.  Please give [place] 48 hours in order for your provider to thoroughly review all the results before contacting the office for clarification of your results.   STOP: Omeprazole over-the-counter  We have sent the following medications to your pharmacy for you to pick up at your convenience:  START: Omeprazole 40mg  one capsule daily 30 minutes prior to breakfast meal each day.  Please start taking citrucel (orange flavored) powder fiber supplement.  This may cause some bloating at first but that  usually goes away. Begin with a small spoonful and work your way up to a large, heaping spoonful daily over a week.  Thank you for entrusting me with your care and choosing [facility].  Dr Ardis Hughs

## 2021-05-17 ENCOUNTER — Encounter: Payer: Self-pay | Admitting: Gastroenterology

## 2021-05-24 ENCOUNTER — Ambulatory Visit (AMBULATORY_SURGERY_CENTER): Payer: BC Managed Care – PPO | Admitting: Gastroenterology

## 2021-05-24 ENCOUNTER — Other Ambulatory Visit: Payer: Self-pay

## 2021-05-24 ENCOUNTER — Encounter: Payer: Self-pay | Admitting: Gastroenterology

## 2021-05-24 VITALS — BP 148/94 | HR 62 | Temp 97.5°F | Resp 16 | Ht 74.0 in | Wt 235.0 lb

## 2021-05-24 DIAGNOSIS — R194 Change in bowel habit: Secondary | ICD-10-CM

## 2021-05-24 DIAGNOSIS — Z1211 Encounter for screening for malignant neoplasm of colon: Secondary | ICD-10-CM | POA: Diagnosis not present

## 2021-05-24 DIAGNOSIS — R1010 Upper abdominal pain, unspecified: Secondary | ICD-10-CM | POA: Diagnosis present

## 2021-05-24 DIAGNOSIS — K219 Gastro-esophageal reflux disease without esophagitis: Secondary | ICD-10-CM

## 2021-05-24 DIAGNOSIS — D123 Benign neoplasm of transverse colon: Secondary | ICD-10-CM | POA: Diagnosis not present

## 2021-05-24 MED ORDER — SODIUM CHLORIDE 0.9 % IV SOLN: 500.0000 mL | Freq: Once | INTRAVENOUS | Status: DC

## 2021-05-24 MED ORDER — OMEPRAZOLE 40 MG PO CPDR
40.0000 mg | DELAYED_RELEASE_CAPSULE | Freq: Every day | ORAL | 3 refills | Status: DC
Start: 2021-05-24 — End: 2022-07-04

## 2021-05-24 NOTE — Op Note (Addendum)
[facility] Patient Name: Micheal Roberts Procedure Date: 05/24/2021 1:47 PM MRN: [id number] Endoscopist: Milus Banister , MD Age: 51 Referring MD:  Date of Birth: August 11, 1970 Gender: Male Account #: [id number] Procedure:                Upper GI endoscopy Indications:              Upper abdominal pain Medicines:                Monitored Anesthesia Care Procedure:                Pre-Anesthesia Assessment:                           - Prior to the procedure, a History and Physical                            was performed, and patient medications and                            allergies were reviewed. The patient's tolerance of                            previous anesthesia was also reviewed. The risks                            and benefits of the procedure and the sedation                            options and risks were discussed with the patient.                            All questions were answered, and informed consent                            was obtained. Prior Anticoagulants: The patient has                            taken no previous anticoagulant or antiplatelet                            agents. ASA Grade Assessment: III - A patient with                            severe systemic disease. After reviewing the risks                            and benefits, the patient was deemed in                            satisfactory condition to undergo the procedure.                           - Prior to the procedure, a History and Physical  was performed, and patient medications and                            allergies were reviewed. The patient's tolerance of                            previous anesthesia was also reviewed. The risks                            and benefits of the procedure and the sedation                            options and risks were discussed with the patient.                            All questions were answered, and  informed consent                            was obtained. Prior Anticoagulants: The patient has                            taken no previous anticoagulant or antiplatelet                            agents. ASA Grade Assessment: III - A patient with                            severe systemic disease. After reviewing the risks                            and benefits, the patient was deemed in                            satisfactory condition to undergo the procedure.                           After obtaining informed consent, the endoscope was                            passed under direct vision. Throughout the                            procedure, the patient's blood pressure, pulse, and                            oxygen saturations were monitored continuously. The                            GIF [id number] #[id number] was introduced through the                            mouth, and advanced to the second part of duodenum.  The upper GI endoscopy was accomplished without                            difficulty. The patient tolerated the procedure                            well. Scope In: 1:33:33 PM Scope Out: 1:44:59 PM Scope Withdrawal Time: 0 hours 8 minutes 38 seconds  Total Procedure Duration: 0 hours 11 minutes 26 seconds  Findings:                 The esophagus was normal.                           The stomach was normal.                           The examined duodenum was normal. Complications:            No immediate complications. Estimated blood loss:                            None. Estimated Blood Loss:     Estimated blood loss: none. Impression:               - Normal UGI tract Recommendation:           - Patient has a contact number available for                            emergencies. The signs and symptoms of potential                            delayed complications were discussed with the                            patient. Return to normal activities  tomorrow.                            Written discharge instructions were provided to the                            patient.                           - Resume previous diet.                           - Continue present medications. Continue the                            omprazole 40mg  pills, one pill daily. Continue the                            fiber supplement daily. Continue avoiding NSAID                            type OTC pain medicines. Milus Banister,  MD 05/24/2021 1:54:44 PM This report has been signed electronically.

## 2021-05-24 NOTE — Progress Notes (Signed)
°  The recent H&P (dated 04/28/2021) was reviewed, the patient was examined and there is no change in the patients condition since that H&P was completed.   Micheal Roberts  05/24/2021, 1:23 PM

## 2021-05-24 NOTE — Progress Notes (Signed)
Called to room to assist during endoscopic procedure.  Patient ID and intended procedure confirmed with present staff. Received instructions for my participation in the procedure from the performing physician.  

## 2021-05-24 NOTE — Op Note (Addendum)
[facility] Patient Name: Micheal Roberts Procedure Date: 05/24/2021 1:45 PM MRN: [id number] Endoscopist: Milus Banister , MD Age: 51 Referring MD:  Date of Birth: 05/09/1970 Gender: Male Account #: [id number] Procedure:                Colonoscopy Indications:              Screening for colorectal malignant neoplasm Medicines:                Monitored Anesthesia Care Procedure:                Pre-Anesthesia Assessment:                           - Prior to the procedure, a History and Physical                            was performed, and patient medications and                            allergies were reviewed. The patient's tolerance of                            previous anesthesia was also reviewed. The risks                            and benefits of the procedure and the sedation                            options and risks were discussed with the patient.                            All questions were answered, and informed consent                            was obtained. Prior Anticoagulants: The patient has                            taken no previous anticoagulant or antiplatelet                            agents. ASA Grade Assessment: III - A patient with                            severe systemic disease. After reviewing the risks                            and benefits, the patient was deemed in                            satisfactory condition to undergo the procedure.                           After obtaining informed consent, the colonoscope  was passed under direct vision. Throughout the                            procedure, the patient's blood pressure, pulse, and                            oxygen saturations were monitored continuously. The                            Colonoscope was introduced through the anus and                            advanced to the the cecum, identified by                            appendiceal orifice and  ileocecal valve. The                            colonoscopy was performed without difficulty. The                            patient tolerated the procedure well. The quality                            of the bowel preparation was good. The ileocecal                            valve, appendiceal orifice, and rectum were                            photographed.                           Images taken but not saved due to technical issues Scope In: Scope Out: Findings:                 A 2 mm polyp was found in the transverse colon. The                            polyp was sessile. Biopsies were taken with a cold                            forceps for histology.                           The exam was otherwise without abnormality on                            direct and retroflexion views. Complications:            No immediate complications. Estimated blood loss:                            None. Estimated Blood Loss:     Estimated blood loss: none. Impression:               -  One 2 mm polyp in the transverse colon. Resected                            with biopsy forceps.                           - The examination was otherwise normal on direct                            and retroflexion views. Recommendation:           - Await pathology results.                           - EGD now. Milus Banister, MD 05/24/2021 1:47:42 PM This report has been signed electronically.

## 2021-05-24 NOTE — Progress Notes (Signed)
Report to PACU, RN, vss, BBS= Clear.  

## 2021-05-24 NOTE — Patient Instructions (Signed)
Continue your omeprazole once daily.  Take it 1/2 hour before breakfast.  Try to avoid NSAIDS when possible ie: aspirin, aleve and ibuprofen.  YOU HAD AN ENDOSCOPIC PROCEDURE TODAY AT [facility]:   Refer to the procedure report that was given to you for any specific questions about what was found during the examination.  If the procedure report does not answer your questions, please call your gastroenterologist to clarify.  If you requested that your care partner not be given the details of your procedure findings, then the procedure report has been included in a sealed envelope for you to review at your convenience later.  YOU SHOULD EXPECT: Some feelings of bloating in the abdomen. Passage of more gas than usual.  Walking can help get rid of the air that was put into your GI tract during the procedure and reduce the bloating. If you had a lower endoscopy (such as a colonoscopy or flexible sigmoidoscopy) you may notice spotting of blood in your stool or on the toilet paper. If you underwent a bowel prep for your procedure, you may not have a normal bowel movement for a few days.  Please Note:  You might notice some irritation and congestion in your nose or some drainage.  This is from the oxygen used during your procedure.  There is no need for concern and it should clear up in a day or so.  SYMPTOMS TO REPORT IMMEDIATELY:  Following lower endoscopy (colonoscopy or flexible sigmoidoscopy):  Excessive amounts of blood in the stool  Significant tenderness or worsening of abdominal pains  Swelling of the abdomen that is new, acute  Fever of 100F or higher  Following upper endoscopy (EGD)  Vomiting of blood or coffee ground material  New chest pain or pain under the shoulder blades  Painful or persistently difficult swallowing  New shortness of breath  Fever of 100F or higher  Black, tarry-looking stools  For urgent or emergent issues, a gastroenterologist can be reached at  any hour by calling [phone]. Do not use MyChart messaging for urgent concerns.    DIET:  We do recommend a small meal at first, but then you may proceed to your regular diet.  Drink plenty of fluids but you should avoid alcoholic beverages for 24 hours. Try to increase the fiber in your diet, and drink plenty of water.  You may continue your fiber supplement.  ACTIVITY:  You should plan to take it easy for the rest of today and you should NOT DRIVE or use heavy machinery until tomorrow (because of the sedation medicines used during the test).    FOLLOW UP: Our staff will call the number listed on your records 48-72 hours following your procedure to check on you and address any questions or concerns that you may have regarding the information given to you following your procedure. If we do not reach you, we will leave a message.  We will attempt to reach you two times.  During this call, we will ask if you have developed any symptoms of COVID 19. If you develop any symptoms (ie: fever, flu-like symptoms, shortness of breath, cough etc.) before then, please call [phone].  If you test positive for Covid 19 in the 2 weeks post procedure, please call and report this information to [place].    If any biopsies were taken you will be contacted by phone or by letter within the next 1-3 weeks.  Please call us at [phone] if you  have not heard about the biopsies in 3 weeks.    SIGNATURES/CONFIDENTIALITY: You and/or your care partner have signed paperwork which will be entered into your electronic medical record.  These signatures attest to the fact that that the information above on your After Visit Summary has been reviewed and is understood.  Full responsibility of the confidentiality of this discharge information lies with you and/or your care-partner.

## 2021-05-26 ENCOUNTER — Telehealth: Payer: Self-pay

## 2021-05-26 NOTE — Telephone Encounter (Signed)
°  Follow up Call-  Call back number 05/24/2021  Post procedure Call Back phone  # [phone]  Permission to leave phone message Yes  Some recent data might be hidden     Patient questions:  Do you have a fever, pain , or abdominal swelling? No. Pain Score  0 *  Have you tolerated food without any problems? Yes.    Have you been able to return to your normal activities? Yes.    Do you have any questions about your discharge instructions: Diet   No. Medications  No. Follow up visit  No.  Do you have questions or concerns about your Care? No.  Actions: * If pain score is 4 or above: No action needed, pain <4.  Have you developed a fever since your procedure? No   2.   Have you had an respiratory symptoms (SOB or cough) since your procedure? no  3.   Have you tested positive for COVID 19 since your procedure no  4.   Have you had any family members/close contacts diagnosed with the COVID 19 since your procedure?  no   If yes to any of these questions please route to Joylene John, RN and Joella Prince, RN

## 2021-05-27 ENCOUNTER — Institutional Professional Consult (permissible substitution): Payer: Self-pay | Admitting: Neurology

## 2021-05-31 ENCOUNTER — Encounter: Payer: Self-pay | Admitting: Gastroenterology

## 2021-06-16 ENCOUNTER — Ambulatory Visit: Payer: BC Managed Care – PPO | Admitting: Neurology

## 2021-06-16 ENCOUNTER — Encounter: Payer: Self-pay | Admitting: Neurology

## 2021-06-16 ENCOUNTER — Other Ambulatory Visit: Payer: Self-pay

## 2021-06-16 VITALS — BP 156/98 | HR 66 | Ht 74.0 in | Wt 240.2 lb

## 2021-06-16 DIAGNOSIS — R0683 Snoring: Secondary | ICD-10-CM

## 2021-06-16 DIAGNOSIS — E669 Obesity, unspecified: Secondary | ICD-10-CM

## 2021-06-16 DIAGNOSIS — R0681 Apnea, not elsewhere classified: Secondary | ICD-10-CM

## 2021-06-16 DIAGNOSIS — R351 Nocturia: Secondary | ICD-10-CM

## 2021-06-16 DIAGNOSIS — R519 Headache, unspecified: Secondary | ICD-10-CM

## 2021-06-16 DIAGNOSIS — G4761 Periodic limb movement disorder: Secondary | ICD-10-CM

## 2021-06-16 DIAGNOSIS — G2581 Restless legs syndrome: Secondary | ICD-10-CM

## 2021-06-16 DIAGNOSIS — G4719 Other hypersomnia: Secondary | ICD-10-CM

## 2021-06-16 DIAGNOSIS — E66811 Obesity, class 1: Secondary | ICD-10-CM

## 2021-06-16 NOTE — Progress Notes (Signed)
Subjective:    Patient ID: Micheal Roberts is a 51 y.o. male.  HPI    Star Age, MD, PhD [facility] [address] [address] [place], [place] [address]  Dear Dr. Carlota Raspberry,   I saw your patient, Micheal Roberts, upon your kind request, in my Sleep clinic today for initial consultation of his sleep disorder, in particular, concern for underlying obstructive sleep apnea.  The patient is unaccompanied today.  As you know, [name] is a 51 year old right-handed gentleman with an underlying medical history of anxiety, irritable bowel syndrome, arthralgia, and borderline obesity, who reports snoring and excessive daytime somnolence as well as witnessed apneas per wife's report.  I reviewed your office note from 04/08/2021.  His Epworth sleepiness score is 12 out of 24, fatigue severity score is 39 out of 63. Difficulty maintaining sleep.  He has occasionally woken up with a headache and may take Advil for this.  His headache is not severe but bothersome enough to take over-the-counter medication at times.  He reports nocturia about twice per average night.  He works for [employer] in [place].  Bedtime is generally around 9:30 PM and rise time around 5 AM.  He lives with his wife, he endorses recent stress in his life, he lost 2 of his children in the recent past, his son was killed 2 years ago, and his daughter passed away in her sleep; she had health issues including seizures, he reports. He has intermittent flareup of his anxiety and has a prescription for hydroxyzine but reports that he never tried it.  He has tried over-the-counter medication to help him sleep, currently takes an over-the-counter sleep aid on most nights.  He tried melatonin as well.  He drinks caffeine in the form of coffee, about 2 cups/day, soda cans, 3 to 4 cans/day.  He drinks alcohol very occasionally, maybe once a month and quit smoking some 4 years ago.  They have no pets at  the house.  He does endorse occasional restless leg symptoms and leg twitching at night and restless sleep.  His wife has noticed this as well.  He has occasionally woken up with a sense of gasping or choking.  They have a TV in the bedroom and it tends to stay on all night.  His Past Medical History Is Significant For: Past Medical History:  Diagnosis Date   Anxiety    IBS (irritable bowel syndrome)     His Past Surgical History Is Significant For: Past Surgical History:  Procedure Laterality Date   left ACL arthroscopy      His Family History Is Significant For: Family History  Problem Relation Age of Onset   Hypertension Mother    Anemia Mother    Thyroid disease Father    Hypertension Father    Stomach cancer Neg Hx    Colon cancer Neg Hx    Pancreatic cancer Neg Hx     His Social History Is Significant For: Social History   Socioeconomic History   Marital status: Married    Spouse name: Not on file   Number of children: Not on file   Years of education: Not on file   Highest education level: Not on file  Occupational History   Not on file  Tobacco Use   Smoking status: Former    Packs/day: 0.50    Years: 10.00    Pack years: 5.00    Types: Cigarettes    Quit date: 2018  Years since quitting: 5.1   Smokeless tobacco: Never  Vaping Use   Vaping Use: Never used  Substance and Sexual Activity   Alcohol use: Yes    Alcohol/week: 0.0 standard drinks    Comment: beer - only on ocassions   Drug use: No   Sexual activity: Yes  Other Topics Concern   Not on file  Social History Narrative   Caffeine: 1-2 cup daily.   Education:  one yr college.  (Did not finish).   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     Social Determinants of Health   Financial Resource Strain: Not on file  Food Insecurity: Not on file  Transportation Needs: Not on file  Physical Activity: Not on file  Stress: Not on file  Social Connections: Not on file    His Allergies Are:  Allergies   Allergen Reactions   Benzonatate Anaphylaxis  :   His Current Medications Are:  Outpatient Encounter Medications as of 06/16/2021  Medication Sig   hydrOXYzine (ATARAX) 10 MG tablet Take 1 tablet (10 mg total) by mouth 3 (three) times daily as needed.   hyoscyamine (LEVSIN) 0.125 MG tablet Take 1 tablet (0.125 mg total) by mouth every 6 (six) hours as needed for cramping.   methylcellulose (CITRUCEL) oral powder Take 1 packet by mouth daily.   omeprazole (PRILOSEC) 40 MG capsule Take 1 capsule (40 mg total) by mouth daily.   No facility-administered encounter medications on file as of 06/16/2021.  :   Review of Systems:  Out of a complete 14 point review of systems, all are reviewed and negative with the exception of these symptoms as listed below:   Review of Systems  Neurological:         Snoring, insomnia, nighttime wakening, possible pauses, daytime somnolence , ESS: 12,   FSS: 39     Objective:  Neurological Exam  Physical Exam Physical Examination:   Vitals:   06/16/21 0914  BP: (!) 156/98  Pulse: 66    General Examination: The patient is a very pleasant 51 y.o. male in no acute distress. He appears well-developed and well-nourished and well groomed.   HEENT: Normocephalic, atraumatic, pupils are equal, round and reactive to light, extraocular tracking is good without limitation to gaze excursion or nystagmus noted. Hearing is grossly intact. Face is symmetric with normal facial animation. Speech is clear with no dysarthria noted. There is no hypophonia. There is no lip, neck/head, jaw or voice tremor. Neck is supple with full range of passive and active motion. There are no carotid bruits on auscultation. Oropharynx exam reveals: mild mouth dryness, adequate dental hygiene and moderate airway crowding, due to redundant soft palate, uvula and tonsils not fully visualized, Mallampati class IV, neck circumference of 18-1/2 inches, no obvious overbite, mildly skewed teeth  alignment.  Chest: Clear to auscultation without wheezing, rhonchi or crackles noted.  Heart: S1+S2+0, regular and normal without murmurs, rubs or gallops noted.   Abdomen: Soft, non-tender and non-distended.  Extremities: There is no obvious edema.   Skin: Warm and dry without trophic changes noted.   Musculoskeletal: exam reveals no obvious joint deformities.   Neurologically:  Mental status: The patient is awake, alert and oriented in all 4 spheres. His immediate and remote memory, attention, language skills and fund of knowledge are appropriate. There is no evidence of aphasia, agnosia, apraxia or anomia. Speech is clear with normal prosody and enunciation. Thought process is linear. Mood is normal and affect is normal.  Cranial nerves II -  XII are as described above under HEENT exam.  Motor exam: Normal bulk, strength and tone is noted. There is no tremor. Fine motor skills and coordination: grossly intact.  Cerebellar testing: No dysmetria or intention tremor. There is no truncal or gait ataxia.  Sensory exam: intact to light touch in the upper and lower extremities.  Gait, station and balance: He stands easily. No veering to one side is noted. No leaning to one side is noted. Posture is age-appropriate and stance is narrow based. Gait shows normal stride length and normal pace. No problems turning are noted.   Assessment and Plan:   In summary, Laymon Stockert is a very pleasant 51 y.o.-year old male with an underlying medical history of anxiety, irritable bowel syndrome, arthralgia, and borderline obesity, whose history and physical exam are concerning for obstructive sleep apnea (OSA). I had a long chat with the patient about my findings and the diagnosis of OSA, its prognosis and treatment options. We talked about medical treatments, surgical interventions and non-pharmacological approaches. I explained in particular the risks and ramifications of untreated moderate to severe OSA,  especially with respect to developing cardiovascular disease down the Road, including congestive heart failure, difficult to treat hypertension, cardiac arrhythmias, or stroke. Even type 2 diabetes has, in part, been linked to untreated OSA. Symptoms of untreated OSA include daytime sleepiness, memory problems, mood irritability and mood disorder such as depression and anxiety, lack of energy, as well as recurrent headaches, especially morning headaches. We talked about trying to maintain a healthy lifestyle in general, as well as the importance of weight control. We also talked about the importance of good sleep hygiene. I recommended the following at this time: sleep study.  I outlined the differences between a laboratory attended sleep study versus home sleep test. I explained the sleep test procedure to the patient and also outlined possible surgical and non-surgical treatment options of OSA, including the use of a custom-made dental device (which would require a referral to a specialist dentist or oral surgeon), upper airway surgical options, such as traditional UPPP or a novel less invasive surgical option in the form of Inspire hypoglossal nerve stimulation (which would involve a referral to an ENT surgeon). I also explained the CPAP treatment option to the patient, who indicated that he would be willing to try CPAP if the need arises. I explained the importance of being compliant with PAP treatment, not only for insurance purposes but primarily to improve His symptoms, and for the patient's long term health benefit, including to reduce His cardiovascular risks. I answered all his questions today and the patient was in agreement. I plan to see him back after the sleep study is completed and encouraged him to call with any interim questions, concerns, problems or updates.   Thank you very much for allowing me to participate in the care of this nice patient. If I can be of any further assistance to you  please do not hesitate to call me at [phone].  Sincerely,   Star Age, MD, PhD

## 2021-06-16 NOTE — Patient Instructions (Signed)

## 2021-09-06 ENCOUNTER — Encounter: Payer: BC Managed Care – PPO | Admitting: Family Medicine

## 2021-10-04 ENCOUNTER — Telehealth: Payer: Self-pay | Admitting: Neurology

## 2021-10-04 NOTE — Telephone Encounter (Signed)
Patient has canceled three times and no showed on 10/03/21.

## 2021-10-09 ENCOUNTER — Other Ambulatory Visit: Payer: Self-pay | Admitting: Family Medicine

## 2021-10-09 DIAGNOSIS — F418 Other specified anxiety disorders: Secondary | ICD-10-CM

## 2021-10-09 DIAGNOSIS — L299 Pruritus, unspecified: Secondary | ICD-10-CM

## 2021-10-11 MED ORDER — HYDROXYZINE HCL 10 MG PO TABS: 10.0000 mg | ORAL_TABLET | Freq: Three times a day (TID) | ORAL | 0 refills | Status: DC | PRN

## 2021-12-29 ENCOUNTER — Other Ambulatory Visit: Payer: Self-pay | Admitting: Family Medicine

## 2021-12-29 DIAGNOSIS — F418 Other specified anxiety disorders: Secondary | ICD-10-CM

## 2021-12-29 DIAGNOSIS — L299 Pruritus, unspecified: Secondary | ICD-10-CM

## 2021-12-29 MED ORDER — HYDROXYZINE HCL 10 MG PO TABS: 10.0000 mg | ORAL_TABLET | Freq: Three times a day (TID) | ORAL | 0 refills | Status: DC | PRN

## 2022-02-24 ENCOUNTER — Ambulatory Visit (INDEPENDENT_AMBULATORY_CARE_PROVIDER_SITE_OTHER): Payer: BC Managed Care – PPO | Admitting: Family Medicine

## 2022-02-24 ENCOUNTER — Other Ambulatory Visit: Payer: Self-pay | Admitting: Family Medicine

## 2022-02-24 ENCOUNTER — Encounter: Payer: Self-pay | Admitting: Family Medicine

## 2022-02-24 VITALS — BP 130/70 | HR 87 | Temp 98.2°F | Ht 74.0 in | Wt 235.6 lb

## 2022-02-24 DIAGNOSIS — Z23 Encounter for immunization: Secondary | ICD-10-CM | POA: Diagnosis not present

## 2022-02-24 DIAGNOSIS — R42 Dizziness and giddiness: Secondary | ICD-10-CM | POA: Diagnosis not present

## 2022-02-24 DIAGNOSIS — R030 Elevated blood-pressure reading, without diagnosis of hypertension: Secondary | ICD-10-CM | POA: Diagnosis not present

## 2022-02-24 DIAGNOSIS — R0789 Other chest pain: Secondary | ICD-10-CM | POA: Diagnosis not present

## 2022-02-24 DIAGNOSIS — F418 Other specified anxiety disorders: Secondary | ICD-10-CM

## 2022-02-24 DIAGNOSIS — L299 Pruritus, unspecified: Secondary | ICD-10-CM

## 2022-02-24 LAB — CBC
HCT: 40.7 % (ref 39.0–52.0)
Hemoglobin: 13.5 g/dL (ref 13.0–17.0)
MCHC: 33.1 g/dL (ref 30.0–36.0)
MCV: 88.4 fl (ref 78.0–100.0)
Platelets: 225 10*3/uL (ref 150.0–400.0)
RBC: 4.61 Mil/uL (ref 4.22–5.81)
RDW: 13.4 % (ref 11.5–15.5)
WBC: 5.2 10*3/uL (ref 4.0–10.5)

## 2022-02-24 LAB — COMPREHENSIVE METABOLIC PANEL
ALT: 44 U/L (ref 0–53)
AST: 30 U/L (ref 0–37)
Albumin: 4.5 g/dL (ref 3.5–5.2)
Alkaline Phosphatase: 64 U/L (ref 39–117)
BUN: 10 mg/dL (ref 6–23)
CO2: 33 mEq/L — ABNORMAL HIGH (ref 19–32)
Calcium: 9.2 mg/dL (ref 8.4–10.5)
Chloride: 100 mEq/L (ref 96–112)
Creatinine, Ser: 1.22 mg/dL (ref 0.40–1.50)
GFR: 68.69 mL/min (ref >60.00)
Glucose, Bld: 86 mg/dL (ref 70–99)
Potassium: 3.8 mEq/L (ref 3.5–5.1)
Sodium: 140 mEq/L (ref 135–145)
Total Bilirubin: 0.9 mg/dL (ref 0.2–1.2)
Total Protein: 7.2 g/dL (ref 6.0–8.3)

## 2022-02-24 LAB — TSH: TSH: 1.21 u[IU]/mL (ref 0.35–5.50)

## 2022-02-24 MED ORDER — HYDROXYZINE HCL 10 MG PO TABS: 10.0000 mg | ORAL_TABLET | Freq: Three times a day (TID) | ORAL | 0 refills | Status: DC | PRN

## 2022-02-24 NOTE — Patient Instructions (Addendum)
Call Dr. Ardis Hughs about the food getting stuck and few episodes of needing to vomit.  It is important to follow up with sleep study. Please coordinate testing with your sleep specialist. Let me know if I can help.   I will check some labs for lightheadedness.  48-64 ounces water per day and continue regular meals.  Keep a record of your blood pressures outside of the office and bring them to the next office visit. Recheck next few weeks but if any worsening symptoms in the meantime be seen sooner or through the emergency room as we discussed.  Return to the clinic or go to the nearest emergency room if any of your symptoms worsen or new symptoms occur.    How to Take Your Blood Pressure Blood pressure is a measurement of how strongly your blood is pressing against the walls of your arteries. Arteries are blood vessels that carry blood from your heart throughout your body. Your health care provider takes your blood pressure at each office visit. You can also take your own blood pressure at home with a blood pressure monitor. You may need to take your own blood pressure to: Confirm a diagnosis of high blood pressure (hypertension). Monitor your blood pressure over time. Make sure your blood pressure medicine is working. Supplies needed: Blood pressure monitor. A chair to sit in. This should be a chair where you can sit upright with your back supported. Do not sit on a soft couch or an armchair. Table or desk. Small notebook and pencil or pen. How to prepare To get the most accurate reading, avoid the following for 30 minutes before you check your blood pressure: Drinking caffeine. Drinking alcohol. Eating. Smoking. Exercising. Five minutes before you check your blood pressure: Use the bathroom and urinate so that you have an empty bladder. Sit quietly in a chair. Do not talk. How to take your blood pressure To check your blood pressure, follow the instructions in the manual that came  with your blood pressure monitor. If you have a digital blood pressure monitor, the instructions may be as follows: Sit up straight in a chair. Place your feet on the floor. Do not cross your ankles or legs. Rest your left arm at the level of your heart on a table or desk or on the arm of a chair. Pull up your shirt sleeve. Wrap the blood pressure cuff around the upper part of your left arm, 1 inch (2.5 cm) above your elbow. It is best to wrap the cuff around bare skin. Fit the cuff snugly, but not too tightly, around your arm. You should be able to place only one finger between the cuff and your arm. Position the cord so that it rests in the bend of your elbow. Press the power button. Sit quietly while the cuff inflates and deflates. Read the digital reading on the monitor screen and write the numbers down (record them) in a notebook. Wait 2-3 minutes, then repeat the steps, starting at step 1. What does my blood pressure reading mean? A blood pressure reading consists of a higher number over a lower number. Ideally, your blood pressure should be below 120/80. The first ("top") number is called the systolic pressure. It is a measure of the pressure in your arteries as your heart beats. The second ("bottom") number is called the diastolic pressure. It is a measure of the pressure in your arteries as the heart relaxes. Blood pressure is classified into four stages. The following are  the stages for adults who do not have a short-term serious illness or a chronic condition. Systolic pressure and diastolic pressure are measured in a unit called mm Hg (millimeters of mercury).  Normal Systolic pressure: below 258. Diastolic pressure: below 80. Elevated Systolic pressure: 527-782. Diastolic pressure: below 80. Hypertension stage 1 Systolic pressure: 423-536. Diastolic pressure: 14-43. Hypertension stage 2 Systolic pressure: 154 or above. Diastolic pressure: 90 or above. You can have elevated  blood pressure or hypertension even if only the systolic or only the diastolic number in your reading is higher than normal. Follow these instructions at home: Medicines Take over-the-counter and prescription medicines only as told by your health care provider. Tell your health care provider if you are having any side effects from blood pressure medicine. General instructions Check your blood pressure as often as recommended by your health care provider. Check your blood pressure at the same time every day. Take your monitor to the next appointment with your health care provider to make sure that: You are using it correctly. It provides accurate readings. Understand what your goal blood pressure numbers are. Keep all follow-up visits. This is important. General tips Your health care provider can suggest a reliable monitor that will meet your needs. There are several types of home blood pressure monitors. Choose a monitor that has an arm cuff. Do not choose a monitor that measures your blood pressure from your wrist or finger. Choose a cuff that wraps snugly, not too tight or too loose, around your upper arm. You should be able to fit only one finger between your arm and the cuff. You can buy a blood pressure monitor at most drugstores or online. Where to find more information American Heart Association: www.heart.org Contact a health care provider if: Your blood pressure is consistently high. Your blood pressure is suddenly low. Get help right away if: Your systolic blood pressure is higher than 180. Your diastolic blood pressure is higher than 120. These symptoms may be an emergency. Get help right away. Call 911. Do not wait to see if the symptoms will go away. Do not drive yourself to the hospital. Summary Blood pressure is a measurement of how strongly your blood is pressing against the walls of your arteries. A blood pressure reading consists of a higher number over a lower  number. Ideally, your blood pressure should be below 120/80. Check your blood pressure at the same time every day. Avoid caffeine, alcohol, smoking, and exercise for 30 minutes prior to checking your blood pressure. These agents can affect the accuracy of the blood pressure reading. This information is not intended to replace advice given to you by your health care provider. Make sure you discuss any questions you have with your health care provider. Document Revised: 12/17/2020 Document Reviewed: 12/17/2020 Elsevier Patient Education  [place].   Dizziness Dizziness is a common problem. It is a feeling of unsteadiness or light-headedness. You may feel like you are about to faint. Dizziness can lead to injury if you stumble or fall. Anyone can become dizzy, but dizziness is more common in older adults. This condition can be caused by a number of things, including medicines, dehydration, or illness. Follow these instructions at home: Eating and drinking  Drink enough fluid to keep your urine pale yellow. This helps to keep you from becoming dehydrated. Try to drink more clear fluids, such as water. Do not drink alcohol. Limit your caffeine intake if told to do so by your health care  provider. Check ingredients and nutrition facts to see if a food or beverage contains caffeine. Limit your salt (sodium) intake if told to do so by your health care provider. Check ingredients and nutrition facts to see if a food or beverage contains sodium. Activity  Avoid making quick movements. Rise slowly from chairs and steady yourself until you feel okay. In the morning, first sit up on the side of the bed. When you feel okay, stand slowly while you hold onto something until you know that your balance is good. If you need to stand in one place for a long time, move your legs often. Tighten and relax the muscles in your legs while you are standing. Do not drive or use machinery if you feel  dizzy. Avoid bending down if you feel dizzy. Place items in your home so that they are easy for you to reach without leaning over. Lifestyle Do not use any products that contain nicotine or tobacco. These products include cigarettes, chewing tobacco, and vaping devices, such as e-cigarettes. If you need help quitting, ask your health care provider. Try to reduce your stress level by using methods such as yoga or meditation. Talk with your health care provider if you need help to manage your stress. General instructions Watch your dizziness for any changes. Take over-the-counter and prescription medicines only as told by your health care provider. Talk with your health care provider if you think that your dizziness is caused by a medicine that you are taking. Tell a friend or a family member that you are feeling dizzy. If he or she notices any changes in your behavior, have this person call your health care provider. Keep all follow-up visits. This is important. Contact a health care provider if: Your dizziness does not go away or you have new symptoms. Your dizziness or light-headedness gets worse. You feel nauseous. You have reduced hearing. You have a fever. You have neck pain or a stiff neck. Your dizziness leads to an injury or a fall. Get help right away if: You vomit or have diarrhea and are unable to eat or drink anything. You have problems talking, walking, swallowing, or using your arms, hands, or legs. You feel generally weak. You have any bleeding. You are not thinking clearly or you have trouble forming sentences. It may take a friend or family member to notice this. You have chest pain, abdominal pain, shortness of breath, or sweating. Your vision changes or you develop a severe headache. These symptoms may represent a serious problem that is an emergency. Do not wait to see if the symptoms will go away. Get medical help right away. Call your local emergency services (911 in  the [place] Do not drive yourself to the hospital. Summary Dizziness is a feeling of unsteadiness or light-headedness. This condition can be caused by a number of things, including medicines, dehydration, or illness. Anyone can become dizzy, but dizziness is more common in older adults. Drink enough fluid to keep your urine pale yellow. Do not drink alcohol. Avoid making quick movements if you feel dizzy. Monitor your dizziness for any changes. This information is not intended to replace advice given to you by your health care provider. Make sure you discuss any questions you have with your health care provider. Document Revised: 03/09/2020 Document Reviewed: 03/09/2020 Elsevier Patient Education  [place].

## 2022-02-24 NOTE — Progress Notes (Signed)
Subjective:  Patient ID: Micheal Roberts, male    DOB: 1971/01/25  Age: 51 y.o. MRN: [id number]  CC:  Chief Complaint  Patient presents with   Dizziness    Pt states he has lightheaded at times it comes and goes, pt states its even when he is sitting down      HPI Micheal Roberts presents for physical initially, but with acute concern above of lightheadedness. - asked that we reschedule physical.   Lightheadedness Episodic. 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 No room spinning. Faint feeling, but no syncope or near-syncope. More frequent recently - now 2 times per week.  No melena/hematochezia.  No focal weakness.  Eating 3 meals per day, drinking water during day - unknown amount.   Occasional gas sensation in chest - center of chest. Pressure feeling, no pain or palpitations. Not at time of lightheadedness. Few months. Not feeling heartburn, but food gets stuck at times and has to regurgitate food. 2 episodes. Past 6 months with pressure feeling.  Has GI - has not discussed food getting stuck or regurgitation with GI.  Tx: omeprazole daily.     He has been followed by sleep specialist for witnessed apneic spells, restless leg syndrome, periodic leg movement disorder, excessive daytime sleepiness, Office visit in March.  Home sleep test planned.  Has not scheduled the study, and based on notes appears he has no showed for follow-up multiple times with sleep specialist. Still some daytime somnolence.      History Patient Active Problem List   Diagnosis Date Noted   IBS (irritable bowel syndrome)    Past Medical History:  Diagnosis Date   Anxiety    IBS (irritable bowel syndrome)    Past Surgical History:  Procedure Laterality Date   left ACL arthroscopy     Allergies  Allergen Reactions   Benzonatate Anaphylaxis   Prior to Admission medications   Medication Sig Start Date End Date Taking? Authorizing  Provider  hydrOXYzine (ATARAX) 10 MG tablet Take 1 tablet (10 mg total) by mouth 3 (three) times daily as needed. 12/29/21  Yes Micheal Agreste, MD  hyoscyamine (LEVSIN) 0.125 MG tablet Take 1 tablet (0.125 mg total) by mouth every 6 (six) hours as needed for cramping. 03/18/21  Yes Micheal Agreste, MD  omeprazole (PRILOSEC) 40 MG capsule Take 1 capsule (40 mg total) by mouth daily. 05/24/21  Yes Micheal Banister, MD  methylcellulose (CITRUCEL) oral powder Take 1 packet by mouth daily. Patient not taking: Reported on 02/24/2022 04/28/21   Micheal Banister, MD   Social History   Socioeconomic History   Marital status: Married    Spouse name: Not on file   Number of children: Not on file   Years of education: Not on file   Highest education level: Not on file  Occupational History   Not on file  Tobacco Use   Smoking status: Former    Packs/day: 0.50    Years: 10.00    Total pack years: 5.00    Types: Cigarettes    Quit date: 2018    Years since quitting: 5.8   Smokeless tobacco: Never  Vaping Use   Vaping Use: Never used  Substance and Sexual Activity   Alcohol use: Yes    Alcohol/week: 0.0 standard drinks of alcohol    Comment: beer - only on ocassions   Drug use: No   Sexual activity: Yes  Other Topics Concern   Not on file  Social History Narrative   Caffeine: 1-2 cup daily.   Education:  one yr college.  (Did not finish).   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     Social Determinants of Health   Financial Resource Strain: Not on file  Food Insecurity: Not on file  Transportation Needs: Not on file  Physical Activity: Not on file  Stress: Not on file  Social Connections: Not on file  Intimate Partner Violence: Not on file    Review of Systems Per HPI.   Objective:   Vitals:   02/24/22 1128 02/24/22 1241  BP:  130/70  Pulse:  87  Temp: 98.2 F (36.8 C)   SpO2: 97%   Weight: 235 lb 9.6 oz (106.9 kg)   Height: '6\' 2"'$  (1.88 m)       Physical Exam Vitals reviewed.   Constitutional:      Appearance: He is well-developed.  HENT:     Head: Normocephalic and atraumatic.  Neck:     Vascular: No carotid bruit or JVD.  Cardiovascular:     Rate and Rhythm: Normal rate and regular rhythm.     Heart sounds: Normal heart sounds. No murmur heard. Pulmonary:     Effort: Pulmonary effort is normal.     Breath sounds: Normal breath sounds. No rales.  Musculoskeletal:     Right lower leg: No edema.     Left lower leg: No edema.  Skin:    General: Skin is warm and dry.  Neurological:     General: No focal deficit present.     Mental Status: He is alert and oriented to person, place, and time.     Cranial Nerves: No cranial nerve deficit.     Sensory: No sensory deficit.     Motor: No weakness.     Gait: Gait normal.  Psychiatric:        Mood and Affect: Mood normal.    EKG, sinus rhythm, rate 60.  Flat T wave in lead III, V4 through V6.  No apparent acute findings.Compared to 12/05/2016, T waves appear to be more flat but no acute changes.  Assessment & Plan:  Micheal Roberts is a 51 y.o. male . Need for influenza vaccination - Plan: Flu Vaccine QUAD 6+ mos PF IM (Fluarix Quad PF)  Lightheadedness - Plan: CBC, Comprehensive metabolic panel, TSH, EKG 44-HQPR  Elevated blood pressure reading  Chest pressure - Plan: EKG 12-Lead  Episodic lightheadedness as above, not accompanied by chest symptoms, noted at separate times.  Chest symptoms with possible component of achalasia/dysphagia, recommended he call his gastroenterologist to discuss, continue to avoid trigger foods, continue PPI.  No acute findings seen on EKG.  Nonfocal neuro exam.  Check labs above, maintain hydration, recommended follow-up with sleep specialist for study as untreated sleep apnea potentially could contribute to some of his fatigue/lightheadedness symptoms.  Recheck next few weeks with ER precautions given.  Borderline elevated blood pressure noted, home monitoring recommended with  handout given.  Again recheck next few weeks with labs above.  No orders of the defined types were placed in this encounter.  Patient Instructions  Call Micheal Roberts about the food getting stuck and few episodes of needing to vomit.  It is important to follow up with sleep study. Please coordinate testing with your sleep specialist. Let me know if I can help.   I will check some labs for lightheadedness.  48-64 ounces water per day and continue regular  meals.  Keep a record of your blood pressures outside of the office and bring them to the next office visit. Recheck next few weeks but if any worsening symptoms in the meantime be seen sooner or through the emergency room as we discussed.  Return to the clinic or go to the nearest emergency room if any of your symptoms worsen or new symptoms occur.    How to Take Your Blood Pressure Blood pressure is a measurement of how strongly your blood is pressing against the walls of your arteries. Arteries are blood vessels that carry blood from your heart throughout your body. Your health care provider takes your blood pressure at each office visit. You can also take your own blood pressure at home with a blood pressure monitor. You may need to take your own blood pressure to: Confirm a diagnosis of high blood pressure (hypertension). Monitor your blood pressure over time. Make sure your blood pressure medicine is working. Supplies needed: Blood pressure monitor. A chair to sit in. This should be a chair where you can sit upright with your back supported. Do not sit on a soft couch or an armchair. Table or desk. Small notebook and pencil or pen. How to prepare To get the most accurate reading, avoid the following for 30 minutes before you check your blood pressure: Drinking caffeine. Drinking alcohol. Eating. Smoking. Exercising. Five minutes before you check your blood pressure: Use the bathroom and urinate so that you have an empty  bladder. Sit quietly in a chair. Do not talk. How to take your blood pressure To check your blood pressure, follow the instructions in the manual that came with your blood pressure monitor. If you have a digital blood pressure monitor, the instructions may be as follows: Sit up straight in a chair. Place your feet on the floor. Do not cross your ankles or legs. Rest your left arm at the level of your heart on a table or desk or on the arm of a chair. Pull up your shirt sleeve. Wrap the blood pressure cuff around the upper part of your left arm, 1 inch (2.5 cm) above your elbow. It is best to wrap the cuff around bare skin. Fit the cuff snugly, but not too tightly, around your arm. You should be able to place only one finger between the cuff and your arm. Position the cord so that it rests in the bend of your elbow. Press the power button. Sit quietly while the cuff inflates and deflates. Read the digital reading on the monitor screen and write the numbers down (record them) in a notebook. Wait 2-3 minutes, then repeat the steps, starting at step 1. What does my blood pressure reading mean? A blood pressure reading consists of a higher number over a lower number. Ideally, your blood pressure should be below 120/80. The first ("top") number is called the systolic pressure. It is a measure of the pressure in your arteries as your heart beats. The second ("bottom") number is called the diastolic pressure. It is a measure of the pressure in your arteries as the heart relaxes. Blood pressure is classified into four stages. The following are the stages for adults who do not have a short-term serious illness or a chronic condition. Systolic pressure and diastolic pressure are measured in a unit called mm Hg (millimeters of mercury).  Normal Systolic pressure: below 998. Diastolic pressure: below 80. Elevated Systolic pressure: 338-250. Diastolic pressure: below 80. Hypertension stage 1 Systolic  pressure: 539-767.  Diastolic pressure: 40-08. Hypertension stage 2 Systolic pressure: 676 or above. Diastolic pressure: 90 or above. You can have elevated blood pressure or hypertension even if only the systolic or only the diastolic number in your reading is higher than normal. Follow these instructions at home: Medicines Take over-the-counter and prescription medicines only as told by your health care provider. Tell your health care provider if you are having any side effects from blood pressure medicine. General instructions Check your blood pressure as often as recommended by your health care provider. Check your blood pressure at the same time every day. Take your monitor to the next appointment with your health care provider to make sure that: You are using it correctly. It provides accurate readings. Understand what your goal blood pressure numbers are. Keep all follow-up visits. This is important. General tips Your health care provider can suggest a reliable monitor that will meet your needs. There are several types of home blood pressure monitors. Choose a monitor that has an arm cuff. Do not choose a monitor that measures your blood pressure from your wrist or finger. Choose a cuff that wraps snugly, not too tight or too loose, around your upper arm. You should be able to fit only one finger between your arm and the cuff. You can buy a blood pressure monitor at most drugstores or online. Where to find more information American Heart Association: www.heart.org Contact a health care provider if: Your blood pressure is consistently high. Your blood pressure is suddenly low. Get help right away if: Your systolic blood pressure is higher than 180. Your diastolic blood pressure is higher than 120. These symptoms may be an emergency. Get help right away. Call 911. Do not wait to see if the symptoms will go away. Do not drive yourself to the hospital. Summary Blood pressure is a  measurement of how strongly your blood is pressing against the walls of your arteries. A blood pressure reading consists of a higher number over a lower number. Ideally, your blood pressure should be below 120/80. Check your blood pressure at the same time every day. Avoid caffeine, alcohol, smoking, and exercise for 30 minutes prior to checking your blood pressure. These agents can affect the accuracy of the blood pressure reading. This information is not intended to replace advice given to you by your health care provider. Make sure you discuss any questions you have with your health care provider. Document Revised: 12/17/2020 Document Reviewed: 12/17/2020 Elsevier Patient Education  [place].   Dizziness Dizziness is a common problem. It is a feeling of unsteadiness or light-headedness. You may feel like you are about to faint. Dizziness can lead to injury if you stumble or fall. Anyone can become dizzy, but dizziness is more common in older adults. This condition can be caused by a number of things, including medicines, dehydration, or illness. Follow these instructions at home: Eating and drinking  Drink enough fluid to keep your urine pale yellow. This helps to keep you from becoming dehydrated. Try to drink more clear fluids, such as water. Do not drink alcohol. Limit your caffeine intake if told to do so by your health care provider. Check ingredients and nutrition facts to see if a food or beverage contains caffeine. Limit your salt (sodium) intake if told to do so by your health care provider. Check ingredients and nutrition facts to see if a food or beverage contains sodium. Activity  Avoid making quick movements. Rise slowly from chairs and steady yourself  until you feel okay. In the morning, first sit up on the side of the bed. When you feel okay, stand slowly while you hold onto something until you know that your balance is good. If you need to stand in one place for a  long time, move your legs often. Tighten and relax the muscles in your legs while you are standing. Do not drive or use machinery if you feel dizzy. Avoid bending down if you feel dizzy. Place items in your home so that they are easy for you to reach without leaning over. Lifestyle Do not use any products that contain nicotine or tobacco. These products include cigarettes, chewing tobacco, and vaping devices, such as e-cigarettes. If you need help quitting, ask your health care provider. Try to reduce your stress level by using methods such as yoga or meditation. Talk with your health care provider if you need help to manage your stress. General instructions Watch your dizziness for any changes. Take over-the-counter and prescription medicines only as told by your health care provider. Talk with your health care provider if you think that your dizziness is caused by a medicine that you are taking. Tell a friend or a family member that you are feeling dizzy. If he or she notices any changes in your behavior, have this person call your health care provider. Keep all follow-up visits. This is important. Contact a health care provider if: Your dizziness does not go away or you have new symptoms. Your dizziness or light-headedness gets worse. You feel nauseous. You have reduced hearing. You have a fever. You have neck pain or a stiff neck. Your dizziness leads to an injury or a fall. Get help right away if: You vomit or have diarrhea and are unable to eat or drink anything. You have problems talking, walking, swallowing, or using your arms, hands, or legs. You feel generally weak. You have any bleeding. You are not thinking clearly or you have trouble forming sentences. It may take a friend or family member to notice this. You have chest pain, abdominal pain, shortness of breath, or sweating. Your vision changes or you develop a severe headache. These symptoms may represent a serious problem  that is an emergency. Do not wait to see if the symptoms will go away. Get medical help right away. Call your local emergency services (911 in the [place] Do not drive yourself to the hospital. Summary Dizziness is a feeling of unsteadiness or light-headedness. This condition can be caused by a number of things, including medicines, dehydration, or illness. Anyone can become dizzy, but dizziness is more common in older adults. Drink enough fluid to keep your urine pale yellow. Do not drink alcohol. Avoid making quick movements if you feel dizzy. Monitor your dizziness for any changes. This information is not intended to replace advice given to you by your health care provider. Make sure you discuss any questions you have with your health care provider. Document Revised: 03/09/2020 Document Reviewed: 03/09/2020 Elsevier Patient Education  2023 [place],   Merri Ray, MD [place] 02/24/22 12:59 PM

## 2022-03-09 ENCOUNTER — Other Ambulatory Visit: Payer: Self-pay | Admitting: Family Medicine

## 2022-03-09 ENCOUNTER — Other Ambulatory Visit: Payer: Self-pay | Admitting: Lab

## 2022-03-09 ENCOUNTER — Encounter: Payer: Self-pay | Admitting: Family Medicine

## 2022-03-09 ENCOUNTER — Ambulatory Visit: Payer: BC Managed Care – PPO | Admitting: Family Medicine

## 2022-03-09 VITALS — BP 150/100 | HR 70 | Temp 98.1°F | Ht 74.0 in | Wt 234.8 lb

## 2022-03-09 DIAGNOSIS — I10 Essential (primary) hypertension: Secondary | ICD-10-CM

## 2022-03-09 DIAGNOSIS — R400 Somnolence: Secondary | ICD-10-CM | POA: Diagnosis not present

## 2022-03-09 DIAGNOSIS — R519 Headache, unspecified: Secondary | ICD-10-CM | POA: Diagnosis not present

## 2022-03-09 DIAGNOSIS — R42 Dizziness and giddiness: Secondary | ICD-10-CM

## 2022-03-09 MED ORDER — AMLODIPINE BESYLATE 2.5 MG PO TABS: 2.5000 mg | ORAL_TABLET | Freq: Every day | ORAL | 1 refills | Status: DC

## 2022-03-09 NOTE — Patient Instructions (Addendum)
Goal of 64 ounces water per day.  I will refer you to neuro to discuss the lightheadedness and headaches. This could be related to sleep apnea if you do have that condition - keep follow up with sleep specialist - let me know if they do not call you back.  Keep follow up with gastroenterology.  Start blood pressure pill once per day. Recheck in 1 months.   Return to the clinic or go to the nearest emergency room if any of your symptoms worsen or new symptoms occur.  Dizziness Dizziness is a common problem. It is a feeling of unsteadiness or light-headedness. You may feel like you are about to faint. Dizziness can lead to injury if you stumble or fall. Anyone can become dizzy, but dizziness is more common in older adults. This condition can be caused by a number of things, including medicines, dehydration, or illness. Follow these instructions at home: Eating and drinking  Drink enough fluid to keep your urine pale yellow. This helps to keep you from becoming dehydrated. Try to drink more clear fluids, such as water. Do not drink alcohol. Limit your caffeine intake if told to do so by your health care provider. Check ingredients and nutrition facts to see if a food or beverage contains caffeine. Limit your salt (sodium) intake if told to do so by your health care provider. Check ingredients and nutrition facts to see if a food or beverage contains sodium. Activity  Avoid making quick movements. Rise slowly from chairs and steady yourself until you feel okay. In the morning, first sit up on the side of the bed. When you feel okay, stand slowly while you hold onto something until you know that your balance is good. If you need to stand in one place for a long time, move your legs often. Tighten and relax the muscles in your legs while you are standing. Do not drive or use machinery if you feel dizzy. Avoid bending down if you feel dizzy. Place items in your home so that they are easy for you to  reach without leaning over. Lifestyle Do not use any products that contain nicotine or tobacco. These products include cigarettes, chewing tobacco, and vaping devices, such as e-cigarettes. If you need help quitting, ask your health care provider. Try to reduce your stress level by using methods such as yoga or meditation. Talk with your health care provider if you need help to manage your stress. General instructions Watch your dizziness for any changes. Take over-the-counter and prescription medicines only as told by your health care provider. Talk with your health care provider if you think that your dizziness is caused by a medicine that you are taking. Tell a friend or a family member that you are feeling dizzy. If he or she notices any changes in your behavior, have this person call your health care provider. Keep all follow-up visits. This is important. Contact a health care provider if: Your dizziness does not go away or you have new symptoms. Your dizziness or light-headedness gets worse. You feel nauseous. You have reduced hearing. You have a fever. You have neck pain or a stiff neck. Your dizziness leads to an injury or a fall. Get help right away if: You vomit or have diarrhea and are unable to eat or drink anything. You have problems talking, walking, swallowing, or using your arms, hands, or legs. You feel generally weak. You have any bleeding. You are not thinking clearly or you have trouble forming  sentences. It may take a friend or family member to notice this. You have chest pain, abdominal pain, shortness of breath, or sweating. Your vision changes or you develop a severe headache. These symptoms may represent a serious problem that is an emergency. Do not wait to see if the symptoms will go away. Get medical help right away. Call your local emergency services (911 in the [place] Do not drive yourself to the hospital. Summary Dizziness is a feeling of unsteadiness or  light-headedness. This condition can be caused by a number of things, including medicines, dehydration, or illness. Anyone can become dizzy, but dizziness is more common in older adults. Drink enough fluid to keep your urine pale yellow. Do not drink alcohol. Avoid making quick movements if you feel dizzy. Monitor your dizziness for any changes. This information is not intended to replace advice given to you by your health care provider. Make sure you discuss any questions you have with your health care provider. Document Revised: 03/09/2020 Document Reviewed: 03/09/2020 Elsevier Patient Education  [place] Your Hypertension Hypertension, also called high blood pressure, is when the force of the blood pressing against the walls of the arteries is too strong. Arteries are blood vessels that carry blood from your heart throughout your body. Hypertension forces the heart to work harder to pump blood and may cause the arteries to become narrow or stiff. Understanding blood pressure readings A blood pressure reading includes a higher number over a lower number: The first, or top, number is called the systolic pressure. It is a measure of the pressure in your arteries as your heart beats. The second, or bottom number, is called the diastolic pressure. It is a measure of the pressure in your arteries as the heart relaxes. For most people, a normal blood pressure is below 120/80. Your personal target blood pressure may vary depending on your medical conditions, your age, and other factors. Blood pressure is classified into four stages. Based on your blood pressure reading, your health care provider may use the following stages to determine what type of treatment you need, if any. Systolic pressure and diastolic pressure are measured in a unit called millimeters of mercury (mmHg). Normal Systolic pressure: below 970. Diastolic pressure: below 80. Elevated Systolic pressure:  263-785. Diastolic pressure: below 80. Hypertension stage 1 Systolic pressure: 885-027. Diastolic pressure: 74-12. Hypertension stage 2 Systolic pressure: 878 or above. Diastolic pressure: 90 or above. How can this condition affect me? Managing your hypertension is very important. Over time, hypertension can damage the arteries and decrease blood flow to parts of the body, including the brain, heart, and kidneys. Having untreated or uncontrolled hypertension can lead to: A heart attack. A stroke. A weakened blood vessel (aneurysm). Heart failure. Kidney damage. Eye damage. Memory and concentration problems. Vascular dementia. What actions can I take to manage this condition? Hypertension can be managed by making lifestyle changes and possibly by taking medicines. Your health care provider will help you make a plan to bring your blood pressure within a normal range. You may be referred for counseling on a healthy diet and physical activity. Nutrition  Eat a diet that is high in fiber and potassium, and low in salt (sodium), added sugar, and fat. An example eating plan is called the DASH diet. DASH stands for Dietary Approaches to Stop Hypertension. To eat this way: Eat plenty of fresh fruits and vegetables. Try to fill one-half of your plate at each meal with fruits and  vegetables. Eat whole grains, such as whole-wheat pasta, brown rice, or whole-grain bread. Fill about one-fourth of your plate with whole grains. Eat low-fat dairy products. Avoid fatty cuts of meat, processed or cured meats, and poultry with skin. Fill about one-fourth of your plate with lean proteins such as fish, chicken without skin, beans, eggs, and tofu. Avoid pre-made and processed foods. These tend to be higher in sodium, added sugar, and fat. Reduce your daily sodium intake. Many people with hypertension should eat less than 1,500 mg of sodium a day. Lifestyle  Work with your health care provider to maintain a  healthy body weight or to lose weight. Ask what an ideal weight is for you. Get at least 30 minutes of exercise that causes your heart to beat faster (aerobic exercise) most days of the week. Activities may include walking, swimming, or biking. Include exercise to strengthen your muscles (resistance exercise), such as weight lifting, as part of your weekly exercise routine. Try to do these types of exercises for 30 minutes at least 3 days a week. Do not use any products that contain nicotine or tobacco. These products include cigarettes, chewing tobacco, and vaping devices, such as e-cigarettes. If you need help quitting, ask your health care provider. Control any long-term (chronic) conditions you have, such as high cholesterol or diabetes. Identify your sources of stress and find ways to manage stress. This may include meditation, deep breathing, or making time for fun activities. Alcohol use Do not drink alcohol if: Your health care provider tells you not to drink. You are pregnant, may be pregnant, or are planning to become pregnant. If you drink alcohol: Limit how much you have to: 0-1 drink a day for women. 0-2 drinks a day for men. Know how much alcohol is in your drink. In the [place] one drink equals one 12 oz bottle of beer (355 mL), one 5 oz glass of wine (148 mL), or one 1 oz glass of hard liquor (44 mL). Medicines Your health care provider may prescribe medicine if lifestyle changes are not enough to get your blood pressure under control and if: Your systolic blood pressure is 130 or higher. Your diastolic blood pressure is 80 or higher. Take medicines only as told by your health care provider. Follow the directions carefully. Blood pressure medicines must be taken as told by your health care provider. The medicine does not work as well when you skip doses. Skipping doses also puts you at risk for problems. Monitoring Before you monitor your blood pressure: Do not smoke, drink  caffeinated beverages, or exercise within 30 minutes before taking a measurement. Use the bathroom and empty your bladder (urinate). Sit quietly for at least 5 minutes before taking measurements. Monitor your blood pressure at home as told by your health care provider. To do this: Sit with your back straight and supported. Place your feet flat on the floor. Do not cross your legs. Support your arm on a flat surface, such as a table. Make sure your upper arm is at heart level. Each time you measure, take two or three readings one minute apart and record the results. You may also need to have your blood pressure checked regularly by your health care provider. General information Talk with your health care provider about your diet, exercise habits, and other lifestyle factors that may be contributing to hypertension. Review all the medicines you take with your health care provider because there may be side effects or interactions. Keep all follow-up  visits. Your health care provider can help you create and adjust your plan for managing your high blood pressure. Where to find more information National Heart, Lung, and Blood Institute: https://wilson-eaton.com/ American Heart Association: www.heart.org Contact a health care provider if: You think you are having a reaction to medicines you have taken. You have repeated (recurrent) headaches. You feel dizzy. You have swelling in your ankles. You have trouble with your vision. Get help right away if: You develop a severe headache or confusion. You have unusual weakness or numbness, or you feel faint. You have severe pain in your chest or abdomen. You vomit repeatedly. You have trouble breathing. These symptoms may be an emergency. Get help right away. Call 911. Do not wait to see if the symptoms will go away. Do not drive yourself to the hospital. Summary Hypertension is when the force of blood pumping through your arteries is too strong. If this  condition is not controlled, it may put you at risk for serious complications. Your personal target blood pressure may vary depending on your medical conditions, your age, and other factors. For most people, a normal blood pressure is less than 120/80. Hypertension is managed by lifestyle changes, medicines, or both. Lifestyle changes to help manage hypertension include losing weight, eating a healthy, low-sodium diet, exercising more, stopping smoking, and limiting alcohol. This information is not intended to replace advice given to you by your health care provider. Make sure you discuss any questions you have with your health care provider. Document Revised: 12/17/2020 Document Reviewed: 12/17/2020 Elsevier Patient Education  [place].

## 2022-03-09 NOTE — Progress Notes (Unsigned)
Subjective:  Patient ID: Micheal Roberts, male    DOB: Aug 16, 1970  Age: 51 y.o. MRN: [id number]  CC:  Chief Complaint  Patient presents with   Dizziness    Pt states the dizziness still comes and goes, pt states he doesn't feel faint just lightheaded, he states that he could be doing something and just get the feeling, not positional     HPI Micheal Roberts presents for   Dizziness Follow-up from November 9 visit.  Episodic lightheadedness, dizziness off and on for about 6 months at random times.  Sometimes at work, sometimes at home.  No sensation of room spinning.  Faint feeling but no syncope or near syncope.  He was having episodes about 2 times per week without focal weakness melena hematochezia or new bowel changes.  He did report eating 3 meals per day, drinking water throughout the day but unknown amount at that time.  Had been seen previously by sleep specialist for RLS, witnessed apneic spells, PLMS and excessive daytime sleepiness.  Plan for home sleep test but had not been performed and was still experiencing some daytime somnolence.  Borderline elevated blood pressure last visit.No new meds given.CBC normal, TSH normal,  CO2 33, but otherwise CMP normal.  Since last visit - no changes in diet, but drinking more water and gatorade.  Still episodic lightheadedness. 2 times per week - only few minutes. About the same as last visit. No chest pain or pressure, no weakness. Occasional headaches, every few days. Occipital, not worst HA of life. Tx. Tylenol only if needed.  Call placed to sleep specialist. Waiting on call back. No heart palpitations. No anxiety or new stress.   He did report an occasional gas sensation in the center of his chest, slight pressure feeling without pain or palpitations but that was not at time of his lightheadedness, 2 separate issues.  The symptoms have been going on for few months.  He is followed by gastroenterology, had not discussed the symptoms with them  including the feeling of food getting stuck.  Continued on PPI, advised to contact his gastroenterologist and avoid trigger foods for reflux or foods that had gotten stuck previously.  No acute findings noted on EKG last visit. He called GI - waiting on call back.   History Patient Active Problem List   Diagnosis Date Noted   IBS (irritable bowel syndrome)    Past Medical History:  Diagnosis Date   Anxiety    IBS (irritable bowel syndrome)    Past Surgical History:  Procedure Laterality Date   left ACL arthroscopy     Allergies  Allergen Reactions   Benzonatate Anaphylaxis   Prior to Admission medications   Medication Sig Start Date End Date Taking? Authorizing Provider  hydrOXYzine (ATARAX) 10 MG tablet Take 1 tablet (10 mg total) by mouth 3 (three) times daily as needed. 02/24/22  Yes Wendie Agreste, MD  omeprazole (PRILOSEC) 40 MG capsule Take 1 capsule (40 mg total) by mouth daily. 05/24/21  Yes Milus Banister, MD  hyoscyamine (LEVSIN) 0.125 MG tablet Take 1 tablet (0.125 mg total) by mouth every 6 (six) hours as needed for cramping. Patient not taking: Reported on 03/09/2022 03/18/21   Wendie Agreste, MD  methylcellulose (CITRUCEL) oral powder Take 1 packet by mouth daily. Patient not taking: Reported on 02/24/2022 04/28/21   Milus Banister, MD   Social History   Socioeconomic History   Marital status: Married    Spouse name: Not on  file   Number of children: Not on file   Years of education: Not on file   Highest education level: Not on file  Occupational History   Not on file  Tobacco Use   Smoking status: Former    Packs/day: 0.50    Years: 10.00    Total pack years: 5.00    Types: Cigarettes    Quit date: 2018    Years since quitting: 5.8   Smokeless tobacco: Never  Vaping Use   Vaping Use: Never used  Substance and Sexual Activity   Alcohol use: Yes    Alcohol/week: 0.0 standard drinks of alcohol    Comment: beer - only on ocassions   Drug use: No    Sexual activity: Yes  Other Topics Concern   Not on file  Social History Narrative   Caffeine: 1-2 cup daily.   Education:  one yr college.  (Did not finish).   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     Social Determinants of Health   Financial Resource Strain: Not on file  Food Insecurity: Not on file  Transportation Needs: Not on file  Physical Activity: Not on file  Stress: Not on file  Social Connections: Not on file  Intimate Partner Violence: Not on file    Review of Systems Per HPI.   Objective:   Vitals:   03/09/22 1104 03/09/22 1135  BP: (!) 142/82 (!) 150/100  Pulse: 70   Temp: 98.1 F (36.7 C)   SpO2: 97%   Weight: 234 lb 12.8 oz (106.5 kg)   Height: '6\' 2"'$  (1.88 m)    BP Readings from Last 3 Encounters:  03/09/22 (!) 150/100  02/24/22 130/70  06/16/21 (!) 156/98     Physical Exam Vitals reviewed.  Constitutional:      Appearance: He is well-developed.  HENT:     Head: Normocephalic and atraumatic.  Neck:     Vascular: No carotid bruit or JVD.  Cardiovascular:     Rate and Rhythm: Normal rate and regular rhythm.     Heart sounds: Normal heart sounds. No murmur heard. Pulmonary:     Effort: Pulmonary effort is normal.     Breath sounds: Normal breath sounds. No rales.  Musculoskeletal:     Right lower leg: No edema.     Left lower leg: No edema.  Skin:    General: Skin is warm and dry.  Neurological:     General: No focal deficit present.     Mental Status: He is alert and oriented to person, place, and time. Mental status is at baseline.     Cranial Nerves: No cranial nerve deficit.     Sensory: No sensory deficit.     Motor: No weakness.     Coordination: Coordination normal.     Gait: Gait normal.  Psychiatric:        Mood and Affect: Mood normal.        Assessment & Plan:  Micheal Roberts is a 51 y.o. male . No diagnosis found.   No orders of the defined types were placed in this encounter.  There are no Patient Instructions on file  for this visit.    Signed,   Merri Ray, MD [place] 03/09/22 11:52 AM

## 2022-03-10 ENCOUNTER — Encounter: Payer: Self-pay | Admitting: Family Medicine

## 2022-03-13 IMAGING — DX DG FOOT COMPLETE 3+V*R*
3 series · 3 of 3 positions shown · non-contrast
Comparison: None.

CLINICAL DATA: Right foot pain

EXAM:
RIGHT FOOT COMPLETE - 3+ VIEW

[foot ap]
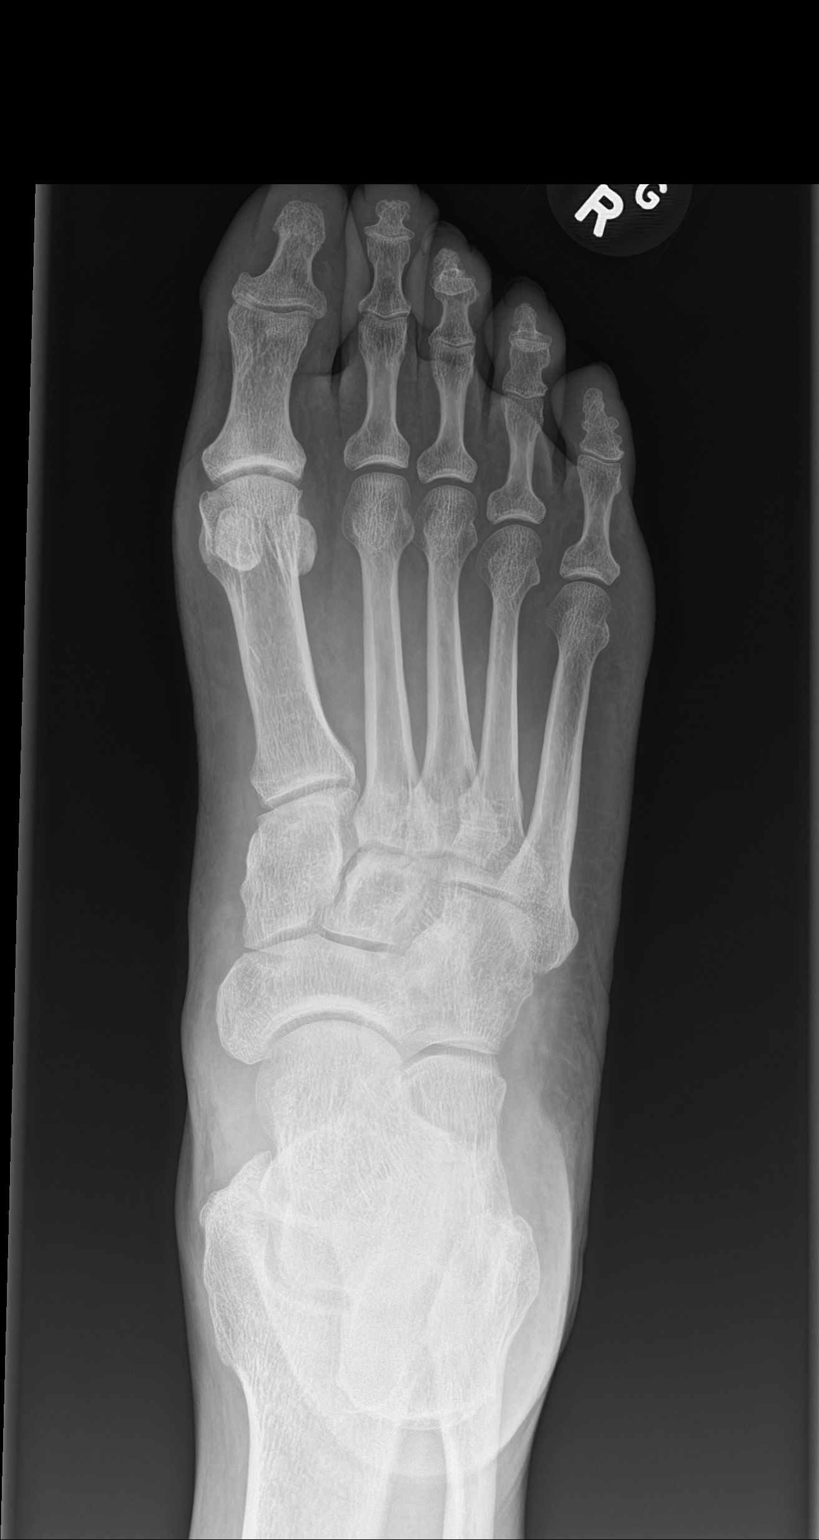

[foot obl]
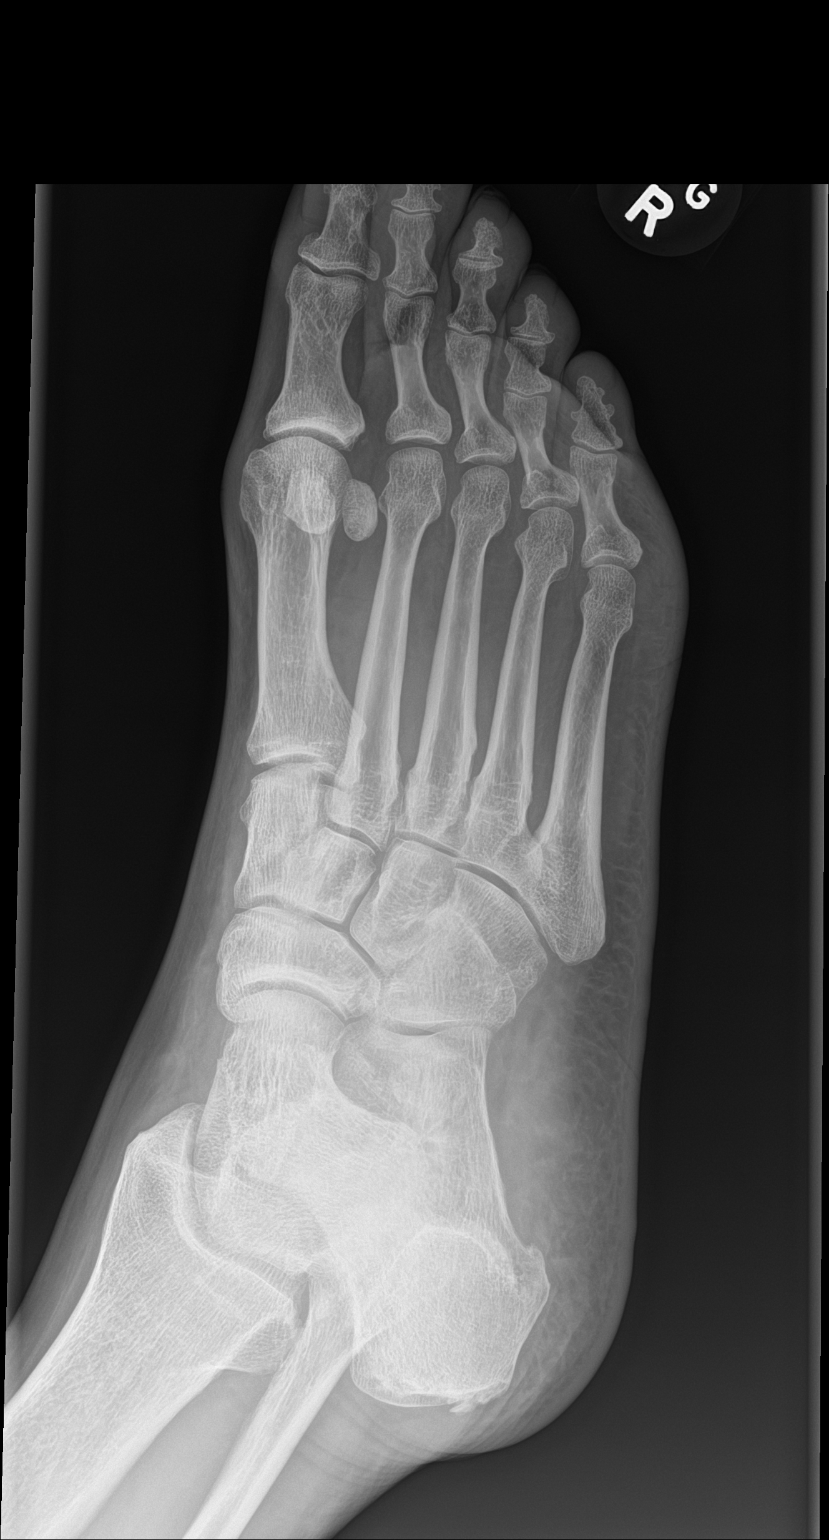

[foot lat]
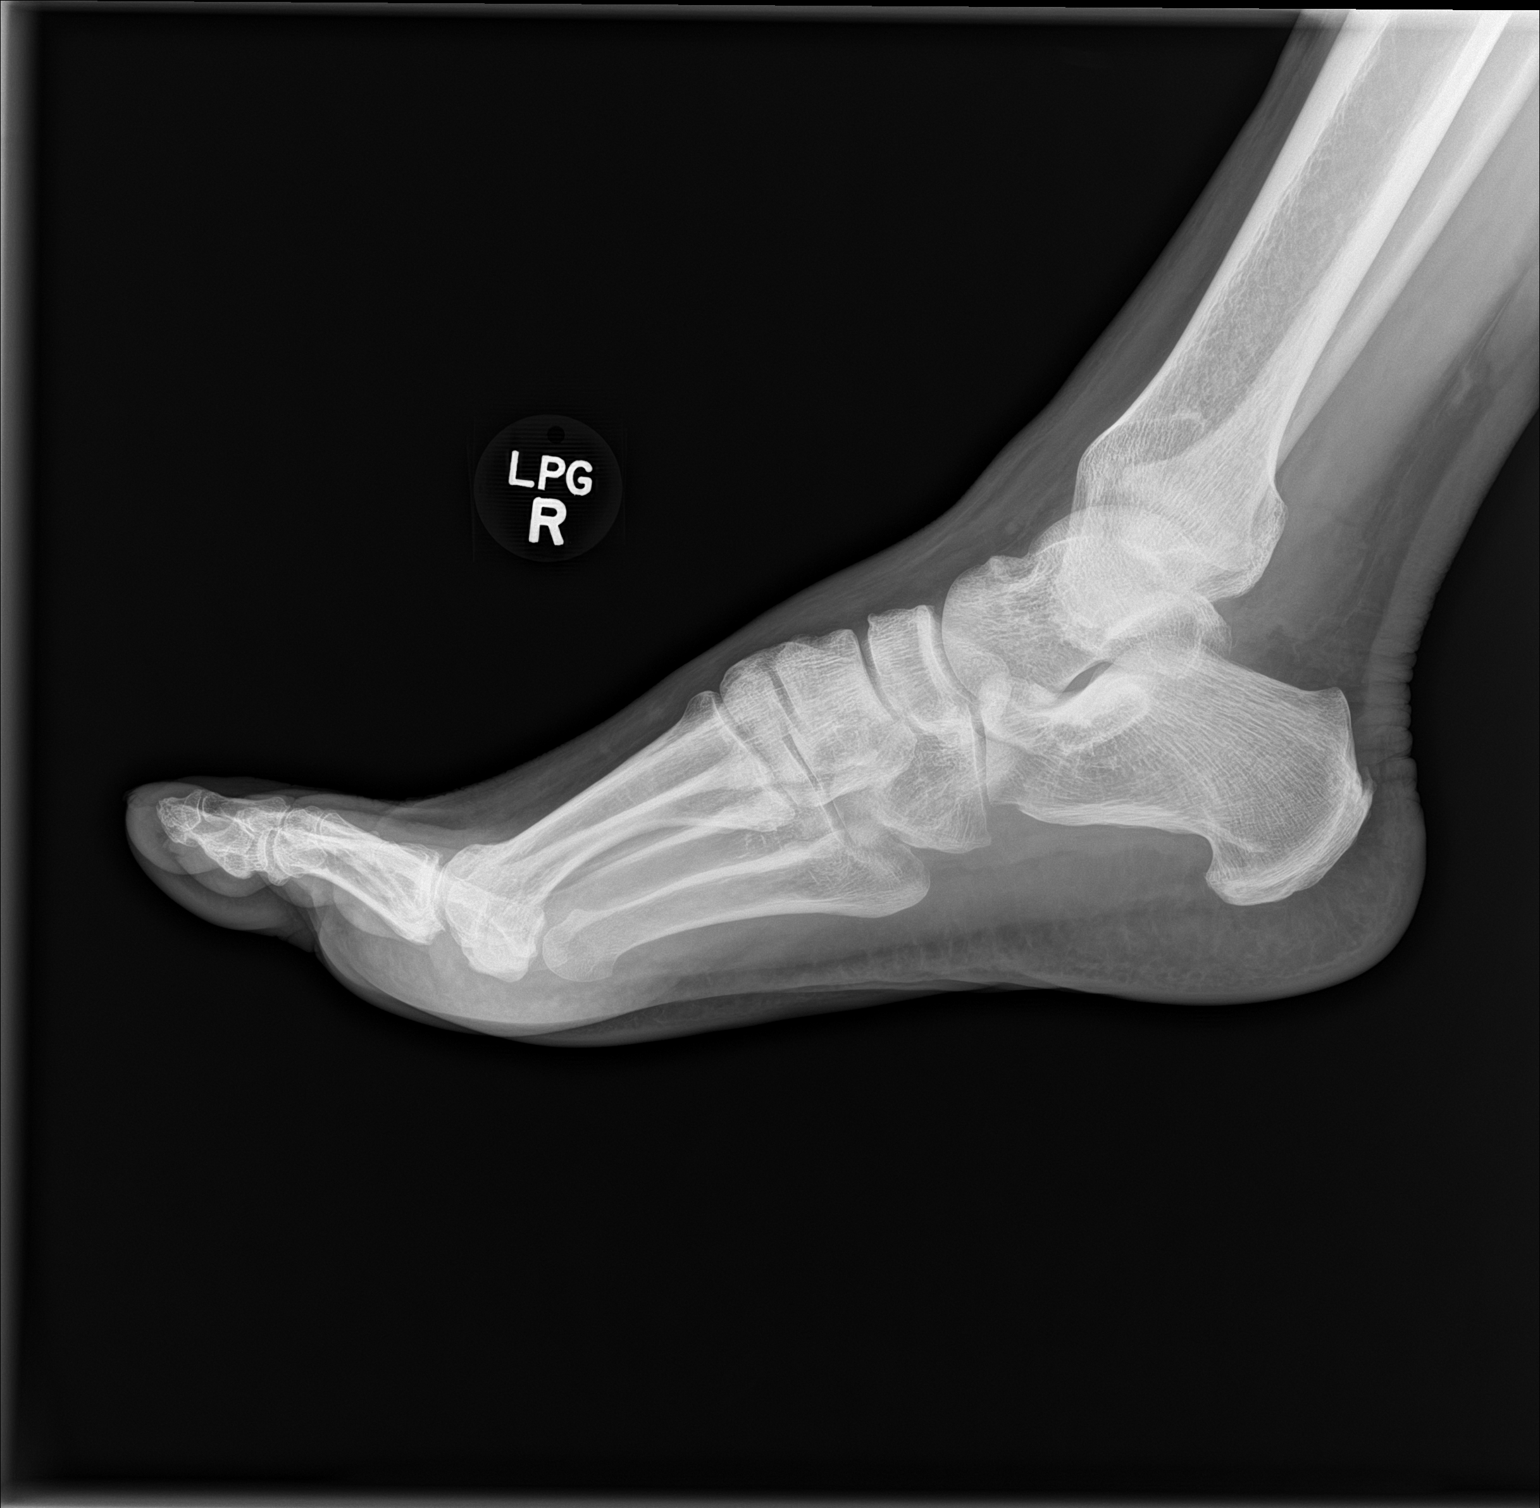

[3 of 3 positions shown; findings below may reference images not displayed]

FINDINGS: There is no evidence of fracture or dislocation. There is no
evidence of arthropathy or other focal bone abnormality. Soft
tissues are unremarkable.
IMPRESSION: Negative.

## 2022-04-08 ENCOUNTER — Ambulatory Visit: Payer: BC Managed Care – PPO | Admitting: Family Medicine

## 2022-04-29 ENCOUNTER — Ambulatory Visit (INDEPENDENT_AMBULATORY_CARE_PROVIDER_SITE_OTHER): Payer: BC Managed Care – PPO | Admitting: Family Medicine

## 2022-04-29 VITALS — BP 128/82 | HR 76 | Temp 98.0°F | Ht 74.0 in | Wt 238.8 lb

## 2022-04-29 DIAGNOSIS — R400 Somnolence: Secondary | ICD-10-CM | POA: Diagnosis not present

## 2022-04-29 DIAGNOSIS — R42 Dizziness and giddiness: Secondary | ICD-10-CM | POA: Diagnosis not present

## 2022-04-29 DIAGNOSIS — I10 Essential (primary) hypertension: Secondary | ICD-10-CM | POA: Diagnosis not present

## 2022-04-29 DIAGNOSIS — R519 Headache, unspecified: Secondary | ICD-10-CM

## 2022-04-29 DIAGNOSIS — G4700 Insomnia, unspecified: Secondary | ICD-10-CM

## 2022-04-29 NOTE — Patient Instructions (Addendum)
Try different blood pressure monitor and if still elevated readings schedule office visit with the meter to see if it is accurate. No change in meds for now.  I will see if we can help coordinate appointment with neuro sleep specialist as I do want them to evaluate your symptoms, but glad to hear things are better.  I still recommend follow up with stomach specialist - especially if return of prior difficulty with swallowing.  Continue same dose amlodipine for now. Goal of 48-64 ounces water per day.  Return to the clinic or go to the nearest emergency room if any of your symptoms worsen or new symptoms occur.  Melatonin if needed to help get to sleep - see other sleep info below. Sleep specialist visit as above.  Recheck in 13month.   Insomnia Insomnia is a sleep disorder that makes it difficult to fall asleep or stay asleep. Insomnia can cause fatigue, low energy, difficulty concentrating, mood swings, and poor performance at work or school. There are three different ways to classify insomnia: Difficulty falling asleep. Difficulty staying asleep. Waking up too early in the morning. Any type of insomnia can be long-term (chronic) or short-term (acute). Both are common. Short-term insomnia usually lasts for 3 months or less. Chronic insomnia occurs at least three times a week for longer than 3 months. What are the causes? Insomnia may be caused by another condition, situation, or substance, such as: Having certain mental health conditions, such as anxiety and depression. Using caffeine, alcohol, tobacco, or drugs. Having gastrointestinal conditions, such as gastroesophageal reflux disease (GERD). Having certain medical conditions. These include: Asthma. Alzheimer's disease. Stroke. Chronic pain. An overactive thyroid gland (hyperthyroidism). Other sleep disorders, such as restless legs syndrome and sleep apnea. Menopause. Sometimes, the cause of insomnia may not be known. What increases  the risk? Risk factors for insomnia include: Gender. Females are affected more often than males. Age. Insomnia is more common as people get older. Stress and certain medical and mental health conditions. Lack of exercise. Having an irregular work schedule. This may include working night shifts and traveling between different time zones. What are the signs or symptoms? If you have insomnia, the main symptom is having trouble falling asleep or having trouble staying asleep. This may lead to other symptoms, such as: Feeling tired or having low energy. Feeling nervous about going to sleep. Not feeling rested in the morning. Having trouble concentrating. Feeling irritable, anxious, or depressed. How is this diagnosed? This condition may be diagnosed based on: Your symptoms and medical history. Your health care provider may ask about: Your sleep habits. Any medical conditions you have. Your mental health. A physical exam. How is this treated? Treatment for insomnia depends on the cause. Treatment may focus on treating an underlying condition that is causing the insomnia. Treatment may also include: Medicines to help you sleep. Counseling or therapy. Lifestyle adjustments to help you sleep better. Follow these instructions at home: Eating and drinking  Limit or avoid alcohol, caffeinated beverages, and products that contain nicotine and tobacco, especially close to bedtime. These can disrupt your sleep. Do not eat a large meal or eat spicy foods right before bedtime. This can lead to digestive discomfort that can make it hard for you to sleep. Sleep habits  Keep a sleep diary to help you and your health care provider figure out what could be causing your insomnia. Write down: When you sleep. When you wake up during the night. How well you sleep and how  rested you feel the next day. Any side effects of medicines you are taking. What you eat and drink. Make your bedroom a dark,  comfortable place where it is easy to fall asleep. Put up shades or blackout curtains to block light from outside. Use a white noise machine to block noise. Keep the temperature cool. Limit screen use before bedtime. This includes: Not watching TV. Not using your smartphone, tablet, or computer. Stick to a routine that includes going to bed and waking up at the same times every day and night. This can help you fall asleep faster. Consider making a quiet activity, such as reading, part of your nighttime routine. Try to avoid taking naps during the day so that you sleep better at night. Get out of bed if you are still awake after 15 minutes of trying to sleep. Keep the lights down, but try reading or doing a quiet activity. When you feel sleepy, go back to bed. General instructions Take over-the-counter and prescription medicines only as told by your health care provider. Exercise regularly as told by your health care provider. However, avoid exercising in the hours right before bedtime. Use relaxation techniques to manage stress. Ask your health care provider to suggest some techniques that may work well for you. These may include: Breathing exercises. Routines to release muscle tension. Visualizing peaceful scenes. Make sure that you drive carefully. Do not drive if you feel very sleepy. Keep all follow-up visits. This is important. Contact a health care provider if: You are tired throughout the day. You have trouble in your daily routine due to sleepiness. You continue to have sleep problems, or your sleep problems get worse. Get help right away if: You have thoughts about hurting yourself or someone else. Get help right away if you feel like you may hurt yourself or others, or have thoughts about taking your own life. Go to your nearest emergency room or: Call 911. Call the [facility] at [phone] or 988. This is open 24 hours a day. Text the Crisis Text  Line at 431-467-0429. Summary Insomnia is a sleep disorder that makes it difficult to fall asleep or stay asleep. Insomnia can be long-term (chronic) or short-term (acute). Treatment for insomnia depends on the cause. Treatment may focus on treating an underlying condition that is causing the insomnia. Keep a sleep diary to help you and your health care provider figure out what could be causing your insomnia. This information is not intended to replace advice given to you by your health care provider. Make sure you discuss any questions you have with your health care provider. Document Revised: 03/15/2021 Document Reviewed: 03/15/2021 Elsevier Patient Education  [place].

## 2022-04-29 NOTE — Progress Notes (Signed)
Subjective:  Patient ID: Micheal Roberts, male    DOB: April 30, 1970  Age: 52 y.o. MRN: [id number]  CC:  Chief Complaint  Patient presents with   Hypertension    Pt notes dizziness seems to have subsided SOME since starting med but does still happen on occasion,     HPI Micheal Roberts presents for   Hypertension: Last seen in November, episodic lightheadedness at that time off and on for 6 months.  Episodic chest symptoms possible achalasia/dysphagia, separate times from dizziness/numbness.  Recommended follow-up with GI to discuss those symptoms, no acute findings on EKG, nonfocal neuroexam at that time.,  Reassuring labs.  Increased hydration recommended.  Repeat visit with some intermittent lightheadedness but was increasing his fluid intake at that time.  No chest symptoms or palpitations.  Was referred to neurology given episodic headache along with dizziness.  Imaging initially deferred without focal neurologic symptoms on exam.  Follow-up with sleep specialist planned. Started on low-dose amlodipine 2.5 mg daily for hypertension.  Still occasional dizziness, but not as frequent. Only if standing up quickly, not every time. Drinking 3 bottles water per day at least. Headaches have lessened, still some mild HA at times, but better. Some days sleepy during the day. Trouble getting to sleep at times. No meds. No mind racing, depression or anxiety.  Taking amlodipine daily - feeling better as above.  Home readings - variable - up to 186/90, 154/94 - not sure it is accurate.  Has not heard back from sleep specialist/neuro - few calls recently, and has not heard back.  Has not seen GI yet for prior stomach issues, prior symptoms improved. No recent feeling of food getting stuck.   BP Readings from Last 3 Encounters:  04/29/22 128/82  03/09/22 (!) 150/100  02/24/22 130/70   Lab Results  Component Value Date   CREATININE 1.22 02/24/2022    History Patient Active Problem List    Diagnosis Date Noted   IBS (irritable bowel syndrome)    Past Medical History:  Diagnosis Date   Anxiety    IBS (irritable bowel syndrome)    Past Surgical History:  Procedure Laterality Date   left ACL arthroscopy     Allergies  Allergen Reactions   Benzonatate Anaphylaxis   Prior to Admission medications   Medication Sig Start Date End Date Taking? Authorizing Provider  amLODipine (NORVASC) 2.5 MG tablet TAKE 1 TABLET(2.5 MG) BY MOUTH DAILY 03/09/22  Yes Wendie Agreste, MD  amLODipine (NORVASC) 2.5 MG tablet Take 1 tablet (2.5 mg total) by mouth daily. 03/09/22  Yes Wendie Agreste, MD  hydrOXYzine (ATARAX) 10 MG tablet Take 1 tablet (10 mg total) by mouth 3 (three) times daily as needed. 02/24/22  Yes Wendie Agreste, MD  omeprazole (PRILOSEC) 40 MG capsule Take 1 capsule (40 mg total) by mouth daily. 05/24/21  Yes Milus Banister, MD   Social History   Socioeconomic History   Marital status: Married    Spouse name: Not on file   Number of children: Not on file   Years of education: Not on file   Highest education level: Not on file  Occupational History   Not on file  Tobacco Use   Smoking status: Former    Packs/day: 0.50    Years: 10.00    Total pack years: 5.00    Types: Cigarettes    Quit date: 2018    Years since quitting: 6.0   Smokeless tobacco: Never  Vaping Use  Vaping Use: Never used  Substance and Sexual Activity   Alcohol use: Yes    Alcohol/week: 0.0 standard drinks of alcohol    Comment: beer - only on ocassions   Drug use: No   Sexual activity: Yes  Other Topics Concern   Not on file  Social History Narrative   Caffeine: 1-2 cup daily.   Education:  one yr college.  (Did not finish).   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     Social Determinants of Health   Financial Resource Strain: Not on file  Food Insecurity: Not on file  Transportation Needs: Not on file  Physical Activity: Not on file  Stress: Not on file  Social Connections: Not on  file  Intimate Partner Violence: Not on file    Review of Systems Per HPI.   Objective:   Vitals:   04/29/22 1119  BP: 128/82  Pulse: 76  Temp: 98 F (36.7 C)  TempSrc: Oral  SpO2: 94%  Weight: 238 lb 12.8 oz (108.3 kg)  Height: '6\' 2"'$  (1.88 m)     Physical Exam Vitals reviewed.  Constitutional:      Appearance: He is well-developed.  HENT:     Head: Normocephalic and atraumatic.  Neck:     Vascular: No carotid bruit or JVD.  Cardiovascular:     Rate and Rhythm: Normal rate and regular rhythm.     Heart sounds: Normal heart sounds. No murmur heard. Pulmonary:     Effort: Pulmonary effort is normal.     Breath sounds: Normal breath sounds. No rales.  Musculoskeletal:     Right lower leg: No edema.     Left lower leg: No edema.  Skin:    General: Skin is warm and dry.  Neurological:     General: No focal deficit present.     Mental Status: He is alert and oriented to person, place, and time. Mental status is at baseline.     Cranial Nerves: No cranial nerve deficit.     Sensory: No sensory deficit.     Motor: No weakness.     Coordination: Coordination normal.     Gait: Gait normal.  Psychiatric:        Mood and Affect: Mood normal.     Assessment & Plan:  Micheal Roberts is a 52 y.o. male . Hypertension, essential  Occipital headache  Lightheadedness  Daytime somnolence  Insomnia, unspecified type  Overall previous symptoms have improved including headaches, intermittent lightheadedness.  Also improved eating without recent dysphagia/chest symptoms as discussed previously.  Blood pressure improved control on amlodipine without apparent new side effects.  Still some intermittent lightheadedness.  Question orthostatic symptoms but based on blood pressure control today, avoid changes.  Continue amlodipine same dose.  Did recommend he still follow back up with gastroenterology when able, especially if any recurrence of previous swallowing issues.  Continue  PPI, maintenance of hydration, and also follow-up with sleep specialist and neuro as planned.  Some difficulty obtaining that appointment.  I will see if our referral staff can try to assist. Follow-up if persistent elevated blood pressure readings with meter as unlikely accurate based on today's readings.  ER/RTC precautions.  No orders of the defined types were placed in this encounter.  Patient Instructions  Try different blood pressure monitor and if still elevated readings schedule office visit with the meter to see if it is accurate. No change in meds for now.  I will see if we can help coordinate appointment with neuro  sleep specialist as I do want them to evaluate your symptoms, but glad to hear things are better.  I still recommend follow up with stomach specialist - especially if return of prior difficulty with swallowing.  Continue same dose amlodipine for now. Goal of 48-64 ounces water per day.  Return to the clinic or go to the nearest emergency room if any of your symptoms worsen or new symptoms occur.  Melatonin if needed to help get to sleep - see other sleep info below. Sleep specialist visit as above.  Recheck in 23month.   Insomnia Insomnia is a sleep disorder that makes it difficult to fall asleep or stay asleep. Insomnia can cause fatigue, low energy, difficulty concentrating, mood swings, and poor performance at work or school. There are three different ways to classify insomnia: Difficulty falling asleep. Difficulty staying asleep. Waking up too early in the morning. Any type of insomnia can be long-term (chronic) or short-term (acute). Both are common. Short-term insomnia usually lasts for 3 months or less. Chronic insomnia occurs at least three times a week for longer than 3 months. What are the causes? Insomnia may be caused by another condition, situation, or substance, such as: Having certain mental health conditions, such as anxiety and depression. Using  caffeine, alcohol, tobacco, or drugs. Having gastrointestinal conditions, such as gastroesophageal reflux disease (GERD). Having certain medical conditions. These include: Asthma. Alzheimer's disease. Stroke. Chronic pain. An overactive thyroid gland (hyperthyroidism). Other sleep disorders, such as restless legs syndrome and sleep apnea. Menopause. Sometimes, the cause of insomnia may not be known. What increases the risk? Risk factors for insomnia include: Gender. Females are affected more often than males. Age. Insomnia is more common as people get older. Stress and certain medical and mental health conditions. Lack of exercise. Having an irregular work schedule. This may include working night shifts and traveling between different time zones. What are the signs or symptoms? If you have insomnia, the main symptom is having trouble falling asleep or having trouble staying asleep. This may lead to other symptoms, such as: Feeling tired or having low energy. Feeling nervous about going to sleep. Not feeling rested in the morning. Having trouble concentrating. Feeling irritable, anxious, or depressed. How is this diagnosed? This condition may be diagnosed based on: Your symptoms and medical history. Your health care provider may ask about: Your sleep habits. Any medical conditions you have. Your mental health. A physical exam. How is this treated? Treatment for insomnia depends on the cause. Treatment may focus on treating an underlying condition that is causing the insomnia. Treatment may also include: Medicines to help you sleep. Counseling or therapy. Lifestyle adjustments to help you sleep better. Follow these instructions at home: Eating and drinking  Limit or avoid alcohol, caffeinated beverages, and products that contain nicotine and tobacco, especially close to bedtime. These can disrupt your sleep. Do not eat a large meal or eat spicy foods right before bedtime. This  can lead to digestive discomfort that can make it hard for you to sleep. Sleep habits  Keep a sleep diary to help you and your health care provider figure out what could be causing your insomnia. Write down: When you sleep. When you wake up during the night. How well you sleep and how rested you feel the next day. Any side effects of medicines you are taking. What you eat and drink. Make your bedroom a dark, comfortable place where it is easy to fall asleep. Put up shades or blackout curtains  to block light from outside. Use a white noise machine to block noise. Keep the temperature cool. Limit screen use before bedtime. This includes: Not watching TV. Not using your smartphone, tablet, or computer. Stick to a routine that includes going to bed and waking up at the same times every day and night. This can help you fall asleep faster. Consider making a quiet activity, such as reading, part of your nighttime routine. Try to avoid taking naps during the day so that you sleep better at night. Get out of bed if you are still awake after 15 minutes of trying to sleep. Keep the lights down, but try reading or doing a quiet activity. When you feel sleepy, go back to bed. General instructions Take over-the-counter and prescription medicines only as told by your health care provider. Exercise regularly as told by your health care provider. However, avoid exercising in the hours right before bedtime. Use relaxation techniques to manage stress. Ask your health care provider to suggest some techniques that may work well for you. These may include: Breathing exercises. Routines to release muscle tension. Visualizing peaceful scenes. Make sure that you drive carefully. Do not drive if you feel very sleepy. Keep all follow-up visits. This is important. Contact a health care provider if: You are tired throughout the day. You have trouble in your daily routine due to sleepiness. You continue to have  sleep problems, or your sleep problems get worse. Get help right away if: You have thoughts about hurting yourself or someone else. Get help right away if you feel like you may hurt yourself or others, or have thoughts about taking your own life. Go to your nearest emergency room or: Call 911. Call the [facility] at [phone] or 988. This is open 24 hours a day. Text the Crisis Text Line at 820 026 1713. Summary Insomnia is a sleep disorder that makes it difficult to fall asleep or stay asleep. Insomnia can be long-term (chronic) or short-term (acute). Treatment for insomnia depends on the cause. Treatment may focus on treating an underlying condition that is causing the insomnia. Keep a sleep diary to help you and your health care provider figure out what could be causing your insomnia. This information is not intended to replace advice given to you by your health care provider. Make sure you discuss any questions you have with your health care provider. Document Revised: 03/15/2021 Document Reviewed: 03/15/2021 Elsevier Patient Education  2023 [place],   Merri Ray, MD [place] 04/29/22 12:16 PM

## 2022-06-06 ENCOUNTER — Telehealth: Payer: Self-pay | Admitting: Neurology

## 2022-06-06 ENCOUNTER — Encounter: Payer: Self-pay | Admitting: Neurology

## 2022-06-06 ENCOUNTER — Ambulatory Visit: Payer: BC Managed Care – PPO | Admitting: Neurology

## 2022-06-06 NOTE — Telephone Encounter (Signed)
Patient no showed for a new problem visit today, he also no showed for a sleep study appointment in June 2023 after canceling 3 times.

## 2022-06-09 ENCOUNTER — Ambulatory Visit: Payer: BC Managed Care – PPO | Admitting: Neurology

## 2022-06-22 ENCOUNTER — Encounter: Payer: Self-pay | Admitting: Family Medicine

## 2022-06-22 ENCOUNTER — Ambulatory Visit: Payer: BC Managed Care – PPO | Admitting: Family Medicine

## 2022-06-22 VITALS — BP 128/92 | HR 98 | Ht 74.0 in | Wt 234.2 lb

## 2022-06-22 DIAGNOSIS — Z9189 Other specified personal risk factors, not elsewhere classified: Secondary | ICD-10-CM | POA: Diagnosis not present

## 2022-06-22 DIAGNOSIS — N410 Acute prostatitis: Secondary | ICD-10-CM | POA: Diagnosis not present

## 2022-06-22 DIAGNOSIS — R300 Dysuria: Secondary | ICD-10-CM

## 2022-06-22 LAB — POCT URINALYSIS DIPSTICK
Bilirubin, UA: NEGATIVE
Blood, UA: NEGATIVE
Glucose, UA: NEGATIVE
Ketones, UA: NEGATIVE
Leukocytes, UA: NEGATIVE
Nitrite, UA: NEGATIVE
Protein, UA: NEGATIVE
Spec Grav, UA: <=1.005 — AB (ref 1.010–1.025)
Urobilinogen, UA: 0.2 E.U./dL
pH, UA: 6 (ref 5.0–8.0)

## 2022-06-22 MED ORDER — CIPROFLOXACIN HCL 500 MG PO TABS: 500.0000 mg | ORAL_TABLET | Freq: Two times a day (BID) | ORAL | 0 refills | Status: AC

## 2022-06-22 MED ORDER — ONDANSETRON HCL 4 MG PO TABS: 4.0000 mg | ORAL_TABLET | Freq: Three times a day (TID) | ORAL | 0 refills | Status: DC | PRN

## 2022-06-22 NOTE — Progress Notes (Signed)
Acute Office Visit   Subjective:  Patient ID: Micheal Roberts, male    DOB: 1970-06-30, 52 y.o.   MRN: [id number]  Chief Complaint  Patient presents with   Urinary Tract Infection    Pt is here today with C/O of Urination issues started 06/22/2022 Pain in lower back.(Dull) Pt states he has been "dribbling" with urination.   HPI Patient is a 52 year old [place] man who presents with urinary complaints. While at work he had normal urgency to urinate, but it was very hard to urinate/produce a stream. He reports it started out as dribbling, gradually had a good stream of urine. He reports he has intermittent dull lumbar pain that started today. He reports his lumbar pain improves once he urinates.   Denies abd pain, nausea, vomiting, diarrhea, or constipation. Denies fever.   ROS See HPI above    Objective:    BP (!) 128/92   Pulse 98   Ht '6\' 2"'$  (1.88 m)   Wt 234 lb 4 oz (106.3 kg)   SpO2 98%   BMI 30.08 kg/m    Physical Exam Vitals reviewed.  Constitutional:      General: He is not in acute distress.    Appearance: Normal appearance. He is not ill-appearing or toxic-appearing.  HENT:     Head: Normocephalic and atraumatic.     Right Ear: Tympanic membrane, ear canal and external ear normal. There is no impacted cerumen.     Left Ear: Tympanic membrane, ear canal and external ear normal. There is no impacted cerumen.     Mouth/Throat:     Mouth: Mucous membranes are moist.     Pharynx: Oropharynx is clear. No oropharyngeal exudate or posterior oropharyngeal erythema.  Eyes:     General:        Right eye: No discharge.        Left eye: No discharge.     Conjunctiva/sclera: Conjunctivae normal.     Pupils: Pupils are equal, round, and reactive to light.  Neck:     Thyroid: No thyromegaly.  Cardiovascular:     Rate and Rhythm: Normal rate and regular rhythm.     Heart sounds: Normal heart sounds. No murmur heard.    No friction rub. No gallop.  Pulmonary:      Effort: Pulmonary effort is normal. No respiratory distress.     Breath sounds: Normal breath sounds.  Abdominal:     General: Abdomen is flat. Bowel sounds are normal.     Palpations: Abdomen is soft.     Tenderness: There is no abdominal tenderness. There is no right CVA tenderness or left CVA tenderness.  Musculoskeletal:        General: Normal range of motion.     Cervical back: Normal range of motion.     Lumbar back: No tenderness.  Lymphadenopathy:     Cervical: No cervical adenopathy.  Skin:    General: Skin is warm and dry.  Neurological:     General: No focal deficit present.     Mental Status: He is alert and oriented to person, place, and time. Mental status is at baseline.  Psychiatric:        Mood and Affect: Mood normal.        Behavior: Behavior normal.        Thought Content: Thought content normal.        Judgment: Judgment normal.    Results for orders placed or performed in visit on 06/22/22  POCT urinalysis dipstick  Result Value Ref Range   Color, UA clear    Clarity, UA     Glucose, UA Negative Negative   Bilirubin, UA neg    Ketones, UA neg    Spec Grav, UA <=1.005 (A) 1.010 - 1.025   Blood, UA neg    pH, UA 6.0 5.0 - 8.0   Protein, UA Negative Negative   Urobilinogen, UA 0.2 0.2 or 1.0 E.U./dL   Nitrite, UA neg    Leukocytes, UA Negative Negative   Appearance     Odor       Assessment & Plan:  Acute prostatitis Assessment & Plan: Urinalysis negative for UTI. Prescribed Cipro '500mg'$  1 tablet twice daily for 10 days. Discussed about the risk of C-diff, tendon rupture, tendonitis, and peripheral neuropathy. If symptoms occur, stop medication immediately, either call the office or go to the closes emergency department.     Orders: -     PSA -     CBC with Differential/Platelet -     Ciprofloxacin HCl; Take 1 tablet (500 mg total) by mouth 2 (two) times daily for 10 days.  Dispense: 20 tablet; Refill: 0  Dysuria -     POCT urinalysis  dipstick -     Urine Culture  At risk for nausea and vomiting -     Ondansetron HCl; Take 1 tablet (4 mg total) by mouth every 8 (eight) hours as needed for nausea or vomiting.  Dispense: 20 tablet; Refill: 0  -POCT urinalysis is negative for UTI. Will send urine for culture and change antibiotic if needed.  -Prescribed Zofran for possible nausea and vomiting with taking antibiotic.  -Follow up if not improved with either Dr. Carlota Raspberry or this provider, or symptoms become worse.   Micheal Merino, NP

## 2022-06-22 NOTE — Patient Instructions (Addendum)
-   Urine is normal. Will send for urine culture.  -Ordered CBC and PSA. Office will call with results and you may see your results in MyChart.  -Prescribed Cipro '500mg'$  1 tablet twice daily for 10 days. Discussed about the risk of C-diff, tendon rupture, tendonitis, and peripheral neuropathy. If symptoms occur, stop medication immediately, either call the office or go to the closes emergency.  -Follow up if not improved or symptoms become worse.  -Zofran '4mg'$  every 8 hrs as needed for nausea and vomiting.

## 2022-06-22 NOTE — Assessment & Plan Note (Signed)
Urinalysis negative for UTI. Prescribed Cipro '500mg'$  1 tablet twice daily for 10 days. Discussed about the risk of C-diff, tendon rupture, tendonitis, and peripheral neuropathy. If symptoms occur, stop medication immediately, either call the office or go to the closes emergency department.

## 2022-06-23 LAB — CBC WITH DIFFERENTIAL/PLATELET
Basophils Absolute: 0.1 10*3/uL (ref 0.0–0.1)
Basophils Relative: 1.2 % (ref 0.0–3.0)
Eosinophils Absolute: 0.2 10*3/uL (ref 0.0–0.7)
Eosinophils Relative: 3 % (ref 0.0–5.0)
HCT: 40.6 % (ref 39.0–52.0)
Hemoglobin: 13.5 g/dL (ref 13.0–17.0)
Lymphocytes Relative: 30.1 % (ref 12.0–46.0)
Lymphs Abs: 1.7 10*3/uL (ref 0.7–4.0)
MCHC: 33.2 g/dL (ref 30.0–36.0)
MCV: 88.5 fl (ref 78.0–100.0)
Monocytes Absolute: 0.3 10*3/uL (ref 0.1–1.0)
Monocytes Relative: 6.2 % (ref 3.0–12.0)
Neutro Abs: 3.3 10*3/uL (ref 1.4–7.7)
Neutrophils Relative %: 59.5 % (ref 43.0–77.0)
Platelets: 268 10*3/uL (ref 150.0–400.0)
RBC: 4.58 Mil/uL (ref 4.22–5.81)
RDW: 13.5 % (ref 11.5–15.5)
WBC: 5.6 10*3/uL (ref 4.0–10.5)

## 2022-06-23 LAB — PSA: PSA: 0.14 ng/mL (ref 0.10–4.00)

## 2022-06-24 LAB — URINE CULTURE
MICRO NUMBER:: 14657694
Result:: NO GROWTH
SPECIMEN QUALITY:: ADEQUATE

## 2022-07-04 ENCOUNTER — Other Ambulatory Visit: Payer: Self-pay | Admitting: Gastroenterology

## 2022-07-04 NOTE — Telephone Encounter (Signed)
This is a patient of Dr Ardis Hughs.  Please advise refills as you are DOD am.

## 2022-07-07 ENCOUNTER — Telehealth: Payer: Self-pay

## 2022-07-07 NOTE — Telephone Encounter (Signed)
[facility] was signed and faxed.

## 2022-08-01 ENCOUNTER — Ambulatory Visit: Payer: BC Managed Care – PPO | Admitting: Family Medicine

## 2022-08-03 ENCOUNTER — Ambulatory Visit: Payer: BC Managed Care – PPO | Admitting: Family Medicine

## 2022-08-03 VITALS — BP 134/80 | HR 74 | Temp 98.0°F | Ht 74.0 in | Wt 232.2 lb

## 2022-08-03 DIAGNOSIS — G4700 Insomnia, unspecified: Secondary | ICD-10-CM

## 2022-08-03 DIAGNOSIS — Z13220 Encounter for screening for lipoid disorders: Secondary | ICD-10-CM

## 2022-08-03 DIAGNOSIS — I10 Essential (primary) hypertension: Secondary | ICD-10-CM

## 2022-08-03 DIAGNOSIS — R400 Somnolence: Secondary | ICD-10-CM

## 2022-08-03 DIAGNOSIS — R350 Frequency of micturition: Secondary | ICD-10-CM | POA: Diagnosis not present

## 2022-08-03 LAB — LIPID PANEL
Cholesterol: 163 mg/dL (ref 0–200)
HDL: 32.4 mg/dL — ABNORMAL LOW (ref >39.00)
LDL Cholesterol: 101 mg/dL — ABNORMAL HIGH (ref 0–99)
NonHDL: 130.33
Total CHOL/HDL Ratio: 5
Triglycerides: 146 mg/dL (ref 0.0–149.0)
VLDL: 29.2 mg/dL (ref 0.0–40.0)

## 2022-08-03 LAB — POCT URINALYSIS DIPSTICK
Bilirubin, UA: NEGATIVE
Blood, UA: NEGATIVE
Glucose, UA: NEGATIVE
Ketones, UA: NEGATIVE
Leukocytes, UA: NEGATIVE
Nitrite, UA: NEGATIVE
Protein, UA: NEGATIVE
Spec Grav, UA: 1.015 (ref 1.010–1.025)
Urobilinogen, UA: 0.2 E.U./dL
pH, UA: 6 (ref 5.0–8.0)

## 2022-08-03 LAB — COMPREHENSIVE METABOLIC PANEL
ALT: 75 U/L — ABNORMAL HIGH (ref 0–53)
AST: 36 U/L (ref 0–37)
Albumin: 4.6 g/dL (ref 3.5–5.2)
Alkaline Phosphatase: 78 U/L (ref 39–117)
BUN: 8 mg/dL (ref 6–23)
CO2: 32 mEq/L (ref 19–32)
Calcium: 9.4 mg/dL (ref 8.4–10.5)
Chloride: 100 mEq/L (ref 96–112)
Creatinine, Ser: 1.2 mg/dL (ref 0.40–1.50)
GFR: 69.86 mL/min (ref >60.00)
Glucose, Bld: 91 mg/dL (ref 70–99)
Potassium: 3.8 mEq/L (ref 3.5–5.1)
Sodium: 141 mEq/L (ref 135–145)
Total Bilirubin: 0.6 mg/dL (ref 0.2–1.2)
Total Protein: 7.8 g/dL (ref 6.0–8.3)

## 2022-08-03 NOTE — Patient Instructions (Addendum)
You can try taking 2 of the 2.5mg  amlodipine and see if that is tolerated and better blood pressures. Let me know as I can send in the  dose if that is tolerated.  I will refer you elsewhere for possible sleep apnea testing.   See info on frequent urination. Certain foods/fluids may be the issue. Unlikely infection at this time, but return if any worsening or new symptoms.   Return to the clinic or go to the nearest emergency room if any of your symptoms worsen or new symptoms occur.   Urinary Frequency, Adult Urinary frequency means urinating more often than usual. You may urinate every 1-2 hours even though you drink a normal amount of fluid and do not have a bladder infection or condition. Although you urinate more often than normal, the total amount of urine produced in a day is normal. With urinary frequency, you may have an urgent need to urinate often. The stress and anxiety of needing to find a bathroom quickly can make this urge worse. This condition may go away on its own, or you may need treatment at home. Home treatment may include bladder training, exercises, taking medicines, or making changes to your diet. Follow these instructions at home: Bladder health Your health care provider will tell you what to do to improve bladder health. You may be told to: Keep a bladder diary. Keep track of: What you eat and drink. How often you urinate. How much you urinate. Follow a bladder training program. This may include: Learning to delay going to the bathroom. Double urinating, also called voiding. This helps if you are not completely emptying your bladder. Scheduled voiding. Do Kegel exercises. Kegel exercises strengthen the muscles that help control urination, which may help the condition.  Eating and drinking Follow instructions from your health care provider about eating or drinking restrictions. You may be told to: Avoid caffeine. Drink fewer fluids, especially alcohol. Avoid  drinking in the evening. Avoid foods or drinks that may irritate the bladder. These include coffee, tea, soda, artificial sweeteners, citrus, tomato-based foods, and chocolate. Eat foods that help prevent or treat constipation. Constipation can make urinary frequency worse. You may need to take these actions to prevent or treat constipation: Drink enough fluid to keep your urine pale yellow. Take over-the-counter or prescription medicines. Eat foods that are high in fiber, such as beans, whole grains, and fresh fruits and vegetables. Limit foods that are high in fat and processed sugars, such as fried or sweet foods. General instructions Take over-the-counter and prescription medicines only as told by your health care provider. Keep all follow-up visits. This is important. Contact a health care provider if: You start urinating more often. You feel pain or irritation when you urinate. You notice blood in your urine. Your urine looks cloudy. You develop a fever. You begin vomiting. Get help right away if: You are unable to urinate. Summary Urinary frequency means urinating more often than usual. With urinary frequency, you may urinate every 1-2 hours even though you drink a normal amount of fluid and do not have a bladder infection or other bladder condition. Your health care provider may recommend that you keep a bladder diary, follow a bladder training program, or make dietary changes. If told by your health care provider, do Kegel exercises to strengthen the muscles that help control urination. Take over-the-counter and prescription medicines only as told by your health care provider. Contact a health care provider if your symptoms do not improve or  get worse. This information is not intended to replace advice given to you by your health care provider. Make sure you discuss any questions you have with your health care provider. Document Revised: 11/08/2019 Document Reviewed:  11/08/2019 Elsevier Patient Education  2023 [employer].

## 2022-08-03 NOTE — Progress Notes (Signed)
Subjective:  Patient ID: Micheal Roberts, male    DOB: [birth year]  Age: 52 y.o. MRN: [id number]  CC:  Chief Complaint  Patient presents with   Hypertension   Prostatitis    Pt had episode last month and wants to follow up on that issue, notes some pain has come and gone, PSA was normal     HPI Micheal Roberts presents for   Hypertension: Last discussed in January.  Dizziness and improved.  Increase water intake, less headaches.  Has been taking amlodipine 2.5 mg a day at that time, still elevated readings.  Plan for neuro and sleep specialist follow-up at that time.  Also some question about accuracy of home meter. He was no-show for his visit February 2024 with sleep specialist, and no showed for sleep study appointment in June of last year. Had to go to [place] - mother was sick. He was dismissed form Production assistant, radio.  Still taking amlodipine 2.5 mg daily, no new side effects.  Home readings: 170/90, not sure machine is effective.  Still waking up during night. Daytime sleepiness at times. Still snoring.  No new symptoms. No CP, dizziness is better. Fasting today.   BP Readings from Last 3 Encounters:  08/03/22 134/80  06/22/22 (!) 128/92  04/29/22 128/82   Lab Results  Component Value Date   CREATININE 1.22 02/24/2022   Prostatitis Evaluated March 6 by my colleague. Urinary urgency, some hesitancy, dribbling.  In office u/a looked ok. Treated with Cipro 500 mg twice daily for 10 days. Urine cx no growth. Normal CBC, and PSA was normal at 0.14.  Completed antibiotic.  Improved. Went on a cruise.  Able to urinate, but some urinary frequency at times - intermittent. No hematuria, no dysuria. No retention/hesitancy.  Drinking carbonated beverages.  No new sexual contacts or d/c. Monogamous with wife.   History Patient Active Problem List   Diagnosis Date Noted   Acute prostatitis 06/22/2022   IBS (irritable bowel syndrome)    Past Medical History:  Diagnosis Date   Anxiety     IBS (irritable bowel syndrome)    Past Surgical History:  Procedure Laterality Date   left ACL arthroscopy     Allergies  Allergen Reactions   Benzonatate Anaphylaxis   Prior to Admission medications   Medication Sig Start Date End Date Taking? Authorizing Provider  amLODipine (NORVASC) 2.5 MG tablet TAKE 1 TABLET(2.5 MG) BY MOUTH DAILY 03/09/22  Yes Shade Flood, MD  amLODipine (NORVASC) 2.5 MG tablet Take 1 tablet (2.5 mg total) by mouth daily. 03/09/22  Yes Shade Flood, MD  hydrOXYzine (ATARAX) 10 MG tablet Take 1 tablet (10 mg total) by mouth 3 (three) times daily as needed. 02/24/22  Yes Shade Flood, MD  omeprazole (PRILOSEC) 40 MG capsule TAKE 1 CAPSULE(40 MG) BY MOUTH DAILY 07/04/22  Yes Meryl Dare, MD  ondansetron (ZOFRAN) 4 MG tablet Take 1 tablet (4 mg total) by mouth every 8 (eight) hours as needed for nausea or vomiting. 06/22/22  Yes Alveria Apley, NP   Social History   Socioeconomic History   Marital status: Married    Spouse name: Not on file   Number of children: Not on file   Years of education: Not on file   Highest education level: Not on file  Occupational History   Not on file  Tobacco Use   Smoking status: Former    Packs/day: 0.50    Years: 10.00    Additional pack years:  0.00    Total pack years: 5.00    Types: Cigarettes    Quit date: 2018    Years since quitting: 6.2   Smokeless tobacco: Never  Vaping Use   Vaping Use: Never used  Substance and Sexual Activity   Alcohol use: Yes    Alcohol/week: 0.0 standard drinks of alcohol    Comment: beer - only on ocassions   Drug use: No   Sexual activity: Yes  Other Topics Concern   Not on file  Social History Narrative   Caffeine: 1-2 cup daily.   Education:  one yr college.  (Did not finish).   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     Social Determinants of Health   Financial Resource Strain: Not on file  Food Insecurity: Not on file  Transportation Needs: Not on file   Physical Activity: Not on file  Stress: Not on file  Social Connections: Not on file  Intimate Partner Violence: Not on file    Review of Systems  Constitutional:  Negative for fatigue and unexpected weight change.  Eyes:  Negative for visual disturbance.  Respiratory:  Negative for cough, chest tightness and shortness of breath.   Cardiovascular:  Negative for chest pain, palpitations and leg swelling.  Gastrointestinal:  Negative for abdominal pain and blood in stool.  Genitourinary:  Positive for frequency. Negative for difficulty urinating, dysuria, flank pain and hematuria.  Neurological:  Negative for dizziness, light-headedness and headaches.    Objective:   Vitals:   08/03/22 0930  BP: 134/80  Pulse: 74  Temp: 98 F (36.7 C)  TempSrc: Temporal  SpO2: 99%  Weight: 232 lb 3.2 oz (105.3 kg)  Height:  (1.88 m)     Physical Exam Vitals reviewed.  Constitutional:      Appearance: He is well-developed.  HENT:     Head: Normocephalic and atraumatic.  Neck:     Vascular: No carotid bruit or JVD.  Cardiovascular:     Rate and Rhythm: Normal rate and regular rhythm.     Heart sounds: Normal heart sounds. No murmur heard. Pulmonary:     Effort: Pulmonary effort is normal.     Breath sounds: Normal breath sounds. No rales.  Musculoskeletal:     Right lower leg: No edema.     Left lower leg: No edema.  Skin:    General: Skin is warm and dry.  Neurological:     Mental Status: He is alert and oriented to person, place, and time.  Psychiatric:        Mood and Affect: Mood normal.     Assessment & Plan:  Micheal Roberts is a 52 y.o. male . Hypertension, essential - Plan: Ambulatory referral to Pulmonology, Comprehensive metabolic panel  -Borderline control, option of 5 mg of amlodipine, can combine 2 of his 2.5's for now.  Will advise me if that is tolerated and I can send the 5 mg dose to his pharmacy.  Potential side effects discussed.  Will follow-up with  other sleep specialist to evaluate for OSA which may also be contributing  Daytime somnolence - Plan: Ambulatory referral to Pulmonology Insomnia, unspecified type - Plan: Ambulatory referral to Pulmonology  -New referral needed, will not be able to go to the other office.  Referred for sleep apnea testing/treatment if positive.  Urinary frequency - Plan: POCT Urinalysis Dipstick, Comprehensive metabolic panel  -Status post treatment as above, infrequent symptoms, unlikely infectious at this time.  Handout given on urinary frequency including possible triggers,  RTC precautions or follow-up with urology if persistent.  Screening for hyperlipidemia - Plan: Lipid panel   No orders of the defined types were placed in this encounter.  Patient Instructions  You can try taking 2 of the 2.5mg  amlodipine and see if that is tolerated and better blood pressures. Let me know as I can send in the 5mg  dose if that is tolerated.  I will refer you elsewhere for possible sleep apnea testing.   See info on frequent urination. Certain foods/fluids may be the issue. Unlikely infection at this time, but return if any worsening or new symptoms.   Return to the clinic or go to the nearest emergency room if any of your symptoms worsen or new symptoms occur.   Urinary Frequency, Adult Urinary frequency means urinating more often than usual. You may urinate every 1-2 hours even though you drink a normal amount of fluid and do not have a bladder infection or condition. Although you urinate more often than normal, the total amount of urine produced in a day is normal. With urinary frequency, you may have an urgent need to urinate often. The stress and anxiety of needing to find a bathroom quickly can make this urge worse. This condition may go away on its own, or you may need treatment at home. Home treatment may include bladder training, exercises, taking medicines, or making changes to your diet. Follow these  instructions at home: Bladder health Your health care provider will tell you what to do to improve bladder health. You may be told to: Keep a bladder diary. Keep track of: What you eat and drink. How often you urinate. How much you urinate. Follow a bladder training program. This may include: Learning to delay going to the bathroom. Double urinating, also called voiding. This helps if you are not completely emptying your bladder. Scheduled voiding. Do Kegel exercises. Kegel exercises strengthen the muscles that help control urination, which may help the condition.  Eating and drinking Follow instructions from your health care provider about eating or drinking restrictions. You may be told to: Avoid caffeine. Drink fewer fluids, especially alcohol. Avoid drinking in the evening. Avoid foods or drinks that may irritate the bladder. These include coffee, tea, soda, artificial sweeteners, citrus, tomato-based foods, and chocolate. Eat foods that help prevent or treat constipation. Constipation can make urinary frequency worse. You may need to take these actions to prevent or treat constipation: Drink enough fluid to keep your urine pale yellow. Take over-the-counter or prescription medicines. Eat foods that are high in fiber, such as beans, whole grains, and fresh fruits and vegetables. Limit foods that are high in fat and processed sugars, such as fried or sweet foods. General instructions Take over-the-counter and prescription medicines only as told by your health care provider. Keep all follow-up visits. This is important. Contact a health care provider if: You start urinating more often. You feel pain or irritation when you urinate. You notice blood in your urine. Your urine looks cloudy. You develop a fever. You begin vomiting. Get help right away if: You are unable to urinate. Summary Urinary frequency means urinating more often than usual. With urinary frequency, you may  urinate every 1-2 hours even though you drink a normal amount of fluid and do not have a bladder infection or other bladder condition. Your health care provider may recommend that you keep a bladder diary, follow a bladder training program, or make dietary changes. If told by your health care provider, do Kegel  exercises to strengthen the muscles that help control urination. Take over-the-counter and prescription medicines only as told by your health care provider. Contact a health care provider if your symptoms do not improve or get worse. This information is not intended to replace advice given to you by your health care provider. Make sure you discuss any questions you have with your health care provider. Document Revised: 11/08/2019 Document Reviewed: 11/08/2019 Elsevier Patient Education  2023 Elsevier Inc.       Signed,   Meredith Staggers, MD [facility], [facility] 08/03/22 10:39 AM

## 2022-08-07 ENCOUNTER — Encounter: Payer: Self-pay | Admitting: Family Medicine

## 2022-08-07 DIAGNOSIS — I10 Essential (primary) hypertension: Secondary | ICD-10-CM | POA: Insufficient documentation

## 2022-08-19 ENCOUNTER — Ambulatory Visit: Payer: BC Managed Care – PPO | Admitting: Pulmonary Disease

## 2022-08-19 ENCOUNTER — Encounter: Payer: Self-pay | Admitting: Pulmonary Disease

## 2022-08-19 VITALS — BP 112/88 | HR 71 | Ht 73.0 in | Wt 234.0 lb

## 2022-08-19 DIAGNOSIS — Z87898 Personal history of other specified conditions: Secondary | ICD-10-CM | POA: Diagnosis not present

## 2022-08-19 DIAGNOSIS — G478 Other sleep disorders: Secondary | ICD-10-CM

## 2022-08-19 NOTE — Addendum Note (Signed)
Addended by: Lanna Poche on: 08/19/2022 10:10 AM   Modules accepted: Orders

## 2022-08-19 NOTE — Patient Instructions (Signed)
Schedule for home sleep study  Weight loss efforts as tolerated  Try and sleep on your side if able  Elevating the head of the bed by about 30 degrees may also help snoring and number of events  I will see you back in about 3 months  Call us with significant concerns  Living With Sleep Apnea Sleep apnea is a condition in which breathing pauses or becomes shallow during sleep. Sleep apnea is most commonly caused by a collapsed or blocked airway. People with sleep apnea usually snore loudly. They may have times when they gasp and stop breathing for 10 seconds or more during sleep. This may happen many times during the night. The breaks in breathing also interrupt the deep sleep that you need to feel rested. Even if you do not completely wake up from the gaps in breathing, your sleep may not be restful and you feel tired during the day. You may also have a headache in the morning and low energy during the day, and you may feel anxious or depressed. How can sleep apnea affect me? Sleep apnea increases your chances of extreme tiredness during the day (daytime fatigue). It can also increase your risk for health conditions, such as: Heart attack. Stroke. Obesity. Type 2 diabetes. Heart failure. Irregular heartbeat. High blood pressure. If you have daytime fatigue as a result of sleep apnea, you may be more likely to: Perform poorly at school or work. Fall asleep while driving. Have difficulty with attention. Develop depression or anxiety. Have sexual dysfunction. What actions can I take to manage sleep apnea? Sleep apnea treatment  If you were given a device to open your airway while you sleep, use it only as told by your health care provider. You may be given: An oral appliance. This is a custom-made mouthpiece that shifts your lower jaw forward. A continuous positive airway pressure (CPAP) device. This device blows air through a mask when you breathe out (exhale). A nasal expiratory  positive airway pressure (EPAP) device. This device has valves that you put into each nostril. A bi-level positive airway pressure (BIPAP) device. This device blows air through a mask when you breathe in (inhale) and breathe out (exhale). You may need surgery if other treatments do not work for you. Sleep habits Go to sleep and wake up at the same time every day. This helps set your internal clock (circadian rhythm) for sleeping. If you stay up later than usual, such as on weekends, try to get up in the morning within 2 hours of your normal wake time. Try to get at least 7-9 hours of sleep each night. Stop using a computer, tablet, and mobile phone a few hours before bedtime. Do not take long naps during the day. If you nap, limit it to 30 minutes. Have a relaxing bedtime routine. Reading or listening to music may relax you and help you sleep. Use your bedroom only for sleep. Keep your television and computer out of your bedroom. Keep your bedroom cool, dark, and quiet. Use a supportive mattress and pillows. Follow your health care provider's instructions for other changes to sleep habits. Nutrition Do not eat heavy meals in the evening. Do not have caffeine in the later part of the day. The effects of caffeine can last for more than 5 hours. Follow your health care provider's or dietitian's instructions for any diet changes. Lifestyle     Do not drink alcohol before bedtime. Alcohol can cause you to fall asleep at first,  but then it can cause you to wake up in the middle of the night and have trouble getting back to sleep. Do not use any products that contain nicotine or tobacco. These products include cigarettes, chewing tobacco, and vaping devices, such as e-cigarettes. If you need help quitting, ask your health care provider. Medicines Take over-the-counter and prescription medicines only as told by your health care provider. Do not use over-the-counter sleep medicine. You can become  dependent on this medicine, and it can make sleep apnea worse. Do not use medicines, such as sedatives and narcotics, unless told by your health care provider. Activity Exercise on most days, but avoid exercising in the evening. Exercising near bedtime can interfere with sleeping. If possible, spend time outside every day. Natural light helps regulate your circadian rhythm. General information Lose weight if you need to, and maintain a healthy weight. Keep all follow-up visits. This is important. If you are having surgery, make sure to tell your health care provider that you have sleep apnea. You may need to bring your device with you. Where to find more information Learn more about sleep apnea and daytime fatigue from: American Sleep Association: sleepassociation.org National Sleep Foundation: sleepfoundation.org National Heart, Lung, and Blood Institute: [employer] Summary Sleep apnea is a condition in which breathing pauses or becomes shallow during sleep. Sleep apnea can cause daytime fatigue and other serious health conditions. You may need to wear a device while sleeping to help keep your airway open. If you are having surgery, make sure to tell your health care provider that you have sleep apnea. You may need to bring your device with you. Making changes to sleep habits, diet, lifestyle, and activity can help you manage sleep apnea. This information is not intended to replace advice given to you by your health care provider. Make sure you discuss any questions you have with your health care provider. Document Revised: 11/11/2020 Document Reviewed: 03/13/2020 Elsevier Patient Education  2023 [employer].

## 2022-08-19 NOTE — Progress Notes (Signed)
Micheal Roberts    [id number]    Aug 01, 1970  Primary Care Physician:Greene, Asencion Partridge, MD  Referring Physician: Shade Flood, MD [address] [place],  [place] [address]  Chief complaint:   Patient in for evaluation for concern about sleep apnea  HPI:  Has been told several times about apneas Wakes up choking, some gasping respirations at night Spouse is becoming more concerned about the events  Longstanding history of snoring  Usually goes to bed about 9:30 PM Takes him many hours to fall asleep About 4 awakenings Final wake up time about 7 AM  Weight is up minimally  Admits to occasional dryness of his mouth Occasional headaches Choking episodes No palpitations  No immediate family history of obstructive sleep apnea  He does have some anxiety that is controlled  Recently started antihypertensives  Has never had a sleep study  Outpatient Encounter Medications as of 08/19/2022  Medication Sig   amLODipine (NORVASC) 2.5 MG tablet TAKE 1 TABLET(2.5 MG) BY MOUTH DAILY   hydrOXYzine (ATARAX) 10 MG tablet Take 1 tablet (10 mg total) by mouth 3 (three) times daily as needed.   omeprazole (PRILOSEC) 40 MG capsule TAKE 1 CAPSULE(40 MG) BY MOUTH DAILY   [DISCONTINUED] ondansetron (ZOFRAN) 4 MG tablet Take 1 tablet (4 mg total) by mouth every 8 (eight) hours as needed for nausea or vomiting. (Patient not taking: Reported on 08/19/2022)   No facility-administered encounter medications on file as of 08/19/2022.    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    Past Medical History:  Diagnosis Date   Anxiety    IBS (irritable bowel syndrome)     Past Surgical History:  Procedure Laterality Date   left ACL arthroscopy      Family History  Problem Relation Age of Onset   Hypertension Mother    Anemia Mother    Thyroid disease Father    Hypertension Father    Stomach cancer  Neg Hx    Colon cancer Neg Hx    Pancreatic cancer Neg Hx     Social History   Socioeconomic History   Marital status: Married    Spouse name: Not on file   Number of children: Not on file   Years of education: Not on file   Highest education level: Not on file  Occupational History   Not on file  Tobacco Use   Smoking status: Former    Packs/day: 0.50    Years: 10.00    Additional pack years: 0.00    Total pack years: 5.00    Types: Cigarettes    Quit date: 2018    Years since quitting: 6.3   Smokeless tobacco: Never  Vaping Use   Vaping Use: Never used  Substance and Sexual Activity   Alcohol use: Yes    Alcohol/week: 0.0 standard drinks of alcohol    Comment: beer - only on ocassions   Drug use: No   Sexual activity: Yes  Other Topics Concern   Not on file  Social History Narrative   Caffeine: 1-2 cup daily.   Education:  one yr college.  (Did not finish).   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     Social Determinants of Health   Financial Resource Strain: Not on file  Food Insecurity: Not on file  Transportation Needs: Not on file  Physical Activity: Not on file  Stress: Not  on file  Social Connections: Not on file  Intimate Partner Violence: Not on file    Review of Systems  Psychiatric/Behavioral:  Positive for sleep disturbance.     Vitals:   08/19/22 0925 08/19/22 0930  BP: (!) 122/90 112/88  Pulse: 71   SpO2: 96%      Physical Exam Constitutional:      Appearance: He is obese.  HENT:     Head: Normocephalic.     Mouth/Throat:     Mouth: Mucous membranes are moist.     Comments: Crowded oropharynx, macroglossia, Mallampati 4 Eyes:     Pupils: Pupils are equal, round, and reactive to light.  Neck:     Comments: Neck is thick Cardiovascular:     Rate and Rhythm: Normal rate and regular rhythm.     Heart sounds: No murmur heard.    No friction rub.  Pulmonary:     Effort: No respiratory distress.     Breath sounds: No stridor. No wheezing or  rhonchi.  Musculoskeletal:     Cervical back: No rigidity or tenderness.  Neurological:     Mental Status: He is alert.  Psychiatric:        Mood and Affect: Mood normal.       08/19/2022    9:00 AM  Results of the Epworth flowsheet  Sitting and reading 0  Watching TV 2  Sitting, inactive in a public place (e.g. a theatre or a meeting) 0  As a passenger in a car for an hour without a break 0  Lying down to rest in the afternoon when circumstances permit 0  Sitting and talking to someone 0  Sitting quietly after a lunch without alcohol 0  In a car, while stopped for a few minutes in traffic 0  Total score 2     Data Reviewed: No previous sleep study on record  Assessment:  Moderate probability of significant obstructive sleep apnea  Class I obesity -Weight loss efforts recommended  Insomnia likely related to sleep disordered breathing  Pathophysiology of sleep disordered breathing discussed with the patient Treatment options discussed with the patient  Hypertension  Plan/Recommendations: Schedule patient for home sleep study  Encourage weight loss efforts  Risk with not treating sleep disordered breathing was discussed  Tentative follow-up in about 3 months   Virl Diamond MD [facility] 08/19/2022, 9:48 AM  CC: Shade Flood, MD

## 2022-09-09 ENCOUNTER — Other Ambulatory Visit: Payer: Self-pay | Admitting: Family Medicine

## 2022-09-09 ENCOUNTER — Other Ambulatory Visit: Payer: Self-pay | Admitting: Gastroenterology

## 2022-09-09 DIAGNOSIS — I10 Essential (primary) hypertension: Secondary | ICD-10-CM

## 2022-10-08 ENCOUNTER — Ambulatory Visit (INDEPENDENT_AMBULATORY_CARE_PROVIDER_SITE_OTHER): Payer: BC Managed Care – PPO

## 2022-10-08 ENCOUNTER — Telehealth: Payer: Self-pay | Admitting: Pulmonary Disease

## 2022-10-08 DIAGNOSIS — G4733 Obstructive sleep apnea (adult) (pediatric): Secondary | ICD-10-CM | POA: Diagnosis not present

## 2022-10-08 DIAGNOSIS — Z87898 Personal history of other specified conditions: Secondary | ICD-10-CM

## 2022-10-08 DIAGNOSIS — G478 Other sleep disorders: Secondary | ICD-10-CM

## 2022-10-08 NOTE — Telephone Encounter (Signed)
Call patient  Sleep study result  Date of study: 09/19/2022  Impression: Severe obstructive sleep apnea with moderate oxygen desaturations  Recommendation: DME referral  Recommend CPAP therapy for severe obstructive sleep apnea  Auto titrating CPAP with pressure settings of 5-20 will be appropriate  Encourage weight loss measures  Follow-up in the office 4 to 6 weeks following initiation of treatment

## 2022-10-10 NOTE — Telephone Encounter (Signed)
ATC x1 LVM for patient to call our office back. 

## 2022-10-10 NOTE — Telephone Encounter (Signed)
Pt returning call for results

## 2022-10-18 ENCOUNTER — Telehealth: Payer: Self-pay | Admitting: Pulmonary Disease

## 2022-10-18 DIAGNOSIS — G4733 Obstructive sleep apnea (adult) (pediatric): Secondary | ICD-10-CM

## 2022-10-18 NOTE — Telephone Encounter (Signed)
I called PT to make FU appt. He was asking when he'd get his CPAP. Please call to advise @ [phone]

## 2022-10-21 NOTE — Telephone Encounter (Signed)
Spoke with the pt and notified of results of sleep study. Pt verbalized understanding and was agreeable to CPAP therapy. I have placed DME referral for this and pt aware to contact the office for 31-90 day f/u once they begin using machine per insurance requirement.   

## 2022-10-21 NOTE — Telephone Encounter (Signed)
Pt. Returning nurses call

## 2022-10-21 NOTE — Telephone Encounter (Signed)
Per 10/08/22 phone note, the results have not been relayed the pt yet, so therefore no order has been placed for CPAP  I called him to go over results and there was no answer- LMTCB   Olalere, Adewale A, MD      10/08/22  5:28 PM Note Call patient   Sleep study result   Date of study: 09/19/2022   Impression: Severe obstructive sleep apnea with moderate oxygen desaturations   Recommendation: DME referral   Recommend CPAP therapy for severe obstructive sleep apnea   Auto titrating CPAP with pressure settings of 5-20 will be appropriate   Encourage weight loss measures   Follow-up in the office 4 to 6 weeks following initiation of treatment      This encounter is not signed. The conversation may still be ongoing.

## 2022-11-04 NOTE — Telephone Encounter (Signed)
ATC x2 LVM for patient to call our office back regarding sleep study results. Sent Via [employer]

## 2022-11-04 NOTE — Telephone Encounter (Signed)
Patient is returning phone call. Patient phone number is [phone].

## 2022-11-08 NOTE — Telephone Encounter (Signed)
Pt has been contacted, sleep study results has already been reviewed. Pt has yet received cpap from [facility]. I have called [facility] for a update.   [facility] did state they are alittle behind, they did receive the order and it is being processed.

## 2022-11-09 NOTE — Telephone Encounter (Signed)
Spoke with patient regarding sleep study  Call patient   Sleep study result   Date of study: 09/19/2022   Impression: Severe obstructive sleep apnea with moderate oxygen desaturations   Recommendation: DME referral   Recommend CPAP therapy for severe obstructive sleep apnea   Auto titrating CPAP with pressure settings of 5-20 will be appropriate   Encourage weight loss measures   Follow-up in the office 4 to 6 weeks following initiation of treatment    Order has been placed and patient's voice was understanding.   Nothing else further needed at this time.

## 2023-01-03 ENCOUNTER — Ambulatory Visit: Payer: BC Managed Care – PPO | Admitting: Pulmonary Disease

## 2023-02-02 ENCOUNTER — Ambulatory Visit: Payer: BC Managed Care – PPO | Admitting: Family Medicine

## 2023-02-02 ENCOUNTER — Encounter: Payer: Self-pay | Admitting: Family Medicine

## 2023-02-02 VITALS — BP 146/100 | HR 78 | Temp 97.9°F | Ht 73.0 in | Wt 243.2 lb

## 2023-02-02 DIAGNOSIS — L299 Pruritus, unspecified: Secondary | ICD-10-CM

## 2023-02-02 DIAGNOSIS — G4733 Obstructive sleep apnea (adult) (pediatric): Secondary | ICD-10-CM

## 2023-02-02 DIAGNOSIS — I10 Essential (primary) hypertension: Secondary | ICD-10-CM | POA: Diagnosis not present

## 2023-02-02 DIAGNOSIS — Z23 Encounter for immunization: Secondary | ICD-10-CM

## 2023-02-02 DIAGNOSIS — R3911 Hesitancy of micturition: Secondary | ICD-10-CM | POA: Diagnosis not present

## 2023-02-02 DIAGNOSIS — F418 Other specified anxiety disorders: Secondary | ICD-10-CM | POA: Diagnosis not present

## 2023-02-02 DIAGNOSIS — R300 Dysuria: Secondary | ICD-10-CM

## 2023-02-02 DIAGNOSIS — Z0000 Encounter for general adult medical examination without abnormal findings: Secondary | ICD-10-CM | POA: Diagnosis not present

## 2023-02-02 MED ORDER — AMLODIPINE BESYLATE 5 MG PO TABS
5.0000 mg | ORAL_TABLET | Freq: Every day | ORAL | 1 refills | Status: DC
Start: 2023-02-02 — End: 2023-07-18

## 2023-02-02 NOTE — Progress Notes (Signed)
Subjective:  Patient ID: Micheal Roberts, male    DOB: 1971/03/12  Age: 52 y.o. MRN: [id number]  CC:  Chief Complaint  Patient presents with   Annual Exam    No concerns. Patient requests labs today.     HPI Micheal Roberts presents for Annual Exam PCP, [facility], Dr. Aldean Ast.  History of snoring, office visit in May Home sleep test demonstrated severe abnormal breathing events, severe obstructive sleep apnea with moderate oxygen desaturation.  CPAP ordered from [facility]. GI - Dr. Christella Hartigan, new provider planned as retired. no recent changes in IBS.   Hypertension: Amlodipine 2.5 mg daily.  CPAP ordered as above has been using nightly. Sleeping better. Only on CPAP for about a month.  Home readings: 129/100? Not sure machine is effective.  Did not try 5mg  dosing.  Past few days short of breath at times - notes with anxious only, not with activity.  No chest pain. No cough. No fever, congestion, runny nose. Feels like shortness of breath  with more anxiety recently. Thoughts of health.  Hydroxyzine in past - not recently - out of meds. Helped in past when anxious.  Nervous about flight soon. Flying next week for the first time - 2 days in [place], then week in [place].  Recent anxiety.  No SI/HI.  BP Readings from Last 3 Encounters:  02/02/23 (!) 146/100  08/19/22 112/88  08/03/22 134/80   Lab Results  Component Value Date   CREATININE 1.20 08/03/2022      02/02/2023    1:09 PM 06/22/2022    3:42 PM 09/03/2020    1:33 PM  GAD 7 : Generalized Anxiety Score  Nervous, Anxious, on Edge 0 1 1  Control/stop worrying 1 0 0  Worry too much - different things 1 0 0  Trouble relaxing 0 0 0  Restless 0 0 1  Easily annoyed or irritable 0 0 0  Afraid - awful might happen 0 0 1  Total GAD 7 Score 2 1 3   Anxiety Difficulty Somewhat difficult Not difficult at all           02/02/2023    1:09 PM 06/22/2022    3:42 PM 04/29/2022   11:22 AM 03/09/2022   11:04 AM 02/24/2022   11:26 AM   Depression screen PHQ 2/9  Decreased Interest 0 0 1 0 0  Down, Depressed, Hopeless 0 0 0 0 0  PHQ - 2 Score 0 0 1 0 0  Altered sleeping 0 1 2 0 2  Tired, decreased energy 1 1 1  0 1  Change in appetite 0 0 0 0 0  Feeling bad or failure about yourself  0 0 0 0 0  Trouble concentrating 0 0 0 0 0  Moving slowly or fidgety/restless 0 0 0 0 0  Suicidal thoughts 0 0 0 0 0  PHQ-9 Score 1 2 4  0 3  Difficult doing work/chores Not difficult at all        Health Maintenance  Topic Date Due   Zoster Vaccines- Shingrix (2 of 2) 05/13/2021   INFLUENZA VACCINE  11/17/2022   COVID-19 Vaccine (1 - 2023-24 season) Never done   Colonoscopy  05/24/2028   DTaP/Tdap/Td (2 - Td or Tdap) 09/04/2030   Hepatitis C Screening  Completed   HIV Screening  Completed   HPV VACCINES  Aged Out  Colonoscopy 05/24/21 - repeat 5 years.  Prostate: does not have family history of prostate cancer The natural history of prostate cancer  and ongoing controversy regarding screening and potential treatment outcomes of prostate cancer has been discussed with the patient. The meaning of a false positive PSA and a false negative PSA has been discussed. He indicates understanding of the limitations of this screening test and wishes  to proceed with screening PSA testing. Recent testing in March.  Sometimes difficulty with onset, hesitancy. No retention, no urgency/hematuria.  No penile d/c or new sexual partners. Occasional burning with urination. Improved form April until recurrence a few weeks ago.  Lab Results  Component Value Date   PSA1 0.2 04/05/2018   PSA 0.14 06/22/2022   PSA 0.15 09/03/2020   PSA 0.25 07/25/2013    Immunization History  Administered Date(s) Administered   Influenza,inj,Quad PF,6+ Mos 05/25/2015, 06/21/2016, 04/05/2018, 04/25/2019, 03/18/2021, 02/24/2022   Tdap 09/03/2020   Zoster Recombinant(Shingrix) 03/18/2021  Flu vaccine today.  2nd shingrix - declined.   No results found. Optho -  yearly  Dental:due, appt in December.   Alcohol:none  Tobacco: none  Exercise:none recently - prior gym, busy recently.    History Patient Active Problem List   Diagnosis Date Noted   Hypertension, essential 08/07/2022   Acute prostatitis 06/22/2022   IBS (irritable bowel syndrome)    Past Medical History:  Diagnosis Date   Anxiety    IBS (irritable bowel syndrome)    Past Surgical History:  Procedure Laterality Date   left ACL arthroscopy     Allergies  Allergen Reactions   Benzonatate Anaphylaxis   Prior to Admission medications   Medication Sig Start Date End Date Taking? Authorizing Provider  amLODipine (NORVASC) 2.5 MG tablet TAKE 1 TABLET(2.5 MG) BY MOUTH DAILY 09/09/22  Yes Shade Flood, MD  hydrOXYzine (ATARAX) 10 MG tablet Take 1 tablet (10 mg total) by mouth 3 (three) times daily as needed. 02/24/22  Yes Shade Flood, MD  omeprazole (PRILOSEC) 40 MG capsule Take 1 capsule (40 mg total) by mouth daily. Patient needs follow up appointment for future refills. Please call [phone] to schedule an appointment. 09/09/22  Yes Meryl Dare, MD   Social History   Socioeconomic History   Marital status: Married    Spouse name: Not on file   Number of children: Not on file   Years of education: Not on file   Highest education level: Not on file  Occupational History   Not on file  Tobacco Use   Smoking status: Former    Current packs/day: 0.00    Average packs/day: 0.5 packs/day for 10.0 years (5.0 ttl pk-yrs)    Types: Cigarettes    Start date: 2008    Quit date: 2018    Years since quitting: 6.7   Smokeless tobacco: Never  Vaping Use   Vaping status: Never Used  Substance and Sexual Activity   Alcohol use: Yes    Alcohol/week: 0.0 standard drinks of alcohol    Comment: beer - only on ocassions   Drug use: No   Sexual activity: Yes  Other Topics Concern   Not on file  Social History Narrative   Caffeine: 1-2 cup daily.   Education:  one  yr college.  (Did not finish).   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     Social Determinants of Health   Financial Resource Strain: Not on file  Food Insecurity: Not on file  Transportation Needs: Not on file  Physical Activity: Not on file  Stress: Not on file  Social Connections: Not on file  Intimate Partner Violence: Not on file  Review of Systems 13 point review of systems per patient health survey noted.  Negative other than as indicated above or in HPI.    Objective:   Vitals:   02/02/23 1305  BP: (!) 146/100  Pulse: 78  Temp: 97.9 F (36.6 C)  TempSrc: Oral  SpO2: 96%  Weight: 243 lb 3.2 oz (110.3 kg)  Height: 6\' 1"  (1.854 m)     Physical Exam Vitals reviewed.  Constitutional:      Appearance: He is well-developed.  HENT:     Head: Normocephalic and atraumatic.     Right Ear: External ear normal.     Left Ear: External ear normal.  Eyes:     Conjunctiva/sclera: Conjunctivae normal.     Pupils: Pupils are equal, round, and reactive to light.  Neck:     Thyroid: No thyromegaly.  Cardiovascular:     Rate and Rhythm: Normal rate and regular rhythm.     Heart sounds: Normal heart sounds.  Pulmonary:     Effort: Pulmonary effort is normal. No respiratory distress.     Breath sounds: Normal breath sounds. No wheezing.  Abdominal:     General: There is no distension.     Palpations: Abdomen is soft.     Tenderness: There is no abdominal tenderness.  Musculoskeletal:        General: No tenderness. Normal range of motion.     Cervical back: Normal range of motion and neck supple.  Lymphadenopathy:     Cervical: No cervical adenopathy.  Skin:    General: Skin is warm and dry.  Neurological:     Mental Status: He is alert and oriented to person, place, and time.     Deep Tendon Reflexes: Reflexes are normal and symmetric.  Psychiatric:        Behavior: Behavior normal.        Assessment & Plan:  Micheal Roberts is a 52 y.o. male . Annual physical  exam  - -anticipatory guidance as below in AVS, screening labs above. Health maintenance items as above in HPI discussed/recommended as applicable.   Need for immunization against influenza - Plan: Flu vaccine trivalent PF, 6mos and older(Flulaval,Afluria,Fluarix,Fluzone)  OSA on CPAP  -On CPAP treatment as above, continue same.  Situational anxiety  -Intermittent anxiety with planned travel as above.  Hydroxyzine refilled temporarily, handout given on management of anxiety, recheck next 2 weeks.  Dyspnea noted with anxiety only, reassuring exam, reassuring history.  RTC precautions.  Hypertension, essential - Plan: Comprehensive metabolic panel, Lipid panel, amLODipine (NORVASC) 5 MG tablet  -Decreased control, will increase amlodipine to 5 mg daily, recent start of CPAP should also help.  Recheck next few weeks.  Urinary hesitancy - Plan: POCT urinalysis dipstick, PSA Dysuria  -New concern, check urinalysis, PSA and consider treatment based on results.  RTC precautions in 2-week recheck.  Meds ordered this encounter  Medications   amLODipine (NORVASC) 5 MG tablet    Sig: Take 1 tablet (5 mg total) by mouth daily.    Dispense:  90 tablet    Refill:  1   Patient Instructions  I will refill hydroxyzine temporarily for now see information below on management of anxiety symptoms but I would like to talk about that further at follow-up in the next 2 weeks.  I am checking a prostate test and urine test today.  If any concerns for infection I will let you know, but if the symptoms are not improving at her follow-up and no sign  of infection I would recommend you seek care with urology and I can refer you to them if needed.  If any worsening symptoms prior to next visit be seen sooner.  Increase amlodipine to 5 mg/day for elevated blood pressure.  Sleep apnea treatment can help blood pressure but given that you have already been using that for the past month and blood pressure still elevated I  think we do need to make some changes today.   Return to the clinic or go to the nearest emergency room if any of your symptoms worsen or new symptoms occur.  Preventive Care 51-71 Years Old, Male Preventive care refers to lifestyle choices and visits with your health care provider that can promote health and wellness. Preventive care visits are also called wellness exams. What can I expect for my preventive care visit? Counseling During your preventive care visit, your health care provider may ask about your: Medical history, including: Past medical problems. Family medical history. Current health, including: Emotional well-being. Home life and relationship well-being. Sexual activity. Lifestyle, including: Alcohol, nicotine or tobacco, and drug use. Access to firearms. Diet, exercise, and sleep habits. Safety issues such as seatbelt and bike helmet use. Sunscreen use. Work and work Astronomer. Physical exam Your health care provider will check your: Height and weight. These may be used to calculate your BMI (body mass index). BMI is a measurement that tells if you are at a healthy weight. Waist circumference. This measures the distance around your waistline. This measurement also tells if you are at a healthy weight and may help predict your risk of certain diseases, such as type 2 diabetes and high blood pressure. Heart rate and blood pressure. Body temperature. Skin for abnormal spots. What immunizations do I need?  Vaccines are usually given at various ages, according to a schedule. Your health care provider will recommend vaccines for you based on your age, medical history, and lifestyle or other factors, such as travel or where you work. What tests do I need? Screening Your health care provider may recommend screening tests for certain conditions. This may include: Lipid and cholesterol levels. Diabetes screening. This is done by checking your blood sugar (glucose) after  you have not eaten for a while (fasting). Hepatitis B test. Hepatitis C test. HIV (human immunodeficiency virus) test. STI (sexually transmitted infection) testing, if you are at risk. Lung cancer screening. Prostate cancer screening. Colorectal cancer screening. Talk with your health care provider about your test results, treatment options, and if necessary, the need for more tests. Follow these instructions at home: Eating and drinking  Eat a diet that includes fresh fruits and vegetables, whole grains, lean protein, and low-fat dairy products. Take vitamin and mineral supplements as recommended by your health care provider. Do not drink alcohol if your health care provider tells you not to drink. If you drink alcohol: Limit how much you have to 0-2 drinks a day. Know how much alcohol is in your drink. In the [place] one drink equals one 12 oz bottle of beer (355 mL), one 5 oz glass of wine (148 mL), or one 1 oz glass of hard liquor (44 mL). Lifestyle Brush your teeth every morning and night with fluoride toothpaste. Floss one time each day. Exercise for at least 30 minutes 5 or more days each week. Do not use any products that contain nicotine or tobacco. These products include cigarettes, chewing tobacco, and vaping devices, such as e-cigarettes. If you need help quitting, ask your health care  provider. Do not use drugs. If you are sexually active, practice safe sex. Use a condom or other form of protection to prevent STIs. Take aspirin only as told by your health care provider. Make sure that you understand how much to take and what form to take. Work with your health care provider to find out whether it is safe and beneficial for you to take aspirin daily. Find healthy ways to manage stress, such as: Meditation, yoga, or listening to music. Journaling. Talking to a trusted person. Spending time with friends and family. Minimize exposure to UV radiation to reduce your risk of skin  cancer. Safety Always wear your seat belt while driving or riding in a vehicle. Do not drive: If you have been drinking alcohol. Do not ride with someone who has been drinking. When you are tired or distracted. While texting. If you have been using any mind-altering substances or drugs. Wear a helmet and other protective equipment during sports activities. If you have firearms in your house, make sure you follow all gun safety procedures. What's next? Go to your health care provider once a year for an annual wellness visit. Ask your health care provider how often you should have your eyes and teeth checked. Stay up to date on all vaccines. This information is not intended to replace advice given to you by your health care provider. Make sure you discuss any questions you have with your health care provider. Document Revised: 09/30/2020 Document Reviewed: 09/30/2020 Elsevier Patient Education  2024 Elsevier Inc.   Managing Anxiety, Adult After being diagnosed with anxiety, you may be relieved to know why you have felt or behaved a certain way. You may also feel overwhelmed about the treatment ahead and what it will mean for your life. With care and support, you can manage your anxiety. How to manage lifestyle changes Understanding the difference between stress and anxiety Although stress can play a role in anxiety, it is not the same as anxiety. Stress is your body's reaction to life changes and events, both good and bad. Stress is often caused by something external, such as a deadline, test, or competition. It normally goes away after the event has ended and will last just a few hours. But, stress can be ongoing and can lead to more than just stress. Anxiety is caused by something internal, such as imagining a terrible outcome or worrying that something will go wrong that will greatly upset you. Anxiety often does not go away even after the event is over, and it can become a long-term  (chronic) worry. Lowering stress and anxiety Talk with your health care provider or a counselor to learn more about lowering anxiety and stress. They may suggest tension-reduction techniques, such as: Music. Spend time creating or listening to music that you enjoy and that inspires you. Mindfulness-based meditation. Practice being aware of your normal breaths while not trying to control your breathing. It can be done while sitting or walking. Centering prayer. Focus on a word, phrase, or sacred image that means something to you and brings you peace. Deep breathing. Expand your stomach and inhale slowly through your nose. Hold your breath for 3-5 seconds. Then breathe out slowly, letting your stomach muscles relax. Self-talk. Learn to notice and spot thought patterns that lead to anxiety reactions. Change those patterns to thoughts that feel peaceful. Muscle relaxation. Take time to tense muscles and then relax them. Choose a tension-reduction technique that fits your lifestyle and personality. These techniques take time  and practice. Set aside 5-15 minutes a day to do them. Specialized therapists can offer counseling and training in these techniques. The training to help with anxiety may be covered by some insurance plans. Other things you can do to manage stress and anxiety include: Keeping a stress diary. This can help you learn what triggers your reaction and then learn ways to manage your response. Thinking about how you react to certain situations. You may not be able to control everything, but you can control your response. Making time for activities that help you relax and not feeling guilty about spending your time in this way. Doing visual imagery. This involves imagining or creating mental pictures to help you relax. Practicing yoga. Through yoga poses, you can lower tension and relax.  Medicines Medicines for anxiety include: Antidepressant medicines. These are usually prescribed for  long-term daily control. Anti-anxiety medicines. These may be added in severe cases, especially when panic attacks occur. When used together, medicines, psychotherapy, and tension-reduction techniques may be the most effective treatment. Relationships Relationships can play a big part in helping you recover. Spend more time connecting with trusted friends and family members. Think about going to couples counseling if you have a partner, taking family education classes, or going to family therapy. Therapy can help you and others better understand your anxiety. How to recognize changes in your anxiety Everyone responds differently to treatment for anxiety. Recovery from anxiety happens when symptoms lessen and stop interfering with your daily life at home or work. This may mean that you will start to: Have better concentration and focus. Worry will interfere less in your daily thinking. Sleep better. Be less irritable. Have more energy. Have improved memory. Try to recognize when your condition is getting worse. Contact your provider if your symptoms interfere with home or work and you feel like your condition is not improving. Follow these instructions at home: Activity Exercise. Adults should: Exercise for at least 150 minutes each week. The exercise should increase your heart rate and make you sweat (moderate-intensity exercise). Do strengthening exercises at least twice a week. Get the right amount and quality of sleep. Most adults need 7-9 hours of sleep each night. Lifestyle  Eat a healthy diet that includes plenty of vegetables, fruits, whole grains, low-fat dairy products, and lean protein. Do not eat a lot of foods that are high in fats, added sugars, or salt (sodium). Make choices that simplify your life. Do not use any products that contain nicotine or tobacco. These products include cigarettes, chewing tobacco, and vaping devices, such as e-cigarettes. If you need help quitting, ask  your provider. Avoid caffeine, alcohol, and certain over-the-counter cold medicines. These may make you feel worse. Ask your pharmacist which medicines to avoid. General instructions Take over-the-counter and prescription medicines only as told by your provider. Keep all follow-up visits. This is to make sure you are managing your anxiety well or if you need more support. Where to find support You can get help and support from: Self-help groups. Online and [employer]. A trusted spiritual leader. Couples counseling. Family education classes. Family therapy. Where to find more information You may find that joining a support group helps you deal with your anxiety. The following sources can help you find counselors or support groups near you: Mental Health America: mentalhealthamerica.net Anxiety and Depression Association of [place] (ADAA): adaa.org The [employer] on Mental Illness (NAMI): nami.org Contact a health care provider if: You have a hard time staying focused or finishing tasks.  You spend many hours a day feeling worried about everyday life. You are very tired because you cannot stop worrying. You start to have headaches or often feel tense. You have chronic nausea or diarrhea. Get help right away if: Your heart feels like it is racing. You have shortness of breath. You have thoughts of hurting yourself or others. Get help right away if you feel like you may hurt yourself or others, or have thoughts about taking your own life. Go to your nearest emergency room or: Call 911. Call the National Suicide Prevention Lifeline at (510) 660-3934 or 988. This is open 24 hours a day. Text the Crisis Text Line at (779)424-4227. This information is not intended to replace advice given to you by your health care provider. Make sure you discuss any questions you have with your health care provider. Document Revised: 01/11/2022 Document Reviewed: 07/26/2020 Elsevier Patient  Education  2024 Elsevier Inc.       Signed,   Meredith Staggers, MD [facility], [facility] 02/02/23 1:16 PM

## 2023-02-02 NOTE — Patient Instructions (Addendum)
I will refill hydroxyzine temporarily for now see information below on management of anxiety symptoms but I would like to talk about that further at follow-up in the next 2 weeks.  I am checking a prostate test and urine test today.  If any concerns for infection I will let you know, but if the symptoms are not improving at her follow-up and no sign of infection I would recommend you seek care with urology and I can refer you to them if needed.  If any worsening symptoms prior to next visit be seen sooner.  Increase amlodipine to 5 mg/day for elevated blood pressure.  Sleep apnea treatment can help blood pressure but given that you have already been using that for the past month and blood pressure still elevated I think we do need to make some changes today.   Return to the clinic or go to the nearest emergency room if any of your symptoms worsen or new symptoms occur.  Preventive Care 60-29 Years Old, Male Preventive care refers to lifestyle choices and visits with your health care provider that can promote health and wellness. Preventive care visits are also called wellness exams. What can I expect for my preventive care visit? Counseling During your preventive care visit, your health care provider may ask about your: Medical history, including: Past medical problems. Family medical history. Current health, including: Emotional well-being. Home life and relationship well-being. Sexual activity. Lifestyle, including: Alcohol, nicotine or tobacco, and drug use. Access to firearms. Diet, exercise, and sleep habits. Safety issues such as seatbelt and bike helmet use. Sunscreen use. Work and work Astronomer. Physical exam Your health care provider will check your: Height and weight. These may be used to calculate your BMI (body mass index). BMI is a measurement that tells if you are at a healthy weight. Waist circumference. This measures the distance around your waistline. This  measurement also tells if you are at a healthy weight and may help predict your risk of certain diseases, such as type 2 diabetes and high blood pressure. Heart rate and blood pressure. Body temperature. Skin for abnormal spots. What immunizations do I need?  Vaccines are usually given at various ages, according to a schedule. Your health care provider will recommend vaccines for you based on your age, medical history, and lifestyle or other factors, such as travel or where you work. What tests do I need? Screening Your health care provider may recommend screening tests for certain conditions. This may include: Lipid and cholesterol levels. Diabetes screening. This is done by checking your blood sugar (glucose) after you have not eaten for a while (fasting). Hepatitis B test. Hepatitis C test. HIV (human immunodeficiency virus) test. STI (sexually transmitted infection) testing, if you are at risk. Lung cancer screening. Prostate cancer screening. Colorectal cancer screening. Talk with your health care provider about your test results, treatment options, and if necessary, the need for more tests. Follow these instructions at home: Eating and drinking  Eat a diet that includes fresh fruits and vegetables, whole grains, lean protein, and low-fat dairy products. Take vitamin and mineral supplements as recommended by your health care provider. Do not drink alcohol if your health care provider tells you not to drink. If you drink alcohol: Limit how much you have to 0-2 drinks a day. Know how much alcohol is in your drink. In the [place] one drink equals one 12 oz bottle of beer (355 mL), one 5 oz glass of wine (148 mL), or one 1 oz  glass of hard liquor (44 mL). Lifestyle Brush your teeth every morning and night with fluoride toothpaste. Floss one time each day. Exercise for at least 30 minutes 5 or more days each week. Do not use any products that contain nicotine or tobacco. These products  include cigarettes, chewing tobacco, and vaping devices, such as e-cigarettes. If you need help quitting, ask your health care provider. Do not use drugs. If you are sexually active, practice safe sex. Use a condom or other form of protection to prevent STIs. Take aspirin only as told by your health care provider. Make sure that you understand how much to take and what form to take. Work with your health care provider to find out whether it is safe and beneficial for you to take aspirin daily. Find healthy ways to manage stress, such as: Meditation, yoga, or listening to music. Journaling. Talking to a trusted person. Spending time with friends and family. Minimize exposure to UV radiation to reduce your risk of skin cancer. Safety Always wear your seat belt while driving or riding in a vehicle. Do not drive: If you have been drinking alcohol. Do not ride with someone who has been drinking. When you are tired or distracted. While texting. If you have been using any mind-altering substances or drugs. Wear a helmet and other protective equipment during sports activities. If you have firearms in your house, make sure you follow all gun safety procedures. What's next? Go to your health care provider once a year for an annual wellness visit. Ask your health care provider how often you should have your eyes and teeth checked. Stay up to date on all vaccines. This information is not intended to replace advice given to you by your health care provider. Make sure you discuss any questions you have with your health care provider. Document Revised: 09/30/2020 Document Reviewed: 09/30/2020 Elsevier Patient Education  2024 [employer]   Managing Anxiety, Adult After being diagnosed with anxiety, you may be relieved to know why you have felt or behaved a certain way. You may also feel overwhelmed about the treatment ahead and what it will mean for your life. With care and support, you can manage  your anxiety. How to manage lifestyle changes Understanding the difference between stress and anxiety Although stress can play a role in anxiety, it is not the same as anxiety. Stress is your body's reaction to life changes and events, both good and bad. Stress is often caused by something external, such as a deadline, test, or competition. It normally goes away after the event has ended and will last just a few hours. But, stress can be ongoing and can lead to more than just stress. Anxiety is caused by something internal, such as imagining a terrible outcome or worrying that something will go wrong that will greatly upset you. Anxiety often does not go away even after the event is over, and it can become a long-term (chronic) worry. Lowering stress and anxiety Talk with your health care provider or a counselor to learn more about lowering anxiety and stress. They may suggest tension-reduction techniques, such as: Music. Spend time creating or listening to music that you enjoy and that inspires you. Mindfulness-based meditation. Practice being aware of your normal breaths while not trying to control your breathing. It can be done while sitting or walking. Centering prayer. Focus on a word, phrase, or sacred image that means something to you and brings you peace. Deep breathing. Expand your stomach and inhale  slowly through your nose. Hold your breath for 3-5 seconds. Then breathe out slowly, letting your stomach muscles relax. Self-talk. Learn to notice and spot thought patterns that lead to anxiety reactions. Change those patterns to thoughts that feel peaceful. Muscle relaxation. Take time to tense muscles and then relax them. Choose a tension-reduction technique that fits your lifestyle and personality. These techniques take time and practice. Set aside 5-15 minutes a day to do them. Specialized therapists can offer counseling and training in these techniques. The training to help with anxiety may  be covered by some insurance plans. Other things you can do to manage stress and anxiety include: Keeping a stress diary. This can help you learn what triggers your reaction and then learn ways to manage your response. Thinking about how you react to certain situations. You may not be able to control everything, but you can control your response. Making time for activities that help you relax and not feeling guilty about spending your time in this way. Doing visual imagery. This involves imagining or creating mental pictures to help you relax. Practicing yoga. Through yoga poses, you can lower tension and relax.  Medicines Medicines for anxiety include: Antidepressant medicines. These are usually prescribed for long-term daily control. Anti-anxiety medicines. These may be added in severe cases, especially when panic attacks occur. When used together, medicines, psychotherapy, and tension-reduction techniques may be the most effective treatment. Relationships Relationships can play a big part in helping you recover. Spend more time connecting with trusted friends and family members. Think about going to couples counseling if you have a partner, taking family education classes, or going to family therapy. Therapy can help you and others better understand your anxiety. How to recognize changes in your anxiety Everyone responds differently to treatment for anxiety. Recovery from anxiety happens when symptoms lessen and stop interfering with your daily life at home or work. This may mean that you will start to: Have better concentration and focus. Worry will interfere less in your daily thinking. Sleep better. Be less irritable. Have more energy. Have improved memory. Try to recognize when your condition is getting worse. Contact your provider if your symptoms interfere with home or work and you feel like your condition is not improving. Follow these instructions at home: Activity Exercise.  Adults should: Exercise for at least 150 minutes each week. The exercise should increase your heart rate and make you sweat (moderate-intensity exercise). Do strengthening exercises at least twice a week. Get the right amount and quality of sleep. Most adults need 7-9 hours of sleep each night. Lifestyle  Eat a healthy diet that includes plenty of vegetables, fruits, whole grains, low-fat dairy products, and lean protein. Do not eat a lot of foods that are high in fats, added sugars, or salt (sodium). Make choices that simplify your life. Do not use any products that contain nicotine or tobacco. These products include cigarettes, chewing tobacco, and vaping devices, such as e-cigarettes. If you need help quitting, ask your provider. Avoid caffeine, alcohol, and certain over-the-counter cold medicines. These may make you feel worse. Ask your pharmacist which medicines to avoid. General instructions Take over-the-counter and prescription medicines only as told by your provider. Keep all follow-up visits. This is to make sure you are managing your anxiety well or if you need more support. Where to find support You can get help and support from: Self-help groups. Online and [employer]. A trusted spiritual leader. Couples counseling. Family education classes. Family therapy. Where to  find more information You may find that joining a support group helps you deal with your anxiety. The following sources can help you find counselors or support groups near you: Mental Health [place]: mentalhealthamerica.net Anxiety and [employer] of [place] (ADAA): adaa.org [employer] Mental Illness (NAMI): nami.org Contact a health care provider if: You have a hard time staying focused or finishing tasks. You spend many hours a day feeling worried about everyday life. You are very tired because you cannot stop worrying. You start to have headaches or often feel tense. You  have chronic nausea or diarrhea. Get help right away if: Your heart feels like it is racing. You have shortness of breath. You have thoughts of hurting yourself or others. Get help right away if you feel like you may hurt yourself or others, or have thoughts about taking your own life. Go to your nearest emergency room or: Call 911. Call the National Suicide Prevention Lifeline at 2020753008 or 988. This is open 24 hours a day. Text the Crisis Text Line at 718 250 2374. This information is not intended to replace advice given to you by your health care provider. Make sure you discuss any questions you have with your health care provider. Document Revised: 01/11/2022 Document Reviewed: 07/26/2020 Elsevier Patient Education  2024 [employer].

## 2023-02-03 LAB — LIPID PANEL
Cholesterol: 165 mg/dL (ref 0–200)
HDL: 27.8 mg/dL — ABNORMAL LOW (ref >39.00)
LDL Cholesterol: 89 mg/dL (ref 0–99)
NonHDL: 137.03
Total CHOL/HDL Ratio: 6
Triglycerides: 242 mg/dL — ABNORMAL HIGH (ref 0.0–149.0)
VLDL: 48.4 mg/dL — ABNORMAL HIGH (ref 0.0–40.0)

## 2023-02-03 LAB — COMPREHENSIVE METABOLIC PANEL
ALT: 60 U/L — ABNORMAL HIGH (ref 0–53)
AST: 41 U/L — ABNORMAL HIGH (ref 0–37)
Albumin: 4.4 g/dL (ref 3.5–5.2)
Alkaline Phosphatase: 67 U/L (ref 39–117)
BUN: 8 mg/dL (ref 6–23)
CO2: 32 meq/L (ref 19–32)
Calcium: 9.5 mg/dL (ref 8.4–10.5)
Chloride: 98 meq/L (ref 96–112)
Creatinine, Ser: 1.24 mg/dL (ref 0.40–1.50)
GFR: 66.92 mL/min (ref >60.00)
Glucose, Bld: 78 mg/dL (ref 70–99)
Potassium: 3.8 meq/L (ref 3.5–5.1)
Sodium: 139 meq/L (ref 135–145)
Total Bilirubin: 0.7 mg/dL (ref 0.2–1.2)
Total Protein: 7.3 g/dL (ref 6.0–8.3)

## 2023-02-03 LAB — PSA: PSA: 0.17 ng/mL (ref 0.10–4.00)

## 2023-02-05 MED ORDER — HYDROXYZINE HCL 10 MG PO TABS: 10.0000 mg | ORAL_TABLET | Freq: Three times a day (TID) | ORAL | 0 refills | Status: DC | PRN

## 2023-02-09 ENCOUNTER — Ambulatory Visit: Payer: BC Managed Care – PPO | Admitting: Pulmonary Disease

## 2023-02-24 ENCOUNTER — Ambulatory Visit: Payer: BC Managed Care – PPO | Admitting: Family Medicine

## 2023-03-22 ENCOUNTER — Ambulatory Visit: Payer: BC Managed Care – PPO | Admitting: Family Medicine

## 2023-03-22 ENCOUNTER — Other Ambulatory Visit (HOSPITAL_COMMUNITY)
Admission: RE | Admit: 2023-03-22 | Discharge: 2023-03-22 | Disposition: A | Discharge status: Home / Self Care | Payer: BC Managed Care – PPO | Source: Ambulatory Visit | Attending: Family Medicine | Admitting: Family Medicine

## 2023-03-22 VITALS — BP 124/76 | HR 73 | Temp 97.4°F | Ht 73.0 in | Wt 244.4 lb

## 2023-03-22 DIAGNOSIS — R140 Abdominal distension (gaseous): Secondary | ICD-10-CM | POA: Diagnosis not present

## 2023-03-22 DIAGNOSIS — G4733 Obstructive sleep apnea (adult) (pediatric): Secondary | ICD-10-CM | POA: Diagnosis not present

## 2023-03-22 DIAGNOSIS — F418 Other specified anxiety disorders: Secondary | ICD-10-CM

## 2023-03-22 DIAGNOSIS — R300 Dysuria: Secondary | ICD-10-CM

## 2023-03-22 DIAGNOSIS — I10 Essential (primary) hypertension: Secondary | ICD-10-CM

## 2023-03-22 NOTE — Patient Instructions (Addendum)
Glad to hear you are better.  See info on managing anxiety - let me know if that increases.  Blood pressure ok here. See info on checking that at home and return with your monitor and we can see if it is accurate.  See info on bloating below.  Fiber supplement like metamucil or citrucel for constipation. Miralax if needed if you are more constipated or trouble having a bowel movement. Follow up with me in next few weeks if bloating does not improve with above treatments.  Return to the clinic or go to the nearest emergency room if any of your symptoms worsen or new symptoms occur.    Abdominal Bloating When you have abdominal bloating, your abdomen may feel full, tight, or painful. It may also look bigger than normal or swollen (distended). Common causes of abdominal bloating include: Swallowing air. Constipation. Problems digesting food. Eating too much. Irritable bowel syndrome. This is a condition that affects the large intestine. Lactose intolerance. This is an inability to digest lactose, a natural sugar in dairy products. Celiac disease. This is a condition that affects the ability to digest gluten, a protein found in some grains. Gastroparesis. This is a condition that slows down the movement of food in the stomach and small intestine. It is more common in people with diabetes mellitus. Gastroesophageal reflux disease (GERD). This is a condition that makes stomach acid flow back into the esophagus. Urinary retention. This means that the body is holding onto urine, and the bladder cannot be emptied all the way. Follow these instructions at home: Eating and drinking Avoid eating too much. Try not to swallow air while talking or eating. Avoid eating while lying down. Avoid these foods and drinks: Foods that cause gas, such as broccoli, cabbage, cauliflower, and baked beans. Carbonated drinks. Hard candy. Chewing gum. Medicines Take over-the-counter and prescription medicines only  as told by your health care provider. Take probiotic medicines. These medicines contain live bacteria or yeasts that can help digestion. Take coated peppermint oil capsules. General instructions Try to exercise regularly. Exercise may help to relieve bloating that is caused by gas and relieve constipation. Keep all follow-up visits. This is important. Contact a health care provider if: You have nausea and vomiting. You have diarrhea. You have abdominal pain. You have unusual weight loss or weight gain. You have severe pain, and medicines do not help. Get help right away if: You have chest pain. You have trouble breathing. You have shortness of breath. You have trouble urinating. You have darker urine than normal. You have blood in your stools or have dark, tarry stools. These symptoms may represent a serious problem that is an emergency. Do not wait to see if the symptoms will go away. Get medical help right away. Call your local emergency services (911 in the [place] Do not drive yourself to the hospital. Summary Abdominal bloating means that the abdomen is swollen. Common causes of abdominal bloating are swallowing air, constipation, and problems digesting food. Avoid eating too much and avoid swallowing air. Avoid foods that cause gas, carbonated drinks, hard candy, and chewing gum. This information is not intended to replace advice given to you by your health care provider. Make sure you discuss any questions you have with your health care provider. Document Revised: 11/05/2019 Document Reviewed: 11/05/2019 Elsevier Patient Education  2024 [employer]   How to Take Your Blood Pressure Blood pressure is a measurement of how strongly your blood is pressing against the walls of your  arteries. Arteries are blood vessels that carry blood from your heart throughout your body. Your health care provider takes your blood pressure at each office visit. You can also take your own blood  pressure at home with a blood pressure monitor. You may need to take your own blood pressure to: Confirm a diagnosis of high blood pressure (hypertension). Monitor your blood pressure over time. Make sure your blood pressure medicine is working. Supplies needed: Blood pressure monitor. A chair to sit in. This should be a chair where you can sit upright with your back supported. Do not sit on a soft couch or an armchair. Table or desk. Small notebook and pencil or pen. How to prepare To get the most accurate reading, avoid the following for 30 minutes before you check your blood pressure: Drinking caffeine. Drinking alcohol. Eating. Smoking. Exercising. Five minutes before you check your blood pressure: Use the bathroom and urinate so that you have an empty bladder. Sit quietly in a chair. Do not talk. How to take your blood pressure To check your blood pressure, follow the instructions in the manual that came with your blood pressure monitor. If you have a digital blood pressure monitor, the instructions may be as follows: Sit up straight in a chair. Place your feet on the floor. Do not cross your ankles or legs. Rest your left arm at the level of your heart on a table or desk or on the arm of a chair. Pull up your shirt sleeve. Wrap the blood pressure cuff around the upper part of your left arm, 1 inch (2.5 cm) above your elbow. It is best to wrap the cuff around bare skin. Fit the cuff snugly, but not too tightly, around your arm. You should be able to place only one finger between the cuff and your arm. Position the cord so that it rests in the bend of your elbow. Press the power button. Sit quietly while the cuff inflates and deflates. Read the digital reading on the monitor screen and write the numbers down (record them) in a notebook. Wait 2-3 minutes, then repeat the steps, starting at step 1. What does my blood pressure reading mean? A blood pressure reading consists of a  higher number over a lower number. Ideally, your blood pressure should be below 120/80. The first ("top") number is called the systolic pressure. It is a measure of the pressure in your arteries as your heart beats. The second ("bottom") number is called the diastolic pressure. It is a measure of the pressure in your arteries as the heart relaxes. Blood pressure is classified into four stages. The following are the stages for adults who do not have a short-term serious illness or a chronic condition. Systolic pressure and diastolic pressure are measured in a unit called mm Hg (millimeters of mercury).  Normal Systolic pressure: below 120. Diastolic pressure: below 80. Elevated Systolic pressure: 120-129. Diastolic pressure: below 80. Hypertension stage 1 Systolic pressure: 130-139. Diastolic pressure: 80-89. Hypertension stage 2 Systolic pressure: 140 or above. Diastolic pressure: 90 or above. You can have elevated blood pressure or hypertension even if only the systolic or only the diastolic number in your reading is higher than normal. Follow these instructions at home: Medicines Take over-the-counter and prescription medicines only as told by your health care provider. Tell your health care provider if you are having any side effects from blood pressure medicine. General instructions Check your blood pressure as often as recommended by your health care provider. Check  your blood pressure at the same time every day. Take your monitor to the next appointment with your health care provider to make sure that: You are using it correctly. It provides accurate readings. Understand what your goal blood pressure numbers are. Keep all follow-up visits. This is important. General tips Your health care provider can suggest a reliable monitor that will meet your needs. There are several types of home blood pressure monitors. Choose a monitor that has an arm cuff. Do not choose a monitor that  measures your blood pressure from your wrist or finger. Choose a cuff that wraps snugly, not too tight or too loose, around your upper arm. You should be able to fit only one finger between your arm and the cuff. You can buy a blood pressure monitor at most drugstores or online. Where to find more information American Heart Association: www.heart.org Contact a health care provider if: Your blood pressure is consistently high. Your blood pressure is suddenly low. Get help right away if: Your systolic blood pressure is higher than 180. Your diastolic blood pressure is higher than 120. These symptoms may be an emergency. Get help right away. Call 911. Do not wait to see if the symptoms will go away. Do not drive yourself to the hospital. Summary Blood pressure is a measurement of how strongly your blood is pressing against the walls of your arteries. A blood pressure reading consists of a higher number over a lower number. Ideally, your blood pressure should be below 120/80. Check your blood pressure at the same time every day. Avoid caffeine, alcohol, smoking, and exercise for 30 minutes prior to checking your blood pressure. These agents can affect the accuracy of the blood pressure reading. This information is not intended to replace advice given to you by your health care provider. Make sure you discuss any questions you have with your health care provider. Document Revised: 12/17/2020 Document Reviewed: 12/17/2020 Elsevier Patient Education  2024 [employer] Inc.   Managing Anxiety, Adult After being diagnosed with anxiety, you may be relieved to know why you have felt or behaved a certain way. You may also feel overwhelmed about the treatment ahead and what it will mean for your life. With care and support, you can manage your anxiety. How to manage lifestyle changes Understanding the difference between stress and anxiety Although stress can play a role in anxiety, it is not the same as  anxiety. Stress is your body's reaction to life changes and events, both good and bad. Stress is often caused by something external, such as a deadline, test, or competition. It normally goes away after the event has ended and will last just a few hours. But, stress can be ongoing and can lead to more than just stress. Anxiety is caused by something internal, such as imagining a terrible outcome or worrying that something will go wrong that will greatly upset you. Anxiety often does not go away even after the event is over, and it can become a long-term (chronic) worry. Lowering stress and anxiety Talk with your health care provider or a counselor to learn more about lowering anxiety and stress. They may suggest tension-reduction techniques, such as: Music. Spend time creating or listening to music that you enjoy and that inspires you. Mindfulness-based meditation. Practice being aware of your normal breaths while not trying to control your breathing. It can be done while sitting or walking. Centering prayer. Focus on a word, phrase, or sacred image that means something to you and  brings you peace. Deep breathing. Expand your stomach and inhale slowly through your nose. Hold your breath for 3-5 seconds. Then breathe out slowly, letting your stomach muscles relax. Self-talk. Learn to notice and spot thought patterns that lead to anxiety reactions. Change those patterns to thoughts that feel peaceful. Muscle relaxation. Take time to tense muscles and then relax them. Choose a tension-reduction technique that fits your lifestyle and personality. These techniques take time and practice. Set aside 5-15 minutes a day to do them. Specialized therapists can offer counseling and training in these techniques. The training to help with anxiety may be covered by some insurance plans. Other things you can do to manage stress and anxiety include: Keeping a stress diary. This can help you learn what triggers your  reaction and then learn ways to manage your response. Thinking about how you react to certain situations. You may not be able to control everything, but you can control your response. Making time for activities that help you relax and not feeling guilty about spending your time in this way. Doing visual imagery. This involves imagining or creating mental pictures to help you relax. Practicing yoga. Through yoga poses, you can lower tension and relax.  Medicines Medicines for anxiety include: Antidepressant medicines. These are usually prescribed for long-term daily control. Anti-anxiety medicines. These may be added in severe cases, especially when panic attacks occur. When used together, medicines, psychotherapy, and tension-reduction techniques may be the most effective treatment. Relationships Relationships can play a big part in helping you recover. Spend more time connecting with trusted friends and family members. Think about going to couples counseling if you have a partner, taking family education classes, or going to family therapy. Therapy can help you and others better understand your anxiety. How to recognize changes in your anxiety Everyone responds differently to treatment for anxiety. Recovery from anxiety happens when symptoms lessen and stop interfering with your daily life at home or work. This may mean that you will start to: Have better concentration and focus. Worry will interfere less in your daily thinking. Sleep better. Be less irritable. Have more energy. Have improved memory. Try to recognize when your condition is getting worse. Contact your provider if your symptoms interfere with home or work and you feel like your condition is not improving. Follow these instructions at home: Activity Exercise. Adults should: Exercise for at least 150 minutes each week. The exercise should increase your heart rate and make you sweat (moderate-intensity exercise). Do  strengthening exercises at least twice a week. Get the right amount and quality of sleep. Most adults need 7-9 hours of sleep each night. Lifestyle  Eat a healthy diet that includes plenty of vegetables, fruits, whole grains, low-fat dairy products, and lean protein. Do not eat a lot of foods that are high in fats, added sugars, or salt (sodium). Make choices that simplify your life. Do not use any products that contain nicotine or tobacco. These products include cigarettes, chewing tobacco, and vaping devices, such as e-cigarettes. If you need help quitting, ask your provider. Avoid caffeine, alcohol, and certain over-the-counter cold medicines. These may make you feel worse. Ask your pharmacist which medicines to avoid. General instructions Take over-the-counter and prescription medicines only as told by your provider. Keep all follow-up visits. This is to make sure you are managing your anxiety well or if you need more support. Where to find support You can get help and support from: Self-help groups. Online and [employer]. A trusted spiritual  leader. Couples counseling. Family education classes. Family therapy. Where to find more information You may find that joining a support group helps you deal with your anxiety. The following sources can help you find counselors or support groups near you: Mental Health [place]: mentalhealthamerica.net Anxiety and [employer] of [place] (ADAA): adaa.org [employer]ness (NAMI): nami.org Contact a health care provider if: You have a hard time staying focused or finishing tasks. You spend many hours a day feeling worried about everyday life. You are very tired because you cannot stop worrying. You start to have headaches or often feel tense. You have chronic nausea or diarrhea. Get help right away if: Your heart feels like it is racing. You have shortness of breath. You have thoughts of hurting  yourself or others. Get help right away if you feel like you may hurt yourself or others, or have thoughts about taking your own life. Go to your nearest emergency room or: Call 911. Call the National Suicide Prevention Lifeline at (336)814-8309 or 988. This is open 24 hours a day. Text the Crisis Text Line at 980-100-6233. This information is not intended to replace advice given to you by your health care provider. Make sure you discuss any questions you have with your health care provider. Document Revised: 01/11/2022 Document Reviewed: 07/26/2020 Elsevier Patient Education  2024 [employer].

## 2023-03-22 NOTE — Progress Notes (Signed)
Subjective:  Patient ID: Micheal Roberts, male    DOB: 25-May-1970  Age: 52 y.o. MRN: [id number]  CC:  Chief Complaint  Patient presents with   Medical Management of Chronic Issues    Pt here for 2 week follow up. No questions today    HPI Micheal Roberts presents for   Situational anxiety  -Intermittent anxiety with travel discussed at his October visit.  Hydroxyzine prescribed if needed and handout given on management of anxiety. Never needed the hydroxyzine - air travel went well - did not have any issues. Minimal anxiety at times, but not overwhelming and overall anxiety is better. No new coping strategies. Has handout from last visit.   Abdominal Bloating: For awhile - past year. Takes antacid every morning - prilosec. No heartburn.  No n/v.  No bowel changes. Some chronic hard stools - daily BM, sometimes smaller BM. No current treatments.   Hypertension: Decreased control at October visit.  Amlodipine was increased to 5 mg daily.  Had also been recently started on CPAP at that time for OSA.no new effects of meds. Has appt with sleep specialist soon. Feels better on cpap - may need adjustment.  Home readings: variable. No readings in office - high at times.  BP Readings from Last 3 Encounters:  03/22/23 124/76  02/02/23 (!) 146/100  08/19/22 112/88   Lab Results  Component Value Date   CREATININE 1.24 02/02/2023   Urinary hesitancy New concern in October.  Reported intermittent symptoms with difficulty in onset or hesitancy but no retention, urgency, hematuria at that time.  PSA level was normal and stable from previous readings. Doing well now. Prior sx's resolved. Normal urinary flow now. Back to baseline. Occasional slight burning. No new sexual contacts. No penile discharge.   Lab Results  Component Value Date   PSA1 0.2 04/05/2018   PSA 0.17 02/02/2023   PSA 0.14 06/22/2022   PSA 0.15 09/03/2020        History Patient Active Problem List   Diagnosis  Date Noted   Hypertension, essential 08/07/2022   Acute prostatitis 06/22/2022   IBS (irritable bowel syndrome)    Past Medical History:  Diagnosis Date   Anxiety    IBS (irritable bowel syndrome)    Past Surgical History:  Procedure Laterality Date   left ACL arthroscopy     Allergies  Allergen Reactions   Benzonatate Anaphylaxis   Prior to Admission medications   Medication Sig Start Date End Date Taking? Authorizing Provider  amLODipine (NORVASC) 5 MG tablet Take 1 tablet (5 mg total) by mouth daily. 02/02/23  Yes Shade Flood, MD  hydrOXYzine (ATARAX) 10 MG tablet Take 1 tablet (10 mg total) by mouth 3 (three) times daily as needed. 02/05/23  Yes Shade Flood, MD  omeprazole (PRILOSEC) 40 MG capsule Take 1 capsule (40 mg total) by mouth daily. Patient needs follow up appointment for future refills. Please call [phone] to schedule an appointment. 09/09/22  Yes Meryl Dare, MD   Social History   Socioeconomic History   Marital status: Married    Spouse name: Not on file   Number of children: Not on file   Years of education: Not on file   Highest education level: Not on file  Occupational History   Not on file  Tobacco Use   Smoking status: Former    Current packs/day: 0.00    Average packs/day: 0.5 packs/day for 10.0 years (5.0 ttl pk-yrs)    Types: Cigarettes  Start date: 2008    Quit date: 2018    Years since quitting: 6.9   Smokeless tobacco: Never  Vaping Use   Vaping status: Never Used  Substance and Sexual Activity   Alcohol use: Yes    Alcohol/week: 0.0 standard drinks of alcohol    Comment: beer - only on ocassions   Drug use: No   Sexual activity: Yes  Other Topics Concern   Not on file  Social History Narrative   Caffeine: 1-2 cup daily.   Education:  one yr college.  (Did not finish).   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     Social Determinants of Health   Financial Resource Strain: Not on file  Food Insecurity: Not on file   Transportation Needs: Not on file  Physical Activity: Not on file  Stress: Not on file  Social Connections: Not on file  Intimate Partner Violence: Not on file    Review of Systems  Constitutional:  Negative for fatigue and unexpected weight change.  Eyes:  Negative for visual disturbance.  Respiratory:  Negative for cough, chest tightness and shortness of breath.   Cardiovascular:  Negative for chest pain, palpitations and leg swelling.  Gastrointestinal:  Negative for abdominal pain and blood in stool.  Neurological:  Negative for dizziness, light-headedness and headaches.     Objective:   Vitals:   03/22/23 1315  BP: 124/76  Pulse: 73  Temp: (!) 97.4 F (36.3 C)  TempSrc: Temporal  SpO2: 97%  Weight: 244 lb 6.4 oz (110.9 kg)  Height: 6\' 1"  (1.854 m)     Physical Exam Vitals reviewed.  Constitutional:      Appearance: He is well-developed.  HENT:     Head: Normocephalic and atraumatic.  Neck:     Vascular: No carotid bruit or JVD.  Cardiovascular:     Rate and Rhythm: Normal rate and regular rhythm.     Heart sounds: Normal heart sounds. No murmur heard. Pulmonary:     Effort: Pulmonary effort is normal.     Breath sounds: Normal breath sounds. No rales.  Abdominal:     Comments: Minimal slight discomfort but no pain upper abdomen, no lower abdominal pain rebound or guarding.  No CVA tenderness.  Musculoskeletal:     Right lower leg: No edema.     Left lower leg: No edema.  Skin:    General: Skin is warm and dry.  Neurological:     Mental Status: He is alert and oriented to person, place, and time.  Psychiatric:        Mood and Affect: Mood normal.        Behavior: Behavior normal.        Thought Content: Thought content normal.       Assessment & Plan:  Micheal Roberts is a 52 y.o. male . Dysuria - Plan: POCT urinalysis dipstick, Urine cytology ancillary only  -Minimal intermittent symptoms.  Check urine testing as above and adjust plan  accordingly.  RTC precautions.  Abdominal bloating  -Overall reassuring exam, handout given on bloating including discussion of foods that can contribute to bloating with RTC precautions if symptoms persist or new/worsening symptoms.  Constipation treatment discussed as that can also contribute to bloating.  OSA on CPAP  -Keep follow-up with sleep specialist as planned.  Improved on treatment.  Hypertension, essential  -Stable in office, no med changes at this time.  Situational anxiety  -Overall stable, handout given on management of anxiety with RTC precautions if worsening symptoms,  no new meds for now.  No orders of the defined types were placed in this encounter.  Patient Instructions  Glad to hear you are better.  See info on managing anxiety - let me know if that increases.  Blood pressure ok here. See info on checking that at home and return with your monitor and we can see if it is accurate.  See info on bloating below.  Fiber supplement like metamucil or citrucel for constipation. Miralax if needed if you are more constipated or trouble having a bowel movement. Follow up with me in next few weeks if bloating does not improve with above treatments.  Return to the clinic or go to the nearest emergency room if any of your symptoms worsen or new symptoms occur.    Abdominal Bloating When you have abdominal bloating, your abdomen may feel full, tight, or painful. It may also look bigger than normal or swollen (distended). Common causes of abdominal bloating include: Swallowing air. Constipation. Problems digesting food. Eating too much. Irritable bowel syndrome. This is a condition that affects the large intestine. Lactose intolerance. This is an inability to digest lactose, a natural sugar in dairy products. Celiac disease. This is a condition that affects the ability to digest gluten, a protein found in some grains. Gastroparesis. This is a condition that slows down the  movement of food in the stomach and small intestine. It is more common in people with diabetes mellitus. Gastroesophageal reflux disease (GERD). This is a condition that makes stomach acid flow back into the esophagus. Urinary retention. This Roberts that the body is holding onto urine, and the bladder cannot be emptied all the way. Follow these instructions at home: Eating and drinking Avoid eating too much. Try not to swallow air while talking or eating. Avoid eating while lying down. Avoid these foods and drinks: Foods that cause gas, such as broccoli, cabbage, cauliflower, and baked beans. Carbonated drinks. Hard candy. Chewing gum. Medicines Take over-the-counter and prescription medicines only as told by your health care provider. Take probiotic medicines. These medicines contain live bacteria or yeasts that can help digestion. Take coated peppermint oil capsules. General instructions Try to exercise regularly. Exercise may help to relieve bloating that is caused by gas and relieve constipation. Keep all follow-up visits. This is important. Contact a health care provider if: You have nausea and vomiting. You have diarrhea. You have abdominal pain. You have unusual weight loss or weight gain. You have severe pain, and medicines do not help. Get help right away if: You have chest pain. You have trouble breathing. You have shortness of breath. You have trouble urinating. You have darker urine than normal. You have blood in your stools or have dark, tarry stools. These symptoms may represent a serious problem that is an emergency. Do not wait to see if the symptoms will go away. Get medical help right away. Call your local emergency services (911 in the [place] Do not drive yourself to the hospital. Summary Abdominal bloating Roberts that the abdomen is swollen. Common causes of abdominal bloating are swallowing air, constipation, and problems digesting food. Avoid eating too much  and avoid swallowing air. Avoid foods that cause gas, carbonated drinks, hard candy, and chewing gum. This information is not intended to replace advice given to you by your health care provider. Make sure you discuss any questions you have with your health care provider. Document Revised: 11/05/2019 Document Reviewed: 11/05/2019 Elsevier Patient Education  2024 [employer].   How  to Take Your Blood Pressure Blood pressure is a measurement of how strongly your blood is pressing against the walls of your arteries. Arteries are blood vessels that carry blood from your heart throughout your body. Your health care provider takes your blood pressure at each office visit. You can also take your own blood pressure at home with a blood pressure monitor. You may need to take your own blood pressure to: Confirm a diagnosis of high blood pressure (hypertension). Monitor your blood pressure over time. Make sure your blood pressure medicine is working. Supplies needed: Blood pressure monitor. A chair to sit in. This should be a chair where you can sit upright with your back supported. Do not sit on a soft couch or an armchair. Table or desk. Small notebook and pencil or pen. How to prepare To get the most accurate reading, avoid the following for 30 minutes before you check your blood pressure: Drinking caffeine. Drinking alcohol. Eating. Smoking. Exercising. Five minutes before you check your blood pressure: Use the bathroom and urinate so that you have an empty bladder. Sit quietly in a chair. Do not talk. How to take your blood pressure To check your blood pressure, follow the instructions in the manual that came with your blood pressure monitor. If you have a digital blood pressure monitor, the instructions may be as follows: Sit up straight in a chair. Place your feet on the floor. Do not cross your ankles or legs. Rest your left arm at the level of your heart on a table or desk or on the  arm of a chair. Pull up your shirt sleeve. Wrap the blood pressure cuff around the upper part of your left arm, 1 inch (2.5 cm) above your elbow. It is best to wrap the cuff around bare skin. Fit the cuff snugly, but not too tightly, around your arm. You should be able to place only one finger between the cuff and your arm. Position the cord so that it rests in the bend of your elbow. Press the power button. Sit quietly while the cuff inflates and deflates. Read the digital reading on the monitor screen and write the numbers down (record them) in a notebook. Wait 2-3 minutes, then repeat the steps, starting at step 1. What does my blood pressure reading mean? A blood pressure reading consists of a higher number over a lower number. Ideally, your blood pressure should be below 120/80. The first ("top") number is called the systolic pressure. It is a measure of the pressure in your arteries as your heart beats. The second ("bottom") number is called the diastolic pressure. It is a measure of the pressure in your arteries as the heart relaxes. Blood pressure is classified into four stages. The following are the stages for adults who do not have a short-term serious illness or a chronic condition. Systolic pressure and diastolic pressure are measured in a unit called mm Hg (millimeters of mercury).  Normal Systolic pressure: below 120. Diastolic pressure: below 80. Elevated Systolic pressure: 120-129. Diastolic pressure: below 80. Hypertension stage 1 Systolic pressure: 130-139. Diastolic pressure: 80-89. Hypertension stage 2 Systolic pressure: 140 or above. Diastolic pressure: 90 or above. You can have elevated blood pressure or hypertension even if only the systolic or only the diastolic number in your reading is higher than normal. Follow these instructions at home: Medicines Take over-the-counter and prescription medicines only as told by your health care provider. Tell your health care  provider if you are having any  side effects from blood pressure medicine. General instructions Check your blood pressure as often as recommended by your health care provider. Check your blood pressure at the same time every day. Take your monitor to the next appointment with your health care provider to make sure that: You are using it correctly. It provides accurate readings. Understand what your goal blood pressure numbers are. Keep all follow-up visits. This is important. General tips Your health care provider can suggest a reliable monitor that will meet your needs. There are several types of home blood pressure monitors. Choose a monitor that has an arm cuff. Do not choose a monitor that measures your blood pressure from your wrist or finger. Choose a cuff that wraps snugly, not too tight or too loose, around your upper arm. You should be able to fit only one finger between your arm and the cuff. You can buy a blood pressure monitor at most drugstores or online. Where to find more information American Heart Association: www.heart.org Contact a health care provider if: Your blood pressure is consistently high. Your blood pressure is suddenly low. Get help right away if: Your systolic blood pressure is higher than 180. Your diastolic blood pressure is higher than 120. These symptoms may be an emergency. Get help right away. Call 911. Do not wait to see if the symptoms will go away. Do not drive yourself to the hospital. Summary Blood pressure is a measurement of how strongly your blood is pressing against the walls of your arteries. A blood pressure reading consists of a higher number over a lower number. Ideally, your blood pressure should be below 120/80. Check your blood pressure at the same time every day. Avoid caffeine, alcohol, smoking, and exercise for 30 minutes prior to checking your blood pressure. These agents can affect the accuracy of the blood pressure reading. This  information is not intended to replace advice given to you by your health care provider. Make sure you discuss any questions you have with your health care provider. Document Revised: 12/17/2020 Document Reviewed: 12/17/2020 Elsevier Patient Education  2024 Elsevier Inc.   Managing Anxiety, Adult After being diagnosed with anxiety, you may be relieved to know why you have felt or behaved a certain way. You may also feel overwhelmed about the treatment ahead and what it will mean for your life. With care and support, you can manage your anxiety. How to manage lifestyle changes Understanding the difference between stress and anxiety Although stress can play a role in anxiety, it is not the same as anxiety. Stress is your body's reaction to life changes and events, both good and bad. Stress is often caused by something external, such as a deadline, test, or competition. It normally goes away after the event has ended and will last just a few hours. But, stress can be ongoing and can lead to more than just stress. Anxiety is caused by something internal, such as imagining a terrible outcome or worrying that something will go wrong that will greatly upset you. Anxiety often does not go away even after the event is over, and it can become a long-term (chronic) worry. Lowering stress and anxiety Talk with your health care provider or a counselor to learn more about lowering anxiety and stress. They may suggest tension-reduction techniques, such as: Music. Spend time creating or listening to music that you enjoy and that inspires you. Mindfulness-based meditation. Practice being aware of your normal breaths while not trying to control your breathing. It can be  done while sitting or walking. Centering prayer. Focus on a word, phrase, or sacred image that Roberts something to you and brings you peace. Deep breathing. Expand your stomach and inhale slowly through your nose. Hold your breath for 3-5 seconds. Then  breathe out slowly, letting your stomach muscles relax. Self-talk. Learn to notice and spot thought patterns that lead to anxiety reactions. Change those patterns to thoughts that feel peaceful. Muscle relaxation. Take time to tense muscles and then relax them. Choose a tension-reduction technique that fits your lifestyle and personality. These techniques take time and practice. Set aside 5-15 minutes a day to do them. Specialized therapists can offer counseling and training in these techniques. The training to help with anxiety may be covered by some insurance plans. Other things you can do to manage stress and anxiety include: Keeping a stress diary. This can help you learn what triggers your reaction and then learn ways to manage your response. Thinking about how you react to certain situations. You may not be able to control everything, but you can control your response. Making time for activities that help you relax and not feeling guilty about spending your time in this way. Doing visual imagery. This involves imagining or creating mental pictures to help you relax. Practicing yoga. Through yoga poses, you can lower tension and relax.  Medicines Medicines for anxiety include: Antidepressant medicines. These are usually prescribed for long-term daily control. Anti-anxiety medicines. These may be added in severe cases, especially when panic attacks occur. When used together, medicines, psychotherapy, and tension-reduction techniques may be the most effective treatment. Relationships Relationships can play a big part in helping you recover. Spend more time connecting with trusted friends and family members. Think about going to couples counseling if you have a partner, taking family education classes, or going to family therapy. Therapy can help you and others better understand your anxiety. How to recognize changes in your anxiety Everyone responds differently to treatment for anxiety.  Recovery from anxiety happens when symptoms lessen and stop interfering with your daily life at home or work. This may mean that you will start to: Have better concentration and focus. Worry will interfere less in your daily thinking. Sleep better. Be less irritable. Have more energy. Have improved memory. Try to recognize when your condition is getting worse. Contact your provider if your symptoms interfere with home or work and you feel like your condition is not improving. Follow these instructions at home: Activity Exercise. Adults should: Exercise for at least 150 minutes each week. The exercise should increase your heart rate and make you sweat (moderate-intensity exercise). Do strengthening exercises at least twice a week. Get the right amount and quality of sleep. Most adults need 7-9 hours of sleep each night. Lifestyle  Eat a healthy diet that includes plenty of vegetables, fruits, whole grains, low-fat dairy products, and lean protein. Do not eat a lot of foods that are high in fats, added sugars, or salt (sodium). Make choices that simplify your life. Do not use any products that contain nicotine or tobacco. These products include cigarettes, chewing tobacco, and vaping devices, such as e-cigarettes. If you need help quitting, ask your provider. Avoid caffeine, alcohol, and certain over-the-counter cold medicines. These may make you feel worse. Ask your pharmacist which medicines to avoid. General instructions Take over-the-counter and prescription medicines only as told by your provider. Keep all follow-up visits. This is to make sure you are managing your anxiety well or if you need more  support. Where to find support You can get help and support from: Self-help groups. Online and [employer]. A trusted spiritual leader. Couples counseling. Family education classes. Family therapy. Where to find more information You may find that joining a support group helps  you deal with your anxiety. The following sources can help you find counselors or support groups near you: Mental Health America: mentalhealthamerica.net Anxiety and Depression Association of [place] (ADAA): adaa.org The [employer] on Mental Illness (NAMI): nami.org Contact a health care provider if: You have a hard time staying focused or finishing tasks. You spend many hours a day feeling worried about everyday life. You are very tired because you cannot stop worrying. You start to have headaches or often feel tense. You have chronic nausea or diarrhea. Get help right away if: Your heart feels like it is racing. You have shortness of breath. You have thoughts of hurting yourself or others. Get help right away if you feel like you may hurt yourself or others, or have thoughts about taking your own life. Go to your nearest emergency room or: Call 911. Call the National Suicide Prevention Lifeline at 4406266218 or 988. This is open 24 hours a day. Text the Crisis Text Line at (903)671-0676. This information is not intended to replace advice given to you by your health care provider. Make sure you discuss any questions you have with your health care provider. Document Revised: 01/11/2022 Document Reviewed: 07/26/2020 Elsevier Patient Education  2024 Elsevier Inc.       Signed,   Meredith Staggers, MD [facility], [facility] 03/22/23 1:58 PM

## 2023-03-24 LAB — URINE CYTOLOGY ANCILLARY ONLY
Chlamydia: NEGATIVE
Comment: NEGATIVE
Comment: NEGATIVE
Comment: NORMAL
Neisseria Gonorrhea: NEGATIVE
Trichomonas: NEGATIVE

## 2023-03-25 ENCOUNTER — Encounter: Payer: Self-pay | Admitting: Family Medicine

## 2023-04-04 ENCOUNTER — Encounter: Payer: Self-pay | Admitting: Nurse Practitioner

## 2023-04-04 ENCOUNTER — Ambulatory Visit: Payer: BC Managed Care – PPO | Admitting: Nurse Practitioner

## 2023-04-04 VITALS — BP 124/88 | HR 79 | Ht 73.0 in | Wt 247.0 lb

## 2023-04-04 DIAGNOSIS — G478 Other sleep disorders: Secondary | ICD-10-CM

## 2023-04-04 DIAGNOSIS — G4733 Obstructive sleep apnea (adult) (pediatric): Secondary | ICD-10-CM | POA: Diagnosis not present

## 2023-04-04 DIAGNOSIS — F5101 Primary insomnia: Secondary | ICD-10-CM

## 2023-04-04 DIAGNOSIS — G4700 Insomnia, unspecified: Secondary | ICD-10-CM

## 2023-04-04 MED ORDER — ESZOPICLONE 1 MG PO TABS: 1.0000 mg | ORAL_TABLET | Freq: Every evening | ORAL | 0 refills | Status: DC | PRN

## 2023-04-04 NOTE — Progress Notes (Signed)
@Patient  ID: Micheal Roberts, male    DOB: 1971/02/06, 52 y.o.   MRN: [id number]  Chief Complaint  Patient presents with   Follow-up    CPAP F/U visit    Referring provider: Shade Flood, MD  HPI: 52 year old male, former smoker followed for OSA on CPAP. He is a patient of Dr. Trena Platt and last seen in office 08/19/2022. Past medical history significant for HTN, IBS.   TEST/EVENTS:  09/19/2022 HST: AHI 46.8, SpO2 low 78%  08/19/2022: OV with Dr. Wynona Neat. Has been told he stops breathing. Some gasping/choking. Longstanding history of snoring. Takes hours to fall asleep. About 4 awakenings. Recently started on antihypertensives. HST ordered.   04/04/2023: Today - follow up Patient presents today for follow-up.  He was started on CPAP about 4 months ago.  Feeling like he is sleeping better with it.  Does notice a difference on the nights that he does not sleep with it.  He still struggles with sleep latency.  Can take him hours to fall asleep some nights.  This causes him to not have enough sleep at night.  He usually averages about 5 to 6 hours.  Still feels like his energy levels are not the best during the day.  He does occasionally feel like he does not get enough air when wearing the CPAP.  Wearing a fullface mask.  Denies any issues with drowsy driving, morning headaches or sleep parasomnia/paralysis.  He has tried over-the-counter sleep aids which have not been very effective.  He does have hydroxyzine for anxiety but this does not seem to really help him fall asleep at night.  Does not take it on a consistent basis.  03/04/2023-04/02/2023: CPAP 5-20 cmH2O 27/30 days; 83% >4 hr; average use 5 hr 51 min Pressure 12.7 Leaks 95th 13.7 AHI 3.5   Allergies  Allergen Reactions   Benzonatate Anaphylaxis    Immunization History  Administered Date(s) Administered   Influenza, Seasonal, Injecte, Preservative Fre 02/02/2023   Influenza,inj,Quad PF,6+ Mos 05/25/2015, 06/21/2016,  04/05/2018, 04/25/2019, 03/18/2021, 02/24/2022   Tdap 09/03/2020   Zoster Recombinant(Shingrix) 03/18/2021    Past Medical History:  Diagnosis Date   Anxiety    IBS (irritable bowel syndrome)     Tobacco History: Social History   Tobacco Use  Smoking Status Former   Current packs/day: 0.00   Average packs/day: 0.5 packs/day for 10.0 years (5.0 ttl pk-yrs)   Types: Cigarettes   Start date: 2008   Quit date: 2018   Years since quitting: 6.9  Smokeless Tobacco Never   Counseling given: Not Answered   Outpatient Medications Prior to Visit  Medication Sig Dispense Refill   amLODipine (NORVASC) 5 MG tablet Take 1 tablet (5 mg total) by mouth daily. 90 tablet 1   hydrOXYzine (ATARAX) 10 MG tablet Take 1 tablet (10 mg total) by mouth 3 (three) times daily as needed. 30 tablet 0   omeprazole (PRILOSEC) 40 MG capsule Take 1 capsule (40 mg total) by mouth daily. Patient needs follow up appointment for future refills. Please call [phone] to schedule an appointment. 90 capsule 0   No facility-administered medications prior to visit.     Review of Systems:   Constitutional: No weight loss or gain, night sweats, fevers, chills, or lassitude. +fatigue  HEENT: No headaches, difficulty swallowing, tooth/dental problems, or sore throat. No sneezing, itching, ear ache, nasal congestion, or post nasal drip CV:  No chest pain, orthopnea, PND, swelling in lower extremities, anasarca, dizziness, palpitations, syncope Resp:  No shortness of breath with exertion or at rest. No excess mucus or change in color of mucus. No productive or non-productive. No hemoptysis. No wheezing.  No chest wall deformity GI:  No heartburn, indigestion GU: No dysuria, change in color of urine, urgency or frequency.   Skin: No rash, lesions, ulcerations MSK:  No joint pain or swelling.  Neuro: No dizziness or lightheadedness.  Psych: No depression or anxiety. Mood stable. +sleep disturbance     Physical  Exam:  BP 124/88 (BP Location: Right Arm, Cuff Size: Large)   Pulse 79   Ht 6\' 1"  (1.854 m)   Wt 247 lb (112 kg)   SpO2 95%   BMI 32.59 kg/m   GEN: Pleasant, interactive, well-appearing; in no acute distress HEENT:  Normocephalic and atraumatic. PERRLA. Sclera white. Nasal turbinates pink, moist and patent bilaterally. No rhinorrhea present. Oropharynx pink and moist, without exudate or edema. No lesions, ulcerations, or postnasal drip.  NECK:  Supple w/ fair ROM. No JVD present. Normal carotid impulses w/o bruits. Thyroid symmetrical with no goiter or nodules palpated. No lymphadenopathy.   CV: RRR, no m/r/g, no peripheral edema. Pulses intact, +2 bilaterally. No cyanosis, pallor or clubbing. PULMONARY:  Unlabored, regular breathing. Clear bilaterally A&P w/o wheezes/rales/rhonchi. No accessory muscle use.  GI: BS present and normoactive. Soft, non-tender to palpation. No organomegaly or masses detected. MSK: No erythema, warmth or tenderness. Cap refil <2 sec all extrem. No deformities or joint swelling noted.  Neuro: A/Ox3. No focal deficits noted.   Skin: Warm, no lesions or rashe Psych: Normal affect and behavior. Judgement and thought content appropriate.     Lab Results:  CBC    Component Value Date/Time   WBC 5.6 06/22/2022 1635   RBC 4.58 06/22/2022 1635   HGB 13.5 06/22/2022 1635   HGB 13.3 04/25/2019 1423   HCT 40.6 06/22/2022 1635   HCT 39.4 04/25/2019 1423   PLT 268.0 06/22/2022 1635   PLT 254 04/25/2019 1423   MCV 88.5 06/22/2022 1635   MCV 90 04/25/2019 1423   MCH 30.2 04/25/2019 1423   MCH 29.7 07/25/2013 1138   MCHC 33.2 06/22/2022 1635   RDW 13.5 06/22/2022 1635   RDW 11.9 04/25/2019 1423   LYMPHSABS 1.7 06/22/2022 1635   MONOABS 0.3 06/22/2022 1635   EOSABS 0.2 06/22/2022 1635   BASOSABS 0.1 06/22/2022 1635    BMET    Component Value Date/Time   NA 139 02/02/2023 1350   NA 139 04/25/2019 1423   K 3.8 02/02/2023 1350   CL 98 02/02/2023 1350    CO2 32 02/02/2023 1350   GLUCOSE 78 02/02/2023 1350   BUN 8 02/02/2023 1350   BUN 12 04/25/2019 1423   CREATININE 1.24 02/02/2023 1350   CREATININE 1.11 07/25/2013 1138   CALCIUM 9.5 02/02/2023 1350   GFRNONAA 59 (L) 04/25/2019 1423   GFRAA 68 04/25/2019 1423    BNP No results found for: "BNP"   Imaging:  No results found.  Administration History     None           No data to display          No results found for: "NITRICOXIDE"      Assessment & Plan:   OSA on CPAP Severe OSA on CPAP.  Started therapy about 4 months ago.  Good compliance and control.  Will tighten his pressure settings to 10 to 15 cm water.  Reassess at follow-up.  Understands proper use/care of device.  Reviewed risks of untreated sleep apnea.  Encouraged to continue wearing nightly.  Aware of safe driving practices.  Patient Instructions  Continue to use CPAP every night, minimum of 4-6 hours a night.  Change equipment as directed. Wash your tubing with warm soap and water daily, hang to dry. Wash humidifier portion weekly. Use bottled, distilled water and change daily Be aware of reduced alertness and do not drive or operate heavy machinery if experiencing this or drowsiness.  Exercise encouraged, as tolerated. Healthy weight management discussed.  Avoid or decrease alcohol consumption and medications that make you more sleepy, if possible. Notify if persistent daytime sleepiness occurs even with consistent use of PAP therapy.  We discussed how untreated sleep apnea puts an individual at risk for cardiac arrhthymias, pulm HTN, DM, stroke and increases their risk for daytime accidents.   Start lunesta 1 mg At bedtime as needed for sleep. Try this for a few nights and if it doesn't help you fall and stay asleep, you can go up to 2 mg. If you do this for a few nights and still no success, we can try 3 mg. Take immediately before bed. Ensure you have 7-8 hours in the bed after taking. Monitor for  any mood changes or changes in sleep habits. Stop and notify immediately if these occur. Do not drive or operate heavy machinery after taking. Do not take with alcohol or other sedating medications. Ensure you apply your CPAP within 5-10 minutes of taking to avoid falling asleep without it. May cause some morning grogginess or vivid dreams.   Adjust CPAP settings to 10-15 cmH2O  Follow up in 6-8 weeks with Dr. Wynona Neat or Katie Jamin Humphries,NP. If symptoms do not improve or worsen, please contact office for sooner follow up or seek emergency care.    Insomnia Persistent issues with sleep latency despite OSA being adequately treated.  This is contributing to sleep insufficiency and associated daytime sleepiness.  Will start him on sleep aid with Lunesta.  Medication education and side effect profile reviewed.  Understands to not drive or operate any heavy machinery after taking.  Will monitor for any mood changes or changes in sleep habits.  Aware to apply CPAP once he takes medication.  Reassess response at follow-up.  Sleep hygiene reviewed.   Advised if symptoms do not improve or worsen, to please contact office for sooner follow up or seek emergency care.   I spent 32 minutes of dedicated to the care of this patient on the date of this encounter to include pre-visit review of records, face-to-face time with the patient discussing conditions above, post visit ordering of testing, clinical documentation with the electronic health record, making appropriate referrals as documented, and communicating necessary findings to members of the patients care team.  Noemi Chapel, NP 04/04/2023  Pt aware and understands NP's role.

## 2023-04-04 NOTE — Assessment & Plan Note (Signed)
Severe OSA on CPAP.  Started therapy about 4 months ago.  Good compliance and control.  Will tighten his pressure settings to 10 to 15 cm water.  Reassess at follow-up.  Understands proper use/care of device.  Reviewed risks of untreated sleep apnea.  Encouraged to continue wearing nightly.  Aware of safe driving practices.  Patient Instructions  Continue to use CPAP every night, minimum of 4-6 hours a night.  Change equipment as directed. Wash your tubing with warm soap and water daily, hang to dry. Wash humidifier portion weekly. Use bottled, distilled water and change daily Be aware of reduced alertness and do not drive or operate heavy machinery if experiencing this or drowsiness.  Exercise encouraged, as tolerated. Healthy weight management discussed.  Avoid or decrease alcohol consumption and medications that make you more sleepy, if possible. Notify if persistent daytime sleepiness occurs even with consistent use of PAP therapy.  We discussed how untreated sleep apnea puts an individual at risk for cardiac arrhthymias, pulm HTN, DM, stroke and increases their risk for daytime accidents.   Start lunesta 1 mg At bedtime as needed for sleep. Try this for a few nights and if it doesn't help you fall and stay asleep, you can go up to 2 mg. If you do this for a few nights and still no success, we can try 3 mg. Take immediately before bed. Ensure you have 7-8 hours in the bed after taking. Monitor for any mood changes or changes in sleep habits. Stop and notify immediately if these occur. Do not drive or operate heavy machinery after taking. Do not take with alcohol or other sedating medications. Ensure you apply your CPAP within 5-10 minutes of taking to avoid falling asleep without it. May cause some morning grogginess or vivid dreams.   Adjust CPAP settings to 10-15 cmH2O  Follow up in 6-8 weeks with Dr. Wynona Neat or Katie Artavis Cowie,NP. If symptoms do not improve or worsen, please contact office for  sooner follow up or seek emergency care.

## 2023-04-04 NOTE — Patient Instructions (Addendum)
Continue to use CPAP every night, minimum of 4-6 hours a night.  Change equipment as directed. Wash your tubing with warm soap and water daily, hang to dry. Wash humidifier portion weekly. Use bottled, distilled water and change daily Be aware of reduced alertness and do not drive or operate heavy machinery if experiencing this or drowsiness.  Exercise encouraged, as tolerated. Healthy weight management discussed.  Avoid or decrease alcohol consumption and medications that make you more sleepy, if possible. Notify if persistent daytime sleepiness occurs even with consistent use of PAP therapy.  We discussed how untreated sleep apnea puts an individual at risk for cardiac arrhthymias, pulm HTN, DM, stroke and increases their risk for daytime accidents.   Start lunesta 1 mg At bedtime as needed for sleep. Try this for a few nights and if it doesn't help you fall and stay asleep, you can go up to 2 mg. If you do this for a few nights and still no success, we can try 3 mg. Take immediately before bed. Ensure you have 7-8 hours in the bed after taking. Monitor for any mood changes or changes in sleep habits. Stop and notify immediately if these occur. Do not drive or operate heavy machinery after taking. Do not take with alcohol or other sedating medications. Ensure you apply your CPAP within 5-10 minutes of taking to avoid falling asleep without it. May cause some morning grogginess or vivid dreams.   Adjust CPAP settings to 10-15 cmH2O  Follow up in 6-8 weeks with Dr. Wynona Neat or Katie Ellasyn Swilling,NP. If symptoms do not improve or worsen, please contact office for sooner follow up or seek emergency care.

## 2023-04-04 NOTE — Assessment & Plan Note (Signed)
Persistent issues with sleep latency despite OSA being adequately treated.  This is contributing to sleep insufficiency and associated daytime sleepiness.  Will start him on sleep aid with Lunesta.  Medication education and side effect profile reviewed.  Understands to not drive or operate any heavy machinery after taking.  Will monitor for any mood changes or changes in sleep habits.  Aware to apply CPAP once he takes medication.  Reassess response at follow-up.  Sleep hygiene reviewed.

## 2023-05-03 ENCOUNTER — Ambulatory Visit: Payer: BC Managed Care – PPO | Admitting: Family Medicine

## 2023-05-18 ENCOUNTER — Ambulatory Visit (INDEPENDENT_AMBULATORY_CARE_PROVIDER_SITE_OTHER): Payer: Self-pay | Admitting: Family Medicine

## 2023-05-18 ENCOUNTER — Encounter: Payer: Self-pay | Admitting: Family Medicine

## 2023-05-18 ENCOUNTER — Ambulatory Visit: Payer: BC Managed Care – PPO | Admitting: Nurse Practitioner

## 2023-05-18 VITALS — BP 128/80 | HR 75 | Temp 98.9°F | Ht 73.0 in | Wt 248.2 lb

## 2023-05-18 DIAGNOSIS — R7989 Other specified abnormal findings of blood chemistry: Secondary | ICD-10-CM | POA: Diagnosis not present

## 2023-05-18 DIAGNOSIS — R140 Abdominal distension (gaseous): Secondary | ICD-10-CM | POA: Diagnosis not present

## 2023-05-18 DIAGNOSIS — K5900 Constipation, unspecified: Secondary | ICD-10-CM

## 2023-05-18 DIAGNOSIS — G4733 Obstructive sleep apnea (adult) (pediatric): Secondary | ICD-10-CM

## 2023-05-18 DIAGNOSIS — I10 Essential (primary) hypertension: Secondary | ICD-10-CM | POA: Diagnosis not present

## 2023-05-18 LAB — COMPREHENSIVE METABOLIC PANEL
ALT: 112 U/L — ABNORMAL HIGH (ref 0–53)
AST: 67 U/L — ABNORMAL HIGH (ref 0–37)
Albumin: 4.5 g/dL (ref 3.5–5.2)
Alkaline Phosphatase: 63 U/L (ref 39–117)
BUN: 11 mg/dL (ref 6–23)
CO2: 31 meq/L (ref 19–32)
Calcium: 9.4 mg/dL (ref 8.4–10.5)
Chloride: 100 meq/L (ref 96–112)
Creatinine, Ser: 1.37 mg/dL (ref 0.40–1.50)
GFR: 59.26 mL/min — ABNORMAL LOW (ref >60.00)
Glucose, Bld: 77 mg/dL (ref 70–99)
Potassium: 3.7 meq/L (ref 3.5–5.1)
Sodium: 139 meq/L (ref 135–145)
Total Bilirubin: 0.6 mg/dL (ref 0.2–1.2)
Total Protein: 7.5 g/dL (ref 6.0–8.3)

## 2023-05-18 NOTE — Patient Instructions (Addendum)
Try miralax once or twice to help with bowel regimen, continue fiber supplement - option of additional dose or more fiber in diet (fruits, vegetables, whole grains). See other info below. I will refer you to gastroenterology as well.   I will repeat labs today for the elevated liver test.  Depending on those readings I may recommend some imaging with either ultrasound or CT of the abdomen with your bloating and based on those labs.  If any new or worsening symptoms, be seen right away.  Continue same dose of blood pressure meds for now.  Follow-up in 3 months with your home blood pressure machine to check the accuracy.  Happy to see you sooner if needed if you are having elevated readings at home.  Take care       Abdominal Bloating: What to Know  Belly bloating is when your belly may feel full, tight, or painful. It may also look bigger or swollen. This is also called abdominal bloating. Common causes of belly bloating include: Swallowing air. Trouble pooping (constipation). Problems digesting food. Lactose intolerance. This means you can't digest lactose normally, a natural sugar in dairy. Celiac disease. This means you can't digest gluten, a protein found in wheat). Slow movement of food in the stomach and small intestine. Eating too much. Irritable bowel syndrome (IBS). This affects the large intestine. Gastroesophageal reflux disease (GERD). This is when stomach acid goes back into the throat. Urinary retention. This is when the bladder can't be emptied all the way. Follow these instructions at home: Eating and drinking Avoid eating too much. Try not to swallow air while talking or eating. Avoid eating while lying down. Avoid foods and drinks that may trigger your bloating. Depending on the cause this may include: Foods that cause gas, such as broccoli, cabbage, cauliflower, and baked beans. Fizzy, or carbonated, drinks. Hard candy. Chewing gum. Dairy. Medicines Take your  medicines only as told. Ask your provider if you should take a probiotic. Probiotics have good bacteria or yeast that can help with digestion. General instructions Exercise regularly to help with bloating from gas and any trouble with pooping. You may need to take steps to help treat or prevent trouble pooping, such as: Taking medicines to help you poop. Eating foods high in fiber, like beans, whole grains, and fresh fruits and vegetables. Drinking more fluids as told. Contact a health care provider if: You throw up or feel like you may throw up. You have watery poop, or diarrhea, that doesn't stop. You have belly pain that gets worse and medicines don't help. You are gaining or losing weight unexpectedly. Get help right away if: You have chest pain. You have trouble breathing. You feel short of breath. You have trouble peeing. You have dark pee or pee that smells bad. You have blood in your poop or dark, tarry poop. These symptoms may be an emergency. Call 911 right away. Do not wait to see if the symptoms will go away. Do not drive yourself to the hospital. This information is not intended to replace advice given to you by your health care provider. Make sure you discuss any questions you have with your health care provider. Document Revised: 11/23/2022 Document Reviewed: 11/23/2022 Elsevier Patient Education  2024 [employer].

## 2023-05-18 NOTE — Progress Notes (Signed)
Subjective:  Patient ID: Micheal Roberts, male    DOB: [birth year]  Age: 53 y.o. MRN: [id number]  CC:  Chief Complaint  Patient presents with   Medical Management of Chronic Issues    Pt is doing okay, notes continued bloating in the abdomen notes tried changing some foods and no changes     HPI Micheal Roberts presents for   Dysuria: With prior urinary hesitancy discussed in December.  Her symptoms resolved and normal urinary flow at that time.  Previous PSA level was normal and stable from previous readings.still doing well.   Hypertension: Decreased control last October with amlodipine increased to 5 mg daily, also recently started on CPAP for OSA.  Improved symptoms on CPAP and blood pressure has been stable on 5 mg dosing. Mostly rested with CPAP. No new side effects with amlodipine.  Home readings: variable 120/90? BP Readings from Last 3 Encounters:  05/18/23 128/80  04/04/23 124/88  03/22/23 124/76   Lab Results  Component Value Date   CREATININE 1.24 02/02/2023   Abdominal bloating Discussed at his December visit, reported some for the past year at that time, was taking Prilosec twice per day, no heartburn, no bowel changes except for some chronic hard stools yet daily bowel movements.  Sometimes smaller bowel movements. Handout given on bloating and discussed foods that can contribute to bloating at his December visit, we also discussed constipation treatment.  Increased fiber - supplement 2 times per day.  Softer stools, BM every other day with hard stools at times.  Water intake - not tracked, about 2-3 bottles per day.  No blood in stool. No diarrhea.  Abdomen still feels bloated, discomfort at times, no change with BM.  Colonoscopy in 2023. Micheal Roberts.   Elevated LFT Noted on 01/2023 labs.  Rare alcohol. Tyelnol every few days. Bloating as above, no jaundice/scleral icterus.no n/v.   Lab Results  Component Value Date   CHOL 165 02/02/2023   HDL 27.80 (L)  02/02/2023   LDLCALC 89 02/02/2023   TRIG 242.0 (H) 02/02/2023   CHOLHDL 6 02/02/2023         History Patient Active Problem List   Diagnosis Date Noted   OSA on CPAP 04/04/2023   Insomnia 04/04/2023   Hypertension, essential 08/07/2022   Acute prostatitis 06/22/2022   IBS (irritable bowel syndrome)    Past Medical History:  Diagnosis Date   Anxiety    IBS (irritable bowel syndrome)    Past Surgical History:  Procedure Laterality Date   left ACL arthroscopy     Allergies  Allergen Reactions   Benzonatate Anaphylaxis   Prior to Admission medications   Medication Sig Start Date End Date Taking? Authorizing Provider  amLODipine (NORVASC) 5 MG tablet Take 1 tablet (5 mg total) by mouth daily. 02/02/23  Yes Micheal Flood, MD  eszopiclone (LUNESTA) 1 MG TABS tablet Take 1 tablet (1 mg total) by mouth at bedtime as needed for sleep. Take immediately before bedtime 04/04/23  Yes Cobb, Ruby Cola, NP  hydrOXYzine (ATARAX) 10 MG tablet Take 1 tablet (10 mg total) by mouth 3 (three) times daily as needed. 02/05/23  Yes Micheal Flood, MD  omeprazole (PRILOSEC) 40 MG capsule Take 1 capsule (40 mg total) by mouth daily. Patient needs follow up appointment for future refills. Please call [phone] to schedule an appointment. 09/09/22  Yes Micheal Dare, MD   Social History   Socioeconomic History   Marital status: Married    Spouse  name: Not on file   Number of children: Not on file   Years of education: Not on file   Highest education level: Not on file  Occupational History   Not on file  Tobacco Use   Smoking status: Former    Current packs/day: 0.00    Average packs/day: 0.5 packs/day for 10.0 years (5.0 ttl pk-yrs)    Types: Cigarettes    Start date: 2008    Quit date: 2018    Years since quitting: 7.0   Smokeless tobacco: Never  Vaping Use   Vaping status: Never Used  Substance and Sexual Activity   Alcohol use: Yes    Alcohol/week: 0.0 standard  drinks of alcohol    Comment: beer - only on ocassions   Drug use: No   Sexual activity: Yes  Other Topics Concern   Not on file  Social History Narrative   Caffeine: 1-2 cup daily.   Education:  one yr college.  (Did not finish).   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     Social Drivers of Corporate investment banker Strain: Not on file  Food Insecurity: Not on file  Transportation Needs: Not on file  Physical Activity: Not on file  Stress: Not on file (02/23/2023)  Social Connections: Not on file  Intimate Partner Violence: Not on file    Review of Systems  Constitutional:  Negative for fatigue and unexpected weight change.  Eyes:  Negative for visual disturbance.  Respiratory:  Negative for cough, chest tightness and shortness of breath.   Cardiovascular:  Negative for chest pain, palpitations and leg swelling.  Gastrointestinal:  Positive for constipation. Negative for abdominal pain (rare discomfort, not pain. bloating.), anal bleeding, blood in stool, diarrhea, nausea and vomiting.  Neurological:  Negative for dizziness, light-headedness and headaches.    Objective:   Vitals:   05/18/23 1008  BP: 128/80  Pulse: 75  Temp: 98.9 F (37.2 C)  TempSrc: Temporal  SpO2: 97%  Weight: 248 lb 3.2 oz (112.6 kg)  Height: 6\' 1"  (1.854 m)     Physical Exam Vitals reviewed.  Constitutional:      General: He is not in acute distress.    Appearance: He is well-developed. He is not toxic-appearing.  HENT:     Head: Normocephalic and atraumatic.  Neck:     Vascular: No carotid bruit or JVD.  Cardiovascular:     Rate and Rhythm: Normal rate and regular rhythm.     Heart sounds: Normal heart sounds. No murmur heard. Pulmonary:     Effort: Pulmonary effort is normal.     Breath sounds: Normal breath sounds. No rales.  Abdominal:     Comments: Prominent abdomen, no focal tenderness.  Musculoskeletal:     Right lower leg: No edema.     Left lower leg: No edema.  Skin:    General:  Skin is warm and dry.  Neurological:     Mental Status: He is alert and oriented to person, place, and time.  Psychiatric:        Mood and Affect: Mood normal.        Assessment & Plan:  Micheal Roberts is a 53 y.o. male . Abdominal bloating - Plan: Ambulatory referral to Gastroenterology Constipation, unspecified constipation type - Plan: Ambulatory referral to Gastroenterology Elevated LFTs - Plan: Comprehensive metabolic panel  -Initially suspected bloating sensation from constipation, still some hard stools at times.  Bowel regimen discussed including use of MiraLAX, increasing fiber supplementation and fluid intake.  However given persistent symptoms will refer to gastroenterology.  I am also repeating his CMP today to evaluate LFTs and if those are still elevated with the bloating would consider CT or ultrasound.  RTC precautions if new or worsening symptoms.  Hypertension, essential - Plan: Comprehensive metabolic panel OSA on CPAP  -Blood pressure stable, symptoms improved on CPAP.  Continue same regimen.  58-month follow-up for lipids, labs and repeat blood pressure assessment with his home meter, RTC precautions if elevated readings prior.  No orders of the defined types were placed in this encounter.  Patient Instructions  Try miralax once or twice to help with bowel regimen, continue fiber supplement - option of additional dose or more fiber in diet (fruits, vegetables, whole grains). See other info below. I will refer you to gastroenterology as well.   I will repeat labs today for the elevated liver test.  Depending on those readings I may recommend some imaging with either ultrasound or CT of the abdomen with your bloating and based on those labs.  If any new or worsening symptoms, be seen right away.  Continue same dose of blood pressure meds for now.  Follow-up in 3 months with your home blood pressure machine to check the accuracy.  Happy to see you sooner if needed if  you are having elevated readings at home.  Take care       Abdominal Bloating: What to Know  Belly bloating is when your belly may feel full, tight, or painful. It may also look bigger or swollen. This is also called abdominal bloating. Common causes of belly bloating include: Swallowing air. Trouble pooping (constipation). Problems digesting food. Lactose intolerance. This means you can't digest lactose normally, a natural sugar in dairy. Celiac disease. This means you can't digest gluten, a protein found in wheat). Slow movement of food in the stomach and small intestine. Eating too much. Irritable bowel syndrome (IBS). This affects the large intestine. Gastroesophageal reflux disease (GERD). This is when stomach acid goes back into the throat. Urinary retention. This is when the bladder can't be emptied all the way. Follow these instructions at home: Eating and drinking Avoid eating too much. Try not to swallow air while talking or eating. Avoid eating while lying down. Avoid foods and drinks that may trigger your bloating. Depending on the cause this may include: Foods that cause gas, such as broccoli, cabbage, cauliflower, and baked beans. Fizzy, or carbonated, drinks. Hard candy. Chewing gum. Dairy. Medicines Take your medicines only as told. Ask your provider if you should take a probiotic. Probiotics have good bacteria or yeast that can help with digestion. General instructions Exercise regularly to help with bloating from gas and any trouble with pooping. You may need to take steps to help treat or prevent trouble pooping, such as: Taking medicines to help you poop. Eating foods high in fiber, like beans, whole grains, and fresh fruits and vegetables. Drinking more fluids as told. Contact a health care provider if: You throw up or feel like you may throw up. You have watery poop, or diarrhea, that doesn't stop. You have belly pain that gets worse and medicines  don't help. You are gaining or losing weight unexpectedly. Get help right away if: You have chest pain. You have trouble breathing. You feel short of breath. You have trouble peeing. You have dark pee or pee that smells bad. You have blood in your poop or dark, tarry poop. These symptoms may be an emergency. Call 911 right  away. Do not wait to see if the symptoms will go away. Do not drive yourself to the hospital. This information is not intended to replace advice given to you by your health care provider. Make sure you discuss any questions you have with your health care provider. Document Revised: 11/23/2022 Document Reviewed: 11/23/2022 Elsevier Patient Education  2024 Elsevier Inc.    Signed,   Meredith Staggers, MD [facility], [facility] 05/18/23 11:02 AM

## 2023-05-21 ENCOUNTER — Other Ambulatory Visit: Payer: Self-pay | Admitting: Family Medicine

## 2023-05-21 ENCOUNTER — Encounter: Payer: Self-pay | Admitting: Family Medicine

## 2023-05-21 DIAGNOSIS — R7989 Other specified abnormal findings of blood chemistry: Secondary | ICD-10-CM

## 2023-05-21 DIAGNOSIS — R140 Abdominal distension (gaseous): Secondary | ICD-10-CM

## 2023-05-21 NOTE — Progress Notes (Signed)
See labs. [place] for elevated lfts, bloating.

## 2023-05-24 ENCOUNTER — Other Ambulatory Visit: Payer: 59

## 2023-05-29 ENCOUNTER — Ambulatory Visit
Admission: RE | Admit: 2023-05-29 | Discharge: 2023-05-29 | Disposition: A | Discharge status: Home / Self Care | Payer: 59 | Source: Ambulatory Visit | Attending: Family Medicine | Admitting: Family Medicine

## 2023-05-29 DIAGNOSIS — R7989 Other specified abnormal findings of blood chemistry: Secondary | ICD-10-CM

## 2023-05-29 DIAGNOSIS — R140 Abdominal distension (gaseous): Secondary | ICD-10-CM

## 2023-05-30 ENCOUNTER — Encounter: Payer: Self-pay | Admitting: Family Medicine

## 2023-06-07 ENCOUNTER — Other Ambulatory Visit: Payer: Self-pay

## 2023-06-07 DIAGNOSIS — G478 Other sleep disorders: Secondary | ICD-10-CM

## 2023-06-07 DIAGNOSIS — F5101 Primary insomnia: Secondary | ICD-10-CM

## 2023-06-07 MED ORDER — ESZOPICLONE 1 MG PO TABS
1.0000 mg | ORAL_TABLET | Freq: Every evening | ORAL | 0 refills | Status: DC | PRN
Start: 2023-06-07 — End: 2023-06-19

## 2023-06-07 NOTE — Telephone Encounter (Signed)
 LOV 04/04/23 Lunesta 1mg , #30, 0 refills ROV 06/29/23  Please advise on refill request, thank you!

## 2023-06-19 ENCOUNTER — Other Ambulatory Visit: Payer: Self-pay

## 2023-06-19 DIAGNOSIS — F5101 Primary insomnia: Secondary | ICD-10-CM

## 2023-06-19 DIAGNOSIS — G478 Other sleep disorders: Secondary | ICD-10-CM

## 2023-06-19 MED ORDER — ESZOPICLONE 1 MG PO TABS
1.0000 mg | ORAL_TABLET | Freq: Every evening | ORAL | 0 refills | Status: DC | PRN
  Filled 2023-06-19: qty 30, 30d supply, fill #0

## 2023-06-19 NOTE — Telephone Encounter (Signed)
 Patient reports he is doing very well on Lunesta. Patient reminded to keep scheduled OV 06/29/23 and he states he will be here.  Rx ready to send. Thank you!

## 2023-06-19 NOTE — Telephone Encounter (Signed)
 Can we follow up with the patient and see how he's doing with the lunesta/how it's working for him? If working well and no side effects, I will send the refill. Thanks!

## 2023-06-19 NOTE — Telephone Encounter (Signed)
 Great, thanks! Sent!

## 2023-06-19 NOTE — Telephone Encounter (Signed)
 OV scheduled 06/29/23.  Last ordered 06/07/23, #30, 0 refills  Please review rx request, thank you!

## 2023-06-29 ENCOUNTER — Ambulatory Visit: Payer: BC Managed Care – PPO | Admitting: Nurse Practitioner

## 2023-07-14 ENCOUNTER — Encounter: Payer: Self-pay | Admitting: Gastroenterology

## 2023-07-14 ENCOUNTER — Ambulatory Visit: Payer: 59 | Admitting: Gastroenterology

## 2023-07-14 ENCOUNTER — Other Ambulatory Visit

## 2023-07-14 VITALS — BP 140/88 | HR 73 | Ht 73.0 in | Wt 245.0 lb

## 2023-07-14 DIAGNOSIS — R140 Abdominal distension (gaseous): Secondary | ICD-10-CM

## 2023-07-14 DIAGNOSIS — K5900 Constipation, unspecified: Secondary | ICD-10-CM | POA: Diagnosis not present

## 2023-07-14 DIAGNOSIS — K219 Gastro-esophageal reflux disease without esophagitis: Secondary | ICD-10-CM

## 2023-07-14 DIAGNOSIS — R7989 Other specified abnormal findings of blood chemistry: Secondary | ICD-10-CM

## 2023-07-14 DIAGNOSIS — K760 Fatty (change of) liver, not elsewhere classified: Secondary | ICD-10-CM | POA: Diagnosis not present

## 2023-07-14 DIAGNOSIS — K824 Cholesterolosis of gallbladder: Secondary | ICD-10-CM

## 2023-07-14 LAB — CBC WITH DIFFERENTIAL/PLATELET
Basophils Absolute: 0.1 10*3/uL (ref 0.0–0.1)
Basophils Relative: 1.2 % (ref 0.0–3.0)
Eosinophils Absolute: 0.1 10*3/uL (ref 0.0–0.7)
Eosinophils Relative: 3.1 % (ref 0.0–5.0)
HCT: 40.9 % (ref 39.0–52.0)
Hemoglobin: 13.9 g/dL (ref 13.0–17.0)
Lymphocytes Relative: 25.5 % (ref 12.0–46.0)
Lymphs Abs: 1.1 10*3/uL (ref 0.7–4.0)
MCHC: 33.8 g/dL (ref 30.0–36.0)
MCV: 88.4 fl (ref 78.0–100.0)
Monocytes Absolute: 0.3 10*3/uL (ref 0.1–1.0)
Monocytes Relative: 5.9 % (ref 3.0–12.0)
Neutro Abs: 2.9 10*3/uL (ref 1.4–7.7)
Neutrophils Relative %: 64.3 % (ref 43.0–77.0)
Platelets: 218 10*3/uL (ref 150.0–400.0)
RBC: 4.63 Mil/uL (ref 4.22–5.81)
RDW: 13.6 % (ref 11.5–15.5)
WBC: 4.5 10*3/uL (ref 4.0–10.5)

## 2023-07-14 LAB — IBC + FERRITIN
Ferritin: 204.4 ng/mL (ref 22.0–322.0)
Iron: 88 ug/dL (ref 42–165)
Saturation Ratios: 33.8 % (ref 20.0–50.0)
TIBC: 260.4 ug/dL (ref 250.0–450.0)
Transferrin: 186 mg/dL — ABNORMAL LOW (ref 212.0–360.0)

## 2023-07-14 LAB — COMPREHENSIVE METABOLIC PANEL WITH GFR
ALT: 116 U/L — ABNORMAL HIGH (ref 0–53)
AST: 63 U/L — ABNORMAL HIGH (ref 0–37)
Albumin: 4.4 g/dL (ref 3.5–5.2)
Alkaline Phosphatase: 64 U/L (ref 39–117)
BUN: 9 mg/dL (ref 6–23)
CO2: 27 meq/L (ref 19–32)
Calcium: 9.2 mg/dL (ref 8.4–10.5)
Chloride: 100 meq/L (ref 96–112)
Creatinine, Ser: 1.22 mg/dL (ref 0.40–1.50)
GFR: 68.03 mL/min (ref >60.00)
Glucose, Bld: 144 mg/dL — ABNORMAL HIGH (ref 70–99)
Potassium: 3.3 meq/L — ABNORMAL LOW (ref 3.5–5.1)
Sodium: 138 meq/L (ref 135–145)
Total Bilirubin: 0.7 mg/dL (ref 0.2–1.2)
Total Protein: 7.4 g/dL (ref 6.0–8.3)

## 2023-07-14 LAB — PROTIME-INR
INR: 1.1 ratio — ABNORMAL HIGH (ref 0.8–1.0)
Prothrombin Time: 12 s (ref 9.6–13.1)

## 2023-07-14 NOTE — Patient Instructions (Addendum)
 Start taking Miralax 1 capful (17 grams) 1x / day for 1 week.   If this is not effective, increase to 1 dose 2x / day for 1 week.   If this is still not effective, increase to two capfuls (34 grams) 2x / day.   Can adjust dose as needed based on response. Can take 1/2 cap daily, skip days, or increase per day.    Your provider has requested that you go to the basement level for lab work before leaving today. Press "B" on the elevator. The lab is located at the first door on the left as you exit the elevator.  Follow up as needed.  _______________________________________________________  If your blood pressure at your visit was 140/90 or greater, please contact your primary care physician to follow up on this.  _______________________________________________________  If you are age 71 or older, your body mass index should be between 23-30. Your Body mass index is 32.32 kg/m. If this is out of the aforementioned range listed, please consider follow up with your Primary Care Provider.  If you are age 64 or younger, your body mass index should be between 19-25. Your Body mass index is 32.32 kg/m. If this is out of the aformentioned range listed, please consider follow up with your Primary Care Provider.   ________________________________________________________  The [place] providers would like to encourage you to use [facility] to communicate with providers for non-urgent requests or questions.  Due to long hold times on the telephone, sending your provider a message by [facility] may be a faster and more efficient way to get a response.  Please allow 48 business hours for a response.  Please remember that this is for non-urgent requests.  _______________________________________________________  Due to recent changes in healthcare laws, you may see the results of your imaging and laboratory studies on MyChart before your provider has had a chance to review them.  We understand that in some cases  there may be results that are confusing or concerning to you. Not all laboratory results come back in the same time frame and the provider may be waiting for multiple results in order to interpret others.  Please give [place] 48 hours in order for your provider to thoroughly review all the results before contacting the office for clarification of your results.   Thank you for trusting me with your gastrointestinal care!   Boone Master, PA

## 2023-07-14 NOTE — Progress Notes (Signed)
 Chief Complaint: Bloating Primary GI MD: Dr. Christella Hartigan  HPI: 53 year old male history of hepatic steatosis, IBS, anxiety, hypertension, presents for evaluation of bloating  Noted to have elevated LFTs 01/2023 with AST 41, ALT 60 Repeat 04/2023: AST 67, ALT 112 RUQ ultrasound 05/2023 shows steatosis and cholelithiasis without evidence of acute cholecystitis.  Normal CBD  Patient presents today with his wife  He has been experiencing intermittent bloating since at least December, which is not necessarily related to eating. The bloating is accompanied by mild pain primarily on the right lower side, which does not worsen with movement. He also experiences variable constipation, with fiber supplements providing some relief, though he finds it challenging to remember to take them daily. He uses Metamucil, which may contribute to his bloating.  He has a history of elevated liver enzymes since October, with an ultrasound showing fatty liver. There is no family history of liver problems, and he reports occasional alcohol use.  States he has a "normal diet". He is up to date on his colonoscopy, which showed no concerning findings but did reveal a gallbladder polyp, a gallstone, and fatty liver.  He experiences breakthrough reflux symptoms approximately once a week despite daily omeprazole use. Omeprazole helps with reflux symptoms, but he still encounters occasional heartburn.     PREVIOUS GI WORKUP   Colonoscopy 05/2021 for screening - One 2 mm polyp (tubular adenoma) in the transverse colon. Resected with biopsy forceps.  - The examination was otherwise normal on direct and retroflexion views. -Repeat 7 years (2030)  EGD 05/2021 for upper abdominal pain - Normal upper GI tract  Past Medical History:  Diagnosis Date   Anxiety    Fatty liver    Gallstone    IBS (irritable bowel syndrome)     Past Surgical History:  Procedure Laterality Date   left ACL arthroscopy      Current Outpatient  Medications  Medication Sig Dispense Refill   amLODipine (NORVASC) 5 MG tablet Take 1 tablet (5 mg total) by mouth daily. 90 tablet 1   eszopiclone (LUNESTA) 1 MG TABS tablet Take 1 tablet (1 mg total) by mouth at bedtime as needed for sleep. Take immediately before bedtime 30 tablet 0   hydrOXYzine (ATARAX) 10 MG tablet Take 1 tablet (10 mg total) by mouth 3 (three) times daily as needed. 30 tablet 0   omeprazole (PRILOSEC) 40 MG capsule Take 1 capsule (40 mg total) by mouth daily. Patient needs follow up appointment for future refills. Please call [phone] to schedule an appointment. 90 capsule 0   No current facility-administered medications for this visit.    Allergies as of 07/14/2023 - Review Complete 07/14/2023  Allergen Reaction Noted   Benzonatate Anaphylaxis 04/05/2018    Family History  Problem Relation Age of Onset   Hypertension Mother    Anemia Mother    Thyroid disease Father    Hypertension Father    Stomach cancer Neg Hx    Colon cancer Neg Hx    Pancreatic cancer Neg Hx     Social History   Socioeconomic History   Marital status: Married    Spouse name: Not on file   Number of children: Not on file   Years of education: Not on file   Highest education level: Not on file  Occupational History   Not on file  Tobacco Use   Smoking status: Former    Current packs/day: 0.00    Average packs/day: 0.5 packs/day for 10.0 years (5.0 ttl  pk-yrs)    Types: Cigarettes    Start date: 2008    Quit date: 2018    Years since quitting: 7.2   Smokeless tobacco: Never  Vaping Use   Vaping status: Never Used  Substance and Sexual Activity   Alcohol use: Yes    Alcohol/week: 0.0 standard drinks of alcohol    Comment: beer - only on ocassions   Drug use: No   Sexual activity: Yes  Other Topics Concern   Not on file  Social History Narrative   Caffeine: 1-2 cup daily.   Education:  one yr college.  (Did not finish).   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     Social  Drivers of Corporate investment banker Strain: Not on file  Food Insecurity: Not on file  Transportation Needs: Not on file  Physical Activity: Not on file  Stress: Not on file (02/23/2023)  Social Connections: Not on file  Intimate Partner Violence: Not on file    Review of Systems:    Constitutional: No weight loss, fever, chills, weakness or fatigue HEENT: Eyes: No change in vision               Ears, Nose, Throat:  No change in hearing or congestion Skin: No rash or itching Cardiovascular: No chest pain, chest pressure or palpitations   Respiratory: No SOB or cough Gastrointestinal: See HPI and otherwise negative Genitourinary: No dysuria or change in urinary frequency Neurological: No headache, dizziness or syncope Musculoskeletal: No new muscle or joint pain Hematologic: No bleeding or bruising Psychiatric: No history of depression or anxiety    Physical Exam:  Vital signs: BP (!) 140/88   Pulse 73   Ht 6\' 1"  (1.854 m)   Wt 245 lb (111.1 kg)   BMI 32.32 kg/m   Constitutional: NAD, Well developed, Well nourished, alert and cooperative Head:  Normocephalic and atraumatic. Eyes:   PEERL, EOMI. No icterus. Conjunctiva pink. Respiratory: Respirations even and unlabored. Lungs clear to auscultation bilaterally.   No wheezes, crackles, or rhonchi.  Cardiovascular:  Regular rate and rhythm. No peripheral edema, cyanosis or pallor.  Gastrointestinal:  Soft, nondistended, nontender. No rebound or guarding. Normal bowel sounds. No appreciable masses or hepatomegaly. Rectal:  Not performed.  Msk:  Symmetrical without gross deformities. Without edema, no deformity or joint abnormality.  Neurologic:  Alert and  oriented x4;  grossly normal neurologically.  Skin:   Dry and intact without significant lesions or rashes. Psychiatric: Oriented to person, place and time. Demonstrates good judgement and reason without abnormal affect or behaviors.   RELEVANT LABS AND IMAGING: CBC     Component Value Date/Time   WBC 5.6 06/22/2022 1635   RBC 4.58 06/22/2022 1635   HGB 13.5 06/22/2022 1635   HGB 13.3 04/25/2019 1423   HCT 40.6 06/22/2022 1635   HCT 39.4 04/25/2019 1423   PLT 268.0 06/22/2022 1635   PLT 254 04/25/2019 1423   MCV 88.5 06/22/2022 1635   MCV 90 04/25/2019 1423   MCH 30.2 04/25/2019 1423   MCH 29.7 07/25/2013 1138   MCHC 33.2 06/22/2022 1635   RDW 13.5 06/22/2022 1635   RDW 11.9 04/25/2019 1423   LYMPHSABS 1.7 06/22/2022 1635   MONOABS 0.3 06/22/2022 1635   EOSABS 0.2 06/22/2022 1635   BASOSABS 0.1 06/22/2022 1635    CMP     Component Value Date/Time   NA 139 05/18/2023 1101   NA 139 04/25/2019 1423   K 3.7 05/18/2023 1101   CL 100  05/18/2023 1101   CO2 31 05/18/2023 1101   GLUCOSE 77 05/18/2023 1101   BUN 11 05/18/2023 1101   BUN 12 04/25/2019 1423   CREATININE 1.37 05/18/2023 1101   CREATININE 1.11 07/25/2013 1138   CALCIUM 9.4 05/18/2023 1101   PROT 7.5 05/18/2023 1101   PROT 7.1 04/25/2019 1423   ALBUMIN 4.5 05/18/2023 1101   ALBUMIN 4.7 04/25/2019 1423   AST 67 (H) 05/18/2023 1101   ALT 112 (H) 05/18/2023 1101   ALKPHOS 63 05/18/2023 1101   BILITOT 0.6 05/18/2023 1101   BILITOT 0.5 04/25/2019 1423   GFRNONAA 59 (L) 04/25/2019 1423   GFRAA 68 04/25/2019 1423     Assessment/Plan:      Bloating and Constipation Intermittent bloating and mild right lower quadrant pain likely due to constipation.  Taking fiber regularly provided relief but it is difficult for him to be consistent.  Up-to-date on colonoscopy.. Discussed high FODMAP foods and regular bowel movements. - Provide low FODMAP diet handout. - Switch fiber supplement to Benefiber or Citrucel to help alleviate some bloating.. - Initiate daily Miralax. - Advise use of stool under toilet for proper posture.  GERD Intermittent heartburn with trigger foods despite daily omeprazole.  Previous EGD unrevealing - Advise Pepcid as needed, especially with trigger foods. -  Educated patient on lifestyle modifications and provided patient education handouts  Elevated LFTs Hepatic steatosis Elevated liver enzymes and ultrasound findings of fatty liver. Discussed potential progression to cirrhosis and emphasized lifestyle modifications as treatment. 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  Must exclude other chronic causes of hepatocellular inflammation that can mimic fatty liver on ultrasound - Labs to include: hepatitis panel, iron, ferritin, TIBC,  IgG, ANA, Antismooth muscle antibody, AMA,  alpha one antitrypsin level, ceruloplasmin, CBC, CMP, PT/INR, serum copper - Consider elastography if FIB4 suggests advanced fibrosis.  - Abstain from all alcohol including beer, wine, liquor, and non-alcoholic beer.  - Work to maintain a health weight through portion control and exercise Maximize control of any hypergycemia and hyperlipidemia - if stage 2-3 fibrosis can consider Rezdiffra - need LFTs and CBC monitored every 6 months, revaluation every 2-3 years.   Gallbladder Polyp and Gallstone Ultrasound shows gallbladder polyp and gallstone. No symptoms, so no immediate intervention. Discussed potential for symptoms and surgical intervention.  LFTs not an obstructive pattern.     Assigned to Dr. Tomasa Rand today  Boone Master, PA-C [facility] 07/14/2023, 11:04 AM  Cc: Shade Flood, MD

## 2023-07-18 ENCOUNTER — Other Ambulatory Visit: Payer: Self-pay

## 2023-07-18 ENCOUNTER — Other Ambulatory Visit: Payer: Self-pay | Admitting: Family Medicine

## 2023-07-18 DIAGNOSIS — I10 Essential (primary) hypertension: Secondary | ICD-10-CM

## 2023-07-18 MED ORDER — OMEPRAZOLE 40 MG PO CPDR: 40.0000 mg | DELAYED_RELEASE_CAPSULE | Freq: Every day | ORAL | 1 refills | Status: DC

## 2023-07-19 LAB — HEPATITIS C ANTIBODY: Hepatitis C Ab: NONREACTIVE

## 2023-07-19 LAB — HEPATITIS B CORE ANTIBODY, TOTAL: Hep B Core Total Ab: NONREACTIVE

## 2023-07-19 LAB — MITOCHONDRIAL ANTIBODIES: Mitochondrial M2 Ab, IgG: <=20 U (ref <=20.0)

## 2023-07-19 LAB — IGA: Immunoglobulin A: 237 mg/dL (ref 47–310)

## 2023-07-19 LAB — IGG: IgG (Immunoglobin G), Serum: 1535 mg/dL (ref 600–1640)

## 2023-07-19 LAB — ANTI-SMOOTH MUSCLE ANTIBODY, IGG: Actin (Smooth Muscle) Antibody (IGG): 24 U — ABNORMAL HIGH (ref <20)

## 2023-07-19 LAB — ALPHA-1-ANTITRYPSIN: A-1 Antitrypsin, Ser: 118 mg/dL (ref 83–199)

## 2023-07-19 LAB — COPPER, SERUM

## 2023-07-19 LAB — HEPATITIS B SURFACE ANTIBODY,QUALITATIVE: Hep B S Ab: NONREACTIVE

## 2023-07-19 LAB — HEPATITIS B SURFACE ANTIGEN: Hepatitis B Surface Ag: NONREACTIVE

## 2023-07-19 LAB — CERULOPLASMIN: Ceruloplasmin: 24 mg/dL (ref 14–30)

## 2023-07-19 LAB — ANA: Anti Nuclear Antibody (ANA): NEGATIVE

## 2023-07-19 LAB — HEPATITIS A ANTIBODY, TOTAL: Hepatitis A AB,Total: NONREACTIVE

## 2023-07-23 NOTE — Progress Notes (Signed)
 Agree with the assessment and plan as outlined by Boone Master, PA-C, although I did not find any studies showing the presence of a gallbladder polyp.  [place] in Feb 2025 showed a gallstone, but unlikely to symptomatic based on report of patient's symptoms.  Would not suggest cholecystectomy based on reported symptoms.

## 2023-07-26 ENCOUNTER — Ambulatory Visit: Payer: Self-pay

## 2023-07-26 ENCOUNTER — Other Ambulatory Visit: Payer: Self-pay | Admitting: *Deleted

## 2023-07-26 ENCOUNTER — Encounter: Payer: Self-pay | Admitting: *Deleted

## 2023-07-26 DIAGNOSIS — G478 Other sleep disorders: Secondary | ICD-10-CM

## 2023-07-26 DIAGNOSIS — F5101 Primary insomnia: Secondary | ICD-10-CM

## 2023-07-26 NOTE — Telephone Encounter (Signed)
 Noted.  Looks like he was recommended to follow-up with pulmonary and ER precautions given, but not referred specifically to ER.  Looking back he has been prescribed Lunesta anywhere from 1 to 3 mg if needed for sleep, this has been prescribed by pulmonary.  Check to see if he has been using this medication.  I tried to check controlled substance database but error message and report unable to be created.

## 2023-07-26 NOTE — Telephone Encounter (Addendum)
  Chief Complaint: Insomnia Symptoms: insomnia, anxiety Frequency: for the past 5-6 days Pertinent Negatives: Patient denies Pain, chest pain, difficulty breathing, abdominal pain, nausea, vomiting, diarrhea Disposition: [] ED /[] Urgent Care (no appt availability in office) / [x] Appointment(In office/virtual)/ []  [facility]/ [] Home Care/ [] Refused Recommended Disposition /[]  Mobile Bus/ []  Follow-up with PCP Additional Notes: Patient called and advised that for the past 5-6 days he has been having trouble sleeping. Patient has been using his CPAP machine as he is supposed to but is still having trouble sleeping. Patient states that he did see Pulmonary Micheline Maze back in December and discussed this concern with her then.   Patient states that he has been awake at this point for the past 26 hours and is very concerned about not being able to sleep.  He denies any pain keeping him awake, chest pain, abdominal pain, nausea, vomiting, diarrhea, or any difficulty breathing. This RN called the CAL to see about getting the patient scheduled for an appointment. Patient is connected with the CAL and an appointment is made at this time by the office for next Wednesday with [facility]. Patient is also advised by this RN that if anything gets worse and/or he cannot wait until that appointment time, Urgent Cares and the Emergency Room are always there as options for him to seek immediate medical attention.  Patient verbalized understanding.    Reason for Disposition  Insomnia is an ongoing problem (> 2 weeks)  Answer Assessment - Initial Assessment Questions 1. DESCRIPTION: "Tell me about your sleeping problem."      For the past 5-6 days he has trouble sleeping.  He has been awake at this time 26 hours 2. ONSET: "How long have you been having trouble sleeping?" (e.g., days, weeks, months)     For the past 5-6 days he has trouble sleeping.  He has been awake at this time 26  hours 3. RECURRENT: "Have you had sleeping problems before?"  If Yes, ask: "What happened that time?" "What helped your sleeping problem go away in the past?"      "Not as severe" 4. STRESS: "Is there anything in your life that is making you feel stressed or tense?"     Patient gets upset when he can't sleep and he doesn't know if that makes it worse.   5. PAIN: "Do you have any pain that is keeping you awake?" (e.g., back pain, headache, abdomen pain)     Denies 6. CAFFEINE ABUSE: "Do you drink caffeinated beverages, and how much each day?" (e.g., coffee, tea, colas)     Patient did drink a lot of tea this past weekend but he has been drinking a lot of water ever since. 7. ALCOHOL USE OR SUBSTANCE USE (DRUG USE): "Do you drink alcohol or use any illegal drugs?"     Patient denies 8. OTHER SYMPTOMS: "Do you have any other symptoms?"  (e.g., difficulty breathing)     Patient denies  Protocols used: Insomnia-A-AH

## 2023-07-26 NOTE — Telephone Encounter (Signed)
Pt was advised to go to ED.

## 2023-07-26 NOTE — Telephone Encounter (Signed)
 Copied from CRM [id number]. Topic: Clinical - Red Word Triage >> Jul 26, 2023  1:42 PM Payton Doughty wrote: Red Word that prompted transfer to Nurse Triage: pt going on 26 hrs w/out sleep.  Please advise

## 2023-07-27 ENCOUNTER — Other Ambulatory Visit: Payer: Self-pay | Admitting: Family Medicine

## 2023-07-27 MED ORDER — ESZOPICLONE 1 MG PO TABS: 1.0000 mg | ORAL_TABLET | Freq: Every evening | ORAL | 0 refills | Status: DC | PRN

## 2023-07-27 NOTE — Telephone Encounter (Signed)
 Called patient to ask about taking lunesta. Patient did state he does take this medication but he is currently out. I do see that katherine Cobb has prescribed this medication in the past.

## 2023-07-27 NOTE — Addendum Note (Signed)
 Addended by: Meredith Staggers R on: 07/27/2023 05:10 PM   Modules accepted: Orders

## 2023-07-27 NOTE — Telephone Encounter (Addendum)
 Called Slept about 4 hrs last night.  Did take Alfonso Patten previously but ran out.  Does have a visit scheduled next week as planned.  I will refill Lunesta temporarily until he can discuss further with provider next week. Printed, will fax to pharmacy.

## 2023-07-28 NOTE — Telephone Encounter (Signed)
 Faxed order to pharmacy yesterday for [facility]

## 2023-08-02 ENCOUNTER — Ambulatory Visit: Admitting: Nurse Practitioner

## 2023-08-02 ENCOUNTER — Encounter: Payer: Self-pay | Admitting: Nurse Practitioner

## 2023-08-02 VITALS — BP 114/76 | HR 78 | Temp 98.1°F | Ht 73.0 in | Wt 245.4 lb

## 2023-08-02 DIAGNOSIS — G4733 Obstructive sleep apnea (adult) (pediatric): Secondary | ICD-10-CM

## 2023-08-02 DIAGNOSIS — F5101 Primary insomnia: Secondary | ICD-10-CM

## 2023-08-02 MED ORDER — ESZOPICLONE 1 MG PO TABS: 1.0000 mg | ORAL_TABLET | Freq: Every evening | ORAL | 5 refills | Status: DC | PRN

## 2023-08-02 NOTE — Patient Instructions (Addendum)
 Continue to use CPAP every night, minimum of 4-6 hours a night.  Change equipment as directed. Wash your tubing with warm soap and water daily, hang to dry. Wash humidifier portion weekly. Use bottled, distilled water and change daily Be aware of reduced alertness and do not drive or operate heavy machinery if experiencing this or drowsiness.  Exercise encouraged, as tolerated. Healthy weight management discussed.  Avoid or decrease alcohol consumption and medications that make you more sleepy, if possible. Notify if persistent daytime sleepiness occurs even with consistent use of PAP therapy.   We discussed how untreated sleep apnea puts an individual at risk for cardiac arrhthymias, pulm HTN, DM, stroke and increases their risk for daytime accidents.   Changed settings back to auto set at 10-15 cmH2O, expiratory support (EPR) at level 3 and adjusted your ramp pressure up to 8 cmH2O. Let me know if you still feel like you're not getting enough air on this or if you can't tell the settings changed so I can check with the medical supply company    Continue lunesta 1-2 mg At bedtime as needed for sleep. Take immediately before bed. Ensure you have 7-8 hours in the bed after taking. Monitor for any mood changes or changes in sleep habits. Stop and notify immediately if these occur. Do not drive or operate heavy machinery after taking. Do not take with alcohol or other sedating medications. Ensure you apply your CPAP within 5-10 minutes of taking to avoid falling asleep without it. May cause some morning grogginess or vivid dreams.    Follow up in 6 months with Dr. Gaynell Keeler or Gina Lagos. If symptoms do not improve or worsen, please contact office for sooner follow up or seek emergency care.

## 2023-08-02 NOTE — Progress Notes (Unsigned)
 @Patient  ID: Micheal Roberts, male    DOB: [birth year], 53 y.o.   MRN: [id number]  Chief Complaint  Patient presents with   Follow-up    Needs a refill on Lunesta.  Using CPAP nightly.    Referring provider: Benjiman Bras, MD  HPI: 53 year old male, former smoker followed for OSA on CPAP. He is a patient of Dr. Alyne Jules and last seen in office 08/19/2022. Past medical history significant for HTN, IBS.   TEST/EVENTS:  09/19/2022 HST: AHI 46.8, SpO2 low 78%  08/19/2022: OV with Dr. Gaynell Keeler. Has been told he stops breathing. Some gasping/choking. Longstanding history of snoring. Takes hours to fall asleep. About 4 awakenings. Recently started on antihypertensives. HST ordered.   04/04/2023: Today - follow up Patient presents today for follow-up.  He was started on CPAP about 4 months ago.  Feeling like he is sleeping better with it.  Does notice a difference on the nights that he does not sleep with it.  He still struggles with sleep latency.  Can take him hours to fall asleep some nights.  This causes him to not have enough sleep at night.  He usually averages about 5 to 6 hours.  Still feels like his energy levels are not the best during the day.  He does occasionally feel like he does not get enough air when wearing the CPAP.  Wearing a fullface mask.  Denies any issues with drowsy driving, morning headaches or sleep parasomnia/paralysis.  He has tried over-the-counter sleep aids which have not been very effective.  He does have hydroxyzine for anxiety but this does not seem to really help him fall asleep at night.  Does not take it on a consistent basis.  03/04/2023-04/02/2023: CPAP 5-20 cmH2O 27/30 days; 83% >4 hr; average use 5 hr 51 min Pressure 12.7 Leaks 95th 13.7 AHI 3.5   08/02/2023: Today - follow up  07/13/2023-08/01/2023 (partial download d/t setting change): CPAP 10 cmH2O 16/20 days; 13 days >4 hr; average use 5 hr 53 min Leaks 95h 9.5 AHI 4.4  Allergies  Allergen  Reactions   Benzonatate Anaphylaxis    Immunization History  Administered Date(s) Administered   Influenza, Seasonal, Injecte, Preservative Fre 02/02/2023   Influenza,inj,Quad PF,6+ Mos 05/25/2015, 06/21/2016, 04/05/2018, 04/25/2019, 03/18/2021, 02/24/2022   PFIZER(Purple Top)SARS-COV-2 Vaccination 06/22/2019, 11/06/2020   Tdap 09/03/2020   Zoster Recombinant(Shingrix) 03/18/2021    Past Medical History:  Diagnosis Date   Anxiety    Fatty liver    Gallstone    IBS (irritable bowel syndrome)     Tobacco History: Social History   Tobacco Use  Smoking Status Former   Current packs/day: 0.00   Average packs/day: 0.5 packs/day for 10.0 years (5.0 ttl pk-yrs)   Types: Cigarettes   Start date: 2008   Quit date: 2018   Years since quitting: 7.2  Smokeless Tobacco Never   Counseling given: Not Answered   Outpatient Medications Prior to Visit  Medication Sig Dispense Refill   amLODipine (NORVASC) 5 MG tablet TAKE 1 TABLET(5 MG) BY MOUTH DAILY 90 tablet 1   eszopiclone (LUNESTA) 1 MG TABS tablet Take 1 tablet (1 mg total) by mouth at bedtime as needed for sleep. Take immediately before bedtime 30 tablet 0   omeprazole (PRILOSEC) 40 MG capsule Take 1 capsule (40 mg total) by mouth daily. 90 capsule 1   hydrOXYzine (ATARAX) 10 MG tablet Take 1 tablet (10 mg total) by mouth 3 (three) times daily as needed. (Patient not taking: Reported  on 08/02/2023) 30 tablet 0   No facility-administered medications prior to visit.     Review of Systems:   Constitutional: No weight loss or gain, night sweats, fevers, chills, or lassitude. +fatigue  HEENT: No headaches, difficulty swallowing, tooth/dental problems, or sore throat. No sneezing, itching, ear ache, nasal congestion, or post nasal drip CV:  No chest pain, orthopnea, PND, swelling in lower extremities, anasarca, dizziness, palpitations, syncope Resp: No shortness of breath with exertion or at rest. No excess mucus or change in color  of mucus. No productive or non-productive. No hemoptysis. No wheezing.  No chest wall deformity GI:  No heartburn, indigestion GU: No dysuria, change in color of urine, urgency or frequency.   Skin: No rash, lesions, ulcerations MSK:  No joint pain or swelling.  Neuro: No dizziness or lightheadedness.  Psych: No depression or anxiety. Mood stable. +sleep disturbance     Physical Exam:  BP 114/76 (BP Location: Right Arm, Patient Position: Sitting, Cuff Size: Large)   Pulse 78   Temp 98.1 F (36.7 C) (Oral)   Ht 6\' 1"  (1.854 m)   Wt 245 lb 6.4 oz (111.3 kg)   SpO2 96%   BMI 32.38 kg/m   GEN: Pleasant, interactive, well-appearing; in no acute distress HEENT:  Normocephalic and atraumatic. PERRLA. Sclera white. Nasal turbinates pink, moist and patent bilaterally. No rhinorrhea present. Oropharynx pink and moist, without exudate or edema. No lesions, ulcerations, or postnasal drip.  NECK:  Supple w/ fair ROM. No JVD present. Normal carotid impulses w/o bruits. Thyroid symmetrical with no goiter or nodules palpated. No lymphadenopathy.   CV: RRR, no m/r/g, no peripheral edema. Pulses intact, +2 bilaterally. No cyanosis, pallor or clubbing. PULMONARY:  Unlabored, regular breathing. Clear bilaterally A&P w/o wheezes/rales/rhonchi. No accessory muscle use.  GI: BS present and normoactive. Soft, non-tender to palpation. No organomegaly or masses detected. MSK: No erythema, warmth or tenderness. Cap refil <2 sec all extrem. No deformities or joint swelling noted.  Neuro: A/Ox3. No focal deficits noted.   Skin: Warm, no lesions or rashe Psych: Normal affect and behavior. Judgement and thought content appropriate.     Lab Results:  CBC    Component Value Date/Time   WBC 4.5 07/14/2023 1049   RBC 4.63 07/14/2023 1049   HGB 13.9 07/14/2023 1049   HGB 13.3 04/25/2019 1423   HCT 40.9 07/14/2023 1049   HCT 39.4 04/25/2019 1423   PLT 218.0 07/14/2023 1049   PLT 254 04/25/2019 1423   MCV  88.4 07/14/2023 1049   MCV 90 04/25/2019 1423   MCH 30.2 04/25/2019 1423   MCH 29.7 07/25/2013 1138   MCHC 33.8 07/14/2023 1049   RDW 13.6 07/14/2023 1049   RDW 11.9 04/25/2019 1423   LYMPHSABS 1.1 07/14/2023 1049   MONOABS 0.3 07/14/2023 1049   EOSABS 0.1 07/14/2023 1049   BASOSABS 0.1 07/14/2023 1049    BMET    Component Value Date/Time   NA 138 07/14/2023 1049   NA 139 04/25/2019 1423   K 3.3 (L) 07/14/2023 1049   CL 100 07/14/2023 1049   CO2 27 07/14/2023 1049   GLUCOSE 144 (H) 07/14/2023 1049   BUN 9 07/14/2023 1049   BUN 12 04/25/2019 1423   CREATININE 1.22 07/14/2023 1049   CREATININE 1.11 07/25/2013 1138   CALCIUM 9.2 07/14/2023 1049   GFRNONAA 59 (L) 04/25/2019 1423   GFRAA 68 04/25/2019 1423    BNP No results found for: "BNP"   Imaging:  No results found.  Administration History     None           No data to display          No results found for: "NITRICOXIDE"      Assessment & Plan:   No problem-specific Assessment & Plan notes found for this encounter.    Advised if symptoms do not improve or worsen, to please contact office for sooner follow up or seek emergency care.   I spent 32 minutes of dedicated to the care of this patient on the date of this encounter to include pre-visit review of records, face-to-face time with the patient discussing conditions above, post visit ordering of testing, clinical documentation with the electronic health record, making appropriate referrals as documented, and communicating necessary findings to members of the patients care team.  Roetta Clarke, NP 08/02/2023  Pt aware and understands NP's role.

## 2023-08-03 ENCOUNTER — Encounter: Payer: Self-pay | Admitting: Nurse Practitioner

## 2023-08-03 ENCOUNTER — Telehealth: Payer: Self-pay

## 2023-08-03 DIAGNOSIS — F5101 Primary insomnia: Secondary | ICD-10-CM

## 2023-08-03 NOTE — Telephone Encounter (Signed)
 Request was partially denied, limits of coverage are for 30 tablets per month.

## 2023-08-03 NOTE — Assessment & Plan Note (Signed)
 Severe OSA on CPAP. Suboptimal compliance multifactorial related to insomnia and pressure issues. Unclear why he was changed to a set pressure of 10 cmH2O. Will send an order to the DME to update his settings to auto 10-15 cmH2O and also adjusted this in [employer] today. He will notify if this does not help. Encouraged him to increase usage. Reviewed risks of untreated OSA. Aware of proper care/use. Safe driving practices reviewed.

## 2023-08-03 NOTE — Telephone Encounter (Signed)
*  Pulm  Pharmacy Patient Advocate Encounter   Received notification from CoverMyMeds that prior authorization for Eszopiclone 1MG  tablets  is required/requested.   Insurance verification completed.   The patient is insured through [facility] .   Per test claim: PA required; PA submitted to above mentioned insurance via CoverMyMeds Key/confirmation #/EOC [id number] Status is pending

## 2023-08-03 NOTE — Assessment & Plan Note (Signed)
 Improved with use of eszopiclone (Lunesta). Tolerating well and appears to be effective for him. He uses 1-2 tablets nightly. No aberrant behavior. Will send refill today. PDMP reviewed. He understands side effect/safety profile. Aware to apply CPAP after taking. Sleep hygiene reviewed.   Patient Instructions  Continue to use CPAP every night, minimum of 4-6 hours a night.  Change equipment as directed. Wash your tubing with warm soap and water daily, hang to dry. Wash humidifier portion weekly. Use bottled, distilled water and change daily Be aware of reduced alertness and do not drive or operate heavy machinery if experiencing this or drowsiness.  Exercise encouraged, as tolerated. Healthy weight management discussed.  Avoid or decrease alcohol consumption and medications that make you more sleepy, if possible. Notify if persistent daytime sleepiness occurs even with consistent use of PAP therapy.   We discussed how untreated sleep apnea puts an individual at risk for cardiac arrhthymias, pulm HTN, DM, stroke and increases their risk for daytime accidents.   Changed settings back to auto set at 10-15 cmH2O, expiratory support (EPR) at level 3 and adjusted your ramp pressure up to 8 cmH2O. Let me know if you still feel like you're not getting enough air on this or if you can't tell the settings changed so I can check with the medical supply company    Continue lunesta 1-2 mg At bedtime as needed for sleep. Take immediately before bed. Ensure you have 7-8 hours in the bed after taking. Monitor for any mood changes or changes in sleep habits. Stop and notify immediately if these occur. Do not drive or operate heavy machinery after taking. Do not take with alcohol or other sedating medications. Ensure you apply your CPAP within 5-10 minutes of taking to avoid falling asleep without it. May cause some morning grogginess or vivid dreams.    Follow up in 6 months with Dr. Gaynell Keeler or Gina Lagos. If  symptoms do not improve or worsen, please contact office for sooner follow up or seek emergency care.

## 2023-08-11 ENCOUNTER — Ambulatory Visit

## 2023-08-11 NOTE — Telephone Encounter (Signed)
 His prescription was adjusted for 1-2 mg At bedtime. He does not always require 2 mg. Can we resubmit with new rx? Thanks.

## 2023-08-14 NOTE — Telephone Encounter (Signed)
 Maybe a script for 1mg  and another for 2mg ? Insurance is picky and will only cover 30 tablets per month.

## 2023-08-23 ENCOUNTER — Ambulatory Visit: Admitting: Nurse Practitioner

## 2023-08-25 NOTE — Telephone Encounter (Signed)
 Please notify patient of the above findings from our prior authorization group.  See if he was able to get the Lunesta  and how he is doing on it.  If he is primarily taking 2 mg, can switch his prescription over.  Thanks

## 2023-08-28 NOTE — Telephone Encounter (Signed)
 I spoke with the patient. He is only having to take 1 tablet a night.

## 2023-08-29 MED ORDER — ESZOPICLONE 1 MG PO TABS: 1.0000 mg | ORAL_TABLET | Freq: Every evening | ORAL | 5 refills | Status: DC | PRN

## 2023-08-29 NOTE — Addendum Note (Signed)
 Addended by: Loni Delbridge V on: 08/29/2023 10:54 AM   Modules accepted: Orders

## 2023-08-29 NOTE — Telephone Encounter (Signed)
 The patient is aware.  Nothing further needed.

## 2023-09-15 ENCOUNTER — Ambulatory Visit

## 2023-10-19 ENCOUNTER — Ambulatory Visit: Admitting: Nurse Practitioner

## 2023-11-13 ENCOUNTER — Ambulatory Visit (INDEPENDENT_AMBULATORY_CARE_PROVIDER_SITE_OTHER): Admitting: Family Medicine

## 2023-11-13 VITALS — BP 112/60 | HR 74 | Temp 98.0°F | Ht 73.0 in | Wt 268.2 lb

## 2023-11-13 DIAGNOSIS — N342 Other urethritis: Secondary | ICD-10-CM

## 2023-11-13 DIAGNOSIS — R300 Dysuria: Secondary | ICD-10-CM | POA: Diagnosis not present

## 2023-11-13 LAB — CBC
HCT: 40.4 % (ref 39.0–52.0)
Hemoglobin: 13.1 g/dL (ref 13.0–17.0)
MCHC: 32.4 g/dL (ref 30.0–36.0)
MCV: 89.6 fl (ref 78.0–100.0)
Platelets: 219 K/uL (ref 150.0–400.0)
RBC: 4.51 Mil/uL (ref 4.22–5.81)
RDW: 13.4 % (ref 11.5–15.5)
WBC: 5 K/uL (ref 4.0–10.5)

## 2023-11-13 MED ORDER — AZITHROMYCIN 250 MG PO TABS: ORAL_TABLET | ORAL | 0 refills | Status: AC

## 2023-11-13 NOTE — Progress Notes (Unsigned)
 Subjective:  Patient ID: Micheal Roberts, male    DOB: April 17, 1971  Age: 53 y.o. MRN: [id number]  CC:  Chief Complaint  Patient presents with   Medical Management of Chronic Issues    Want to get prostate checked; having some pressure in groin area; off and on for a few months now    HPI Micheal Roberts presents for   Dysuria, groin discomfort: Sore on inside of groin - last week then resolved. Pressure sensation in to groin. No rash. Improved, then back today.  Burning after urination in penis past 4-5 days. Some increased urgency but has been drinking more fluids.  No hematuria.  No penile d/c.  No testicular pain.  No new sexual partners, monogamous with spouse only.  Declines STI screening. No f/c. No abd pain.  No flank pain. No penile rash/scrotal rash.     Lab Results  Component Value Date   PSA1 0.2 04/05/2018   PSA 0.17 02/02/2023   PSA 0.14 06/22/2022   PSA 0.15 09/03/2020     History Patient Active Problem List   Diagnosis Date Noted   OSA on CPAP 04/04/2023   Insomnia 04/04/2023   Hypertension, essential 08/07/2022   Acute prostatitis 06/22/2022   IBS (irritable bowel syndrome)    Past Medical History:  Diagnosis Date   Anxiety    Fatty liver    Gallstone    IBS (irritable bowel syndrome)    Past Surgical History:  Procedure Laterality Date   left ACL arthroscopy     Allergies  Allergen Reactions   Benzonatate  Anaphylaxis   Prior to Admission medications   Medication Sig Start Date End Date Taking? Authorizing Provider  amLODipine  (NORVASC ) 5 MG tablet TAKE 1 TABLET(5 MG) BY MOUTH DAILY 07/18/23  Yes Levora Reyes SAUNDERS, MD  eszopiclone  (LUNESTA ) 1 MG TABS tablet Take 1 tablet (1 mg total) by mouth at bedtime as needed for sleep. Take immediately before bedtime 08/29/23  Yes Cobb, Comer GAILS, NP  omeprazole  (PRILOSEC) 40 MG capsule Take 1 capsule (40 mg total) by mouth daily. 07/18/23  Yes McMichael, Nestor HERO, PA-C   Social History    Socioeconomic History   Marital status: Married    Spouse name: Not on file   Number of children: Not on file   Years of education: Not on file   Highest education level: Some college, no degree  Occupational History   Not on file  Tobacco Use   Smoking status: Former    Current packs/day: 0.00    Average packs/day: 0.5 packs/day for 10.0 years (5.0 ttl pk-yrs)    Types: Cigarettes    Start date: 2008    Quit date: 2018    Years since quitting: 7.5   Smokeless tobacco: Never  Vaping Use   Vaping status: Never Used  Substance and Sexual Activity   Alcohol use: Yes    Alcohol/week: 0.0 standard drinks of alcohol    Comment: beer - only on ocassions   Drug use: No   Sexual activity: Yes  Other Topics Concern   Not on file  Social History Narrative   Caffeine: 1-2 cup daily.   Education:  one yr college.  (Did not finish).   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     Social Drivers of Health   Financial Resource Strain: Low Risk  (11/13/2023)   Overall Financial Resource Strain (CARDIA)    Difficulty of Paying Living Expenses: Not very hard  Food Insecurity: No Food Insecurity (11/13/2023)   Hunger  Vital Sign    Worried About Programme researcher, broadcasting/film/video in the Last Year: Never true    Ran Out of Food in the Last Year: Never true  Transportation Needs: No Transportation Needs (11/13/2023)   PRAPARE - Administrator, Civil Service (Medical): No    Lack of Transportation (Non-Medical): No  Physical Activity: Insufficiently Active (11/13/2023)   Exercise Vital Sign    Days of Exercise per Week: 2 days    Minutes of Exercise per Session: 10 min  Stress: Stress Concern Present (11/13/2023)   [name] of Occupational Health - Occupational Stress Questionnaire    Feeling of Stress: To some extent  Social Connections: Socially Isolated (11/13/2023)   Social Connection and Isolation Panel    Frequency of Communication with Friends and Family: Once a week    Frequency of Social  Gatherings with Friends and Family: Once a week    Attends Religious Services: Patient declined    Database administrator or Organizations: No    Attends Engineer, structural: Not on file    Marital Status: Married  Catering manager Violence: Not on file    Review of Systems Per HPI.   Objective:   Vitals:   11/13/23 1200  BP: 112/60  Pulse: 74  Temp: 98 F (36.7 C)  SpO2: 97%  Weight: 268 lb 3.2 oz (121.7 kg)  Height: 6' 1 (1.854 m)     Physical Exam Constitutional:      General: He is not in acute distress.    Appearance: Normal appearance. He is well-developed.  HENT:     Head: Normocephalic and atraumatic.  Cardiovascular:     Rate and Rhythm: Normal rate.  Pulmonary:     Effort: Pulmonary effort is normal.  Genitourinary:    Comments: Offered chaperone, patient declined.  No external rash appreciated, no testicle or epididymis tenderness or appreciable swelling or mass.  No penile discharge or rash.  No inguinal hernia appreciated, no inguinal lymphadenopathy appreciated. Neurological:     Mental Status: He is alert and oriented to person, place, and time.  Psychiatric:        Mood and Affect: Mood normal.       Benzonatate  allergy with reported anaphylaxis, avoiding doxycycline  and Septra. Assessment & Plan:  Micheal Roberts is a 53 y.o. male . Dysuria - Plan: POCT urinalysis dipstick, CBC, PSA, Urine Culture, azithromycin  (ZITHROMAX ) 250 MG tablet  Urethritis - Plan: Urine Culture, azithromycin  (ZITHROMAX ) 250 MG tablet Symptoms suspicious for urethritis, less likely prostatitis.  Will check PSA, CBC, urinalysis and urine culture.  Given allergy to benzonatate  unable to use doxycycline  or Septra when ordered.  Will prescribe azithromycin , with RTC precautions given.  Declined STI screening but if symptoms or not improving, will need to have that performed as well with RTC precautions given.  Meds ordered this encounter  Medications    azithromycin  (ZITHROMAX ) 250 MG tablet    Sig: Take 2 tablets on day 1, then 1 tablet daily on days 2 through 5    Dispense:  6 tablet    Refill:  0   Patient Instructions  Thanks for coming today.  I suspect you may have a urethritis or infection in the urinary system.  Start antibiotic doxycycline  twice per day.  Make sure to obtain a urine test prior to starting the antibiotic.  If any concerns on labs I will let you know.  If not improving within the next 1 week or any  worsening sooner please be seen.  Take care.    Return to the clinic or go to the nearest emergency room if any of your symptoms worsen or new symptoms occur.         Signed,   Reyes Pines, MD [facility], [facility] 11/13/23 12:52 PM

## 2023-11-13 NOTE — Patient Instructions (Addendum)
 Thanks for coming today.  I suspect you may have a urethritis or infection in the urinary system.  Start antibiotic doxycycline  twice per day.  Make sure to obtain a urine test prior to starting the antibiotic.  If any concerns on labs I will let you know.  If not improving within the next 1 week or any worsening sooner please be seen.  Take care.    Return to the clinic or go to the nearest emergency room if any of your symptoms worsen or new symptoms occur.

## 2023-11-14 LAB — URINE CULTURE
MICRO NUMBER:: 16753497
Result:: NO GROWTH
SPECIMEN QUALITY:: ADEQUATE

## 2023-11-14 LAB — PSA: PSA: 0.09 ng/mL — ABNORMAL LOW (ref 0.10–4.00)

## 2023-11-15 ENCOUNTER — Other Ambulatory Visit: Payer: Self-pay

## 2023-11-15 DIAGNOSIS — F5101 Primary insomnia: Secondary | ICD-10-CM

## 2023-11-15 NOTE — Telephone Encounter (Signed)
 Copied from CRM [id number]. Topic: Clinical - Prescription Issue >> Nov 15, 2023  7:39 AM Micheal Roberts wrote: Reason for CRM: pt states al [facility] are out of the  eszopiclone  (LUNESTA ) 1 MG TABS tablet Pt would like it sent to [id number] - [place], [place] - [address]  Spoke with patient regarding  prior message . Patient stated that [facility] does not have his medication Lunesta  in stock and would like [facility] . Advised patient I will have to send a refill request to the provider and call [facility] to cancel his script.  Dr.Olalere can you please advise

## 2023-11-16 ENCOUNTER — Ambulatory Visit: Payer: Self-pay | Admitting: Family Medicine

## 2023-11-16 MED ORDER — ESZOPICLONE 1 MG PO TABS: 1.0000 mg | ORAL_TABLET | Freq: Every evening | ORAL | 5 refills | Status: AC | PRN

## 2023-11-17 ENCOUNTER — Ambulatory Visit (INDEPENDENT_AMBULATORY_CARE_PROVIDER_SITE_OTHER)

## 2023-11-17 DIAGNOSIS — K760 Fatty (change of) liver, not elsewhere classified: Secondary | ICD-10-CM

## 2023-11-17 DIAGNOSIS — Z23 Encounter for immunization: Secondary | ICD-10-CM

## 2023-11-17 DIAGNOSIS — R7989 Other specified abnormal findings of blood chemistry: Secondary | ICD-10-CM

## 2023-11-24 ENCOUNTER — Encounter: Payer: Self-pay | Admitting: Family Medicine

## 2023-11-24 ENCOUNTER — Ambulatory Visit (INDEPENDENT_AMBULATORY_CARE_PROVIDER_SITE_OTHER): Admitting: Family Medicine

## 2023-11-24 VITALS — BP 124/60 | HR 80 | Temp 98.0°F | Ht 73.0 in | Wt 243.8 lb

## 2023-11-24 DIAGNOSIS — F411 Generalized anxiety disorder: Secondary | ICD-10-CM | POA: Diagnosis not present

## 2023-11-24 DIAGNOSIS — R200 Anesthesia of skin: Secondary | ICD-10-CM | POA: Diagnosis not present

## 2023-11-24 DIAGNOSIS — R202 Paresthesia of skin: Secondary | ICD-10-CM | POA: Diagnosis not present

## 2023-11-24 LAB — BASIC METABOLIC PANEL WITH GFR
BUN: 10 mg/dL (ref 6–23)
CO2: 31 meq/L (ref 19–32)
Calcium: 9.3 mg/dL (ref 8.4–10.5)
Chloride: 99 meq/L (ref 96–112)
Creatinine, Ser: 1.32 mg/dL (ref 0.40–1.50)
GFR: 61.74 mL/min (ref >60.00)
Glucose, Bld: 87 mg/dL (ref 70–99)
Potassium: 3.8 meq/L (ref 3.5–5.1)
Sodium: 142 meq/L (ref 135–145)

## 2023-11-24 LAB — CBC
HCT: 40.7 % (ref 39.0–52.0)
Hemoglobin: 13.7 g/dL (ref 13.0–17.0)
MCHC: 33.6 g/dL (ref 30.0–36.0)
MCV: 88.3 fl (ref 78.0–100.0)
Platelets: 224 K/uL (ref 150.0–400.0)
RBC: 4.61 Mil/uL (ref 4.22–5.81)
RDW: 13.3 % (ref 11.5–15.5)
WBC: 4.1 K/uL (ref 4.0–10.5)

## 2023-11-24 LAB — VITAMIN B12: Vitamin B-12: 400 pg/mL (ref 211–911)

## 2023-11-24 LAB — MAGNESIUM: Magnesium: 2 mg/dL (ref 1.5–2.5)

## 2023-11-24 LAB — TSH: TSH: 1.38 u[IU]/mL (ref 0.35–5.50)

## 2023-11-24 MED ORDER — SERTRALINE HCL 25 MG PO TABS: 25.0000 mg | ORAL_TABLET | Freq: Every day | ORAL | 3 refills | Status: DC

## 2023-11-24 NOTE — Progress Notes (Signed)
 Subjective:  Patient ID: Micheal Roberts, male    DOB: 08/20/1970  Age: 53 y.o. MRN: [id number]  CC:  Chief Complaint  Patient presents with   Numbness    Notes numbness in his legs and arms, pins and needle feeling, notes started last week and has been intermittent but increasing frequency     HPI Micheal Roberts presents for   Dysesthesias Started about 10 days ago - woke up with legs feeling like they were asleep - tingling feeling, pins and needles. No weakness. Improved when got of bed and moved.  Returned 4 days ago - similar symptoms - both legs and arms this time. Not weak, just tingling. No perioral numbness, no face symptoms. No new muscle weakness, change in speech or headache.   Symptoms have persisted since Monday - tingling in arms, legs, hands.  No recent diet changes, no new meds. Multivitamin, no new supplements. Some stress with work, no new anxiety symptoms. Some persistent anxiety, but no similar nerve symptoms with anxiety in past. No meds for anxiety currently. Would like to start a new med.      11/13/2023   12:27 PM 11/13/2023   12:26 PM 05/18/2023   10:13 AM 03/22/2023    1:13 PM  GAD 7 : Generalized Anxiety Score  Nervous, Anxious, on Edge 0 2 0 1  Control/stop worrying 1 1 1  0  Worry too much - different things 1 2 1  0  Trouble relaxing 0 0 0 1  Restless 0 0 0 0  Easily annoyed or irritable 0 1 0 0  Afraid - awful might happen 0 2 0 0  Total GAD 7 Score 2 8 2 2   Anxiety Difficulty Not difficult at all Somewhat difficult      History Patient Active Problem List   Diagnosis Date Noted   OSA on CPAP 04/04/2023   Insomnia 04/04/2023   Hypertension, essential 08/07/2022   Acute prostatitis 06/22/2022   IBS (irritable bowel syndrome)    Past Medical History:  Diagnosis Date   Anxiety    Fatty liver    Gallstone    IBS (irritable bowel syndrome)    Past Surgical History:  Procedure Laterality Date   left ACL arthroscopy     Allergies   Allergen Reactions   Benzonatate  Anaphylaxis   Prior to Admission medications   Medication Sig Start Date End Date Taking? Authorizing Provider  amLODipine  (NORVASC ) 5 MG tablet TAKE 1 TABLET(5 MG) BY MOUTH DAILY 07/18/23   Levora Reyes SAUNDERS, MD  eszopiclone  (LUNESTA ) 1 MG TABS tablet Take 1 tablet (1 mg total) by mouth at bedtime as needed for sleep. Take immediately before bedtime 11/16/23   Olalere, Adewale A, MD  omeprazole  (PRILOSEC) 40 MG capsule Take 1 capsule (40 mg total) by mouth daily. 07/18/23   Mollie Nestor HERO, PA-C   Social History   Socioeconomic History   Marital status: Married    Spouse name: Not on file   Number of children: Not on file   Years of education: Not on file   Highest education level: Some college, no degree  Occupational History   Not on file  Tobacco Use   Smoking status: Former    Current packs/day: 0.00    Average packs/day: 0.5 packs/day for 10.0 years (5.0 ttl pk-yrs)    Types: Cigarettes    Start date: 2008    Quit date: 2018    Years since quitting: 7.6   Smokeless tobacco: Never  Vaping Use  Vaping status: Never Used  Substance and Sexual Activity   Alcohol use: Yes    Alcohol/week: 0.0 standard drinks of alcohol    Comment: beer - only on ocassions   Drug use: No   Sexual activity: Yes  Other Topics Concern   Not on file  Social History Narrative   Caffeine: 1-2 cup daily.   Education:  one yr college.  (Did not finish).   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     Social Drivers of Corporate investment banker Strain: Low Risk  (11/13/2023)   Overall Financial Resource Strain (CARDIA)    Difficulty of Paying Living Expenses: Not very hard  Food Insecurity: No Food Insecurity (11/13/2023)   Hunger Vital Sign    Worried About Running Out of Food in the Last Year: Never true    Ran Out of Food in the Last Year: Never true  Transportation Needs: No Transportation Needs (11/13/2023)   PRAPARE - Administrator, Civil Service  (Medical): No    Lack of Transportation (Non-Medical): No  Physical Activity: Insufficiently Active (11/13/2023)   Exercise Vital Sign    Days of Exercise per Week: 2 days    Minutes of Exercise per Session: 10 min  Stress: Stress Concern Present (11/13/2023)   [name] of Occupational Health - Occupational Stress Questionnaire    Feeling of Stress: To some extent  Social Connections: Socially Isolated (11/13/2023)   Social Connection and Isolation Panel    Frequency of Communication with Friends and Family: Once a week    Frequency of Social Gatherings with Friends and Family: Once a week    Attends Religious Services: Patient declined    Database administrator or Organizations: No    Attends Engineer, structural: Not on file    Marital Status: Married  Catering manager Violence: Not on file    Review of Systems Per HPI.   Objective:   Vitals:   11/24/23 1200  BP: 124/60  Pulse: 80  Temp: 98 F (36.7 C)  TempSrc: Temporal  SpO2: 99%  Weight: 243 lb 12.8 oz (110.6 kg)  Height: 6' 1 (1.854 m)     Physical Exam Vitals reviewed.  Constitutional:      General: He is not in acute distress.    Appearance: Normal appearance. He is well-developed. He is not ill-appearing, toxic-appearing or diaphoretic.  HENT:     Head: Normocephalic and atraumatic.  Neck:     Vascular: No carotid bruit or JVD.  Cardiovascular:     Rate and Rhythm: Normal rate and regular rhythm.     Heart sounds: Normal heart sounds. No murmur heard. Pulmonary:     Effort: Pulmonary effort is normal.     Breath sounds: Normal breath sounds. No rales.  Musculoskeletal:     Right lower leg: No edema.     Left lower leg: No edema.  Skin:    General: Skin is warm and dry.  Neurological:     Mental Status: He is alert and oriented to person, place, and time.     GCS: GCS eye subscore is 4. GCS verbal subscore is 5. GCS motor subscore is 6.     Cranial Nerves: No cranial nerve deficit,  dysarthria or facial asymmetry.     Motor: Motor function is intact. No weakness, atrophy, abnormal muscle tone or pronator drift.     Coordination: Romberg sign negative. Coordination normal. Finger-Nose-Finger Test and Heel to [facility] Test normal. Rapid alternating movements  normal.     Gait: Gait is intact.  Psychiatric:        Mood and Affect: Mood normal.    Equal and intact strength in upper and lower extremities, cap refill less than 1 second at fingertips, warm.  No focal deficits appreciated on exam.   Assessment & Plan:  Micheal Roberts is a 53 y.o. male . Generalized anxiety disorder - Plan: sertraline  (ZOLOFT ) 25 MG tablet  Numbness and tingling of both lower extremities - Plan: CBC, Basic metabolic panel with GFR, TSH, Magnesium, B12  Numbness and tingling of both upper extremities - Plan: CBC, Basic metabolic panel with GFR, TSH, Magnesium, B12  History of anxiety, with persistent symptoms, but has not had dysesthesias previously with anxiety.  Intermittent dysesthesias of lower extremities noted last week then resolved with movement.  Return of dysesthesias earlier this week with some persistent symptoms.  No weakness appreciated.  Differential of cervical, lumbar spine disease with neural impingement but less likely with bilateral symptoms.  No rash.  Given diffuse symptoms we will check some labs above for deficiencies including BMP, CBC, magnesium, TSH.  Close follow-up with recheck in 1 week, ER/urgent care precautions given.  For anxiety, with persistent symptoms he would like to try medication, will start low-dose sertraline , potential side effects discussed and typical timing of improvement.  Follow-up in 1 week with ER/RTC precautions if worsening symptoms sooner.  Meds ordered this encounter  Medications   sertraline  (ZOLOFT ) 25 MG tablet    Sig: Take 1 tablet (25 mg total) by mouth daily.    Dispense:  30 tablet    Refill:  3   Patient Instructions  I will check  some electrolytes and labs to see if these may be abnormal causing your symptoms.  Neurologic exam was reassuring today.  I do not see any need for ER visit or other testing at this time but I would like to follow-up with you in 1 week and see how things are going.  As we discussed anxiety can cause some various symptoms, I think it would be reasonable to start a low-dose medication once per day to help with anxiety but see information below as well.  We can discuss this further next week.  If any new or worsening symptoms over the weekend certainly can be seen through urgent care or ER but I do not expect this to occur.  Hang in there.   Managing Anxiety, Adult After being diagnosed with anxiety, you may be relieved to know why you have felt or behaved a certain way. You may also feel overwhelmed about the treatment ahead and what it will mean for your life. With care and support, you can manage your anxiety. How to manage lifestyle changes Understanding the difference between stress and anxiety Although stress can play a role in anxiety, it is not the same as anxiety. Stress is your body's reaction to life changes and events, both good and bad. Stress is often caused by something external, such as a deadline, test, or competition. It normally goes away after the event has ended and will last just a few hours. But, stress can be ongoing and can lead to more than just stress. Anxiety is caused by something internal, such as imagining a terrible outcome or worrying that something will go wrong that will greatly upset you. Anxiety often does not go away even after the event is over, and it can become a long-term (chronic) worry. Lowering stress and anxiety Talk  with your health care provider or a counselor to learn more about lowering anxiety and stress. They may suggest tension-reduction techniques, such as: Music. Spend time creating or listening to music that you enjoy and that inspires  you. Mindfulness-based meditation. Practice being aware of your normal breaths while not trying to control your breathing. It can be done while sitting or walking. Centering prayer. Focus on a word, phrase, or sacred image that means something to you and brings you peace. Deep breathing. Expand your stomach and inhale slowly through your nose. Hold your breath for 3-5 seconds. Then breathe out slowly, letting your stomach muscles relax. Self-talk. Learn to notice and spot thought patterns that lead to anxiety reactions. Change those patterns to thoughts that feel peaceful. Muscle relaxation. Take time to tense muscles and then relax them. Choose a tension-reduction technique that fits your lifestyle and personality. These techniques take time and practice. Set aside 5-15 minutes a day to do them. Specialized therapists can offer counseling and training in these techniques. The training to help with anxiety may be covered by some insurance plans. Other things you can do to manage stress and anxiety include: Keeping a stress diary. This can help you learn what triggers your reaction and then learn ways to manage your response. Thinking about how you react to certain situations. You may not be able to control everything, but you can control your response. Making time for activities that help you relax and not feeling guilty about spending your time in this way. Doing visual imagery. This involves imagining or creating mental pictures to help you relax. Practicing yoga. Through yoga poses, you can lower tension and relax.  Medicines Medicines for anxiety include: Antidepressant medicines. These are usually prescribed for long-term daily control. Anti-anxiety medicines. These may be added in severe cases, especially when panic attacks occur. When used together, medicines, psychotherapy, and tension-reduction techniques may be the most effective treatment. Relationships Relationships can play a big  part in helping you recover. Spend more time connecting with trusted friends and family members. Think about going to couples counseling if you have a partner, taking family education classes, or going to family therapy. Therapy can help you and others better understand your anxiety. How to recognize changes in your anxiety Everyone responds differently to treatment for anxiety. Recovery from anxiety happens when symptoms lessen and stop interfering with your daily life at home or work. This may mean that you will start to: Have better concentration and focus. Worry will interfere less in your daily thinking. Sleep better. Be less irritable. Have more energy. Have improved memory. Try to recognize when your condition is getting worse. Contact your provider if your symptoms interfere with home or work and you feel like your condition is not improving. Follow these instructions at home: Activity Exercise. Adults should: Exercise for at least 150 minutes each week. The exercise should increase your heart rate and make you sweat (moderate-intensity exercise). Do strengthening exercises at least twice a week. Get the right amount and quality of sleep. Most adults need 7-9 hours of sleep each night. Lifestyle  Eat a healthy diet that includes plenty of vegetables, fruits, whole grains, low-fat dairy products, and lean protein. Do not eat a lot of foods that are high in fats, added sugars, or salt (sodium). Make choices that simplify your life. Do not use any products that contain nicotine or tobacco. These products include cigarettes, chewing tobacco, and vaping devices, such as e-cigarettes. If you need help quitting,  ask your provider. Avoid caffeine, alcohol, and certain over-the-counter cold medicines. These may make you feel worse. Ask your pharmacist which medicines to avoid. General instructions Take over-the-counter and prescription medicines only as told by your provider. Keep all  follow-up visits. This is to make sure you are managing your anxiety well or if you need more support. Where to find support You can get help and support from: Self-help groups. Online and [employer]. A trusted spiritual leader. Couples counseling. Family education classes. Family therapy. Where to find more information You may find that joining a support group helps you deal with your anxiety. The following sources can help you find counselors or support groups near you: Mental Health America: mentalhealthamerica.net Anxiety and Depression Association of [place] (ADAA): adaa.org The First American on Mental Illness (NAMI): nami.org Contact a health care provider if: You have a hard time staying focused or finishing tasks. You spend many hours a day feeling worried about everyday life. You are very tired because you cannot stop worrying. You start to have headaches or often feel tense. You have chronic nausea or diarrhea. Get help right away if: Your heart feels like it is racing. You have shortness of breath. You have thoughts of hurting yourself or others. Get help right away if you feel like you may hurt yourself or others, or have thoughts about taking your own life. Go to your nearest emergency room or: Call 911. Call the National Suicide Prevention Lifeline at 937 523 8033 or 988. This is open 24 hours a day. Text the Crisis Text Line at 971 264 3164. This information is not intended to replace advice given to you by your health care provider. Make sure you discuss any questions you have with your health care provider. Document Revised: 01/11/2022 Document Reviewed: 07/26/2020 Elsevier Patient Education  2024 Elsevier Inc.    Signed,   Reyes Pines, MD [facility], [facility] 11/24/23 12:32 PM

## 2023-11-24 NOTE — Patient Instructions (Signed)
 I will check some electrolytes and labs to see if these may be abnormal causing your symptoms.  Neurologic exam was reassuring today.  I do not see any need for ER visit or other testing at this time but I would like to follow-up with you in 1 week and see how things are going.  As we discussed anxiety can cause some various symptoms, I think it would be reasonable to start a low-dose medication once per day to help with anxiety but see information below as well.  We can discuss this further next week.  If any new or worsening symptoms over the weekend certainly can be seen through urgent care or ER but I do not expect this to occur.  Hang in there.   Managing Anxiety, Adult After being diagnosed with anxiety, you may be relieved to know why you have felt or behaved a certain way. You may also feel overwhelmed about the treatment ahead and what it will mean for your life. With care and support, you can manage your anxiety. How to manage lifestyle changes Understanding the difference between stress and anxiety Although stress can play a role in anxiety, it is not the same as anxiety. Stress is your body's reaction to life changes and events, both good and bad. Stress is often caused by something external, such as a deadline, test, or competition. It normally goes away after the event has ended and will last just a few hours. But, stress can be ongoing and can lead to more than just stress. Anxiety is caused by something internal, such as imagining a terrible outcome or worrying that something will go wrong that will greatly upset you. Anxiety often does not go away even after the event is over, and it can become a long-term (chronic) worry. Lowering stress and anxiety Talk with your health care provider or a counselor to learn more about lowering anxiety and stress. They may suggest tension-reduction techniques, such as: Music. Spend time creating or listening to music that you enjoy and that inspires  you. Mindfulness-based meditation. Practice being aware of your normal breaths while not trying to control your breathing. It can be done while sitting or walking. Centering prayer. Focus on a word, phrase, or sacred image that means something to you and brings you peace. Deep breathing. Expand your stomach and inhale slowly through your nose. Hold your breath for 3-5 seconds. Then breathe out slowly, letting your stomach muscles relax. Self-talk. Learn to notice and spot thought patterns that lead to anxiety reactions. Change those patterns to thoughts that feel peaceful. Muscle relaxation. Take time to tense muscles and then relax them. Choose a tension-reduction technique that fits your lifestyle and personality. These techniques take time and practice. Set aside 5-15 minutes a day to do them. Specialized therapists can offer counseling and training in these techniques. The training to help with anxiety may be covered by some insurance plans. Other things you can do to manage stress and anxiety include: Keeping a stress diary. This can help you learn what triggers your reaction and then learn ways to manage your response. Thinking about how you react to certain situations. You may not be able to control everything, but you can control your response. Making time for activities that help you relax and not feeling guilty about spending your time in this way. Doing visual imagery. This involves imagining or creating mental pictures to help you relax. Practicing yoga. Through yoga poses, you can lower tension and relax.  Medicines Medicines for anxiety include: Antidepressant medicines. These are usually prescribed for long-term daily control. Anti-anxiety medicines. These may be added in severe cases, especially when panic attacks occur. When used together, medicines, psychotherapy, and tension-reduction techniques may be the most effective treatment. Relationships Relationships can play a big  part in helping you recover. Spend more time connecting with trusted friends and family members. Think about going to couples counseling if you have a partner, taking family education classes, or going to family therapy. Therapy can help you and others better understand your anxiety. How to recognize changes in your anxiety Everyone responds differently to treatment for anxiety. Recovery from anxiety happens when symptoms lessen and stop interfering with your daily life at home or work. This may mean that you will start to: Have better concentration and focus. Worry will interfere less in your daily thinking. Sleep better. Be less irritable. Have more energy. Have improved memory. Try to recognize when your condition is getting worse. Contact your provider if your symptoms interfere with home or work and you feel like your condition is not improving. Follow these instructions at home: Activity Exercise. Adults should: Exercise for at least 150 minutes each week. The exercise should increase your heart rate and make you sweat (moderate-intensity exercise). Do strengthening exercises at least twice a week. Get the right amount and quality of sleep. Most adults need 7-9 hours of sleep each night. Lifestyle  Eat a healthy diet that includes plenty of vegetables, fruits, whole grains, low-fat dairy products, and lean protein. Do not eat a lot of foods that are high in fats, added sugars, or salt (sodium). Make choices that simplify your life. Do not use any products that contain nicotine or tobacco. These products include cigarettes, chewing tobacco, and vaping devices, such as e-cigarettes. If you need help quitting, ask your provider. Avoid caffeine, alcohol, and certain over-the-counter cold medicines. These may make you feel worse. Ask your pharmacist which medicines to avoid. General instructions Take over-the-counter and prescription medicines only as told by your provider. Keep all  follow-up visits. This is to make sure you are managing your anxiety well or if you need more support. Where to find support You can get help and support from: Self-help groups. Online and [employer]. A trusted spiritual leader. Couples counseling. Family education classes. Family therapy. Where to find more information You may find that joining a support group helps you deal with your anxiety. The following sources can help you find counselors or support groups near you: Mental Health America: mentalhealthamerica.net Anxiety and Depression Association of [place] (ADAA): adaa.org The First American on Mental Illness (NAMI): nami.org Contact a health care provider if: You have a hard time staying focused or finishing tasks. You spend many hours a day feeling worried about everyday life. You are very tired because you cannot stop worrying. You start to have headaches or often feel tense. You have chronic nausea or diarrhea. Get help right away if: Your heart feels like it is racing. You have shortness of breath. You have thoughts of hurting yourself or others. Get help right away if you feel like you may hurt yourself or others, or have thoughts about taking your own life. Go to your nearest emergency room or: Call 911. Call the National Suicide Prevention Lifeline at 509-270-1322 or 988. This is open 24 hours a day. Text the Crisis Text Line at 639-720-5006. This information is not intended to replace advice given to you by your health care provider. Make sure  you discuss any questions you have with your health care provider. Document Revised: 01/11/2022 Document Reviewed: 07/26/2020 Elsevier Patient Education  2024 [employer].

## 2023-11-26 ENCOUNTER — Ambulatory Visit: Payer: Self-pay | Admitting: Family Medicine

## 2023-11-30 ENCOUNTER — Ambulatory Visit: Admitting: Family Medicine

## 2023-12-02 ENCOUNTER — Other Ambulatory Visit: Payer: Self-pay | Admitting: Family Medicine

## 2023-12-02 DIAGNOSIS — L299 Pruritus, unspecified: Secondary | ICD-10-CM

## 2023-12-02 DIAGNOSIS — F418 Other specified anxiety disorders: Secondary | ICD-10-CM

## 2023-12-04 NOTE — Telephone Encounter (Signed)
 Reported not taking in April of 2025, called patient to confirm if he requested this refill or if he is still not taking this? No answer, LM to call back so we can confirm status of the medication

## 2023-12-06 ENCOUNTER — Ambulatory Visit: Admitting: Family Medicine

## 2023-12-06 ENCOUNTER — Encounter: Payer: Self-pay | Admitting: Family Medicine

## 2023-12-06 VITALS — BP 130/78 | HR 62 | Temp 98.5°F | Resp 15 | Ht 73.0 in | Wt 246.6 lb

## 2023-12-06 DIAGNOSIS — R200 Anesthesia of skin: Secondary | ICD-10-CM | POA: Diagnosis not present

## 2023-12-06 DIAGNOSIS — F411 Generalized anxiety disorder: Secondary | ICD-10-CM

## 2023-12-06 DIAGNOSIS — R202 Paresthesia of skin: Secondary | ICD-10-CM

## 2023-12-06 MED ORDER — BUSPIRONE HCL 7.5 MG PO TABS: 7.5000 mg | ORAL_TABLET | Freq: Two times a day (BID) | ORAL | 3 refills | Status: AC

## 2023-12-06 NOTE — Patient Instructions (Signed)
 Thanks for coming today.  Sorry to hear that you did not tolerate the sertraline .  I did send in a different medication that can treat anxiety, called buspirone .  Can start that once per day initially if persistent anxiety, then if tolerated can increase it to twice per day.  If you have any side effects or issues with that medication let me know but it looks like we did try that medicine about 11 years ago.  Glad to hear that the tingling in the arms, legs has resolved.  If the symptoms return, follow-up and we can discuss other testing.  Otherwise follow-up in 6 weeks to recheck anxiety.  Take care!

## 2023-12-06 NOTE — Progress Notes (Signed)
 Subjective:  Patient ID: Micheal Roberts, male    DOB: 05-29-1970  Age: 53 y.o. MRN: [id number]  CC:  Chief Complaint  Patient presents with   Anxiety    Pt notes does not like sertraline  he was throwing up and so patient stopped     HPI Micheal Roberts presents for   Generalized anxiety disorder With dysesthesias.  Follow-up from 11/24/2023 visit.  Tingling in arms, legs, hands at that time.  Initially symptoms started when he woke up 10 days prior then again 4 days prior.  Denied weakness perioral numbness or any face symptoms.  Denied new headache.  Did have some persistent anxiety, and did want to start new medicine last visit, low-dose sertraline  25 mg started.  TSH, CBC, BMP, B12 and magnesium were all normal.  Since last visit noticed episode of vomiting so stopped medication. Noticed sick feeling on stomach few ours after taking - few episodes. No repeat dose.   Tingling resolved. No recurrence since last visit.      11/13/2023   12:27 PM 11/13/2023   12:26 PM 05/18/2023   10:13 AM 03/22/2023    1:13 PM  GAD 7 : Generalized Anxiety Score  Nervous, Anxious, on Edge 0 2 0 1  Control/stop worrying 1 1 1  0  Worry too much - different things 1 2 1  0  Trouble relaxing 0 0 0 1  Restless 0 0 0 0  Easily annoyed or irritable 0 1 0 0  Afraid - awful might happen 0 2 0 0  Total GAD 7 Score 2 8 2 2   Anxiety Difficulty Not difficult at all Somewhat difficult        History Patient Active Problem List   Diagnosis Date Noted   OSA on CPAP 04/04/2023   Insomnia 04/04/2023   Hypertension, essential 08/07/2022   Acute prostatitis 06/22/2022   IBS (irritable bowel syndrome)    Past Medical History:  Diagnosis Date   Anxiety    Fatty liver    Gallstone    IBS (irritable bowel syndrome)    Past Surgical History:  Procedure Laterality Date   left ACL arthroscopy     Allergies  Allergen Reactions   Benzonatate  Anaphylaxis   Prior to Admission medications   Medication Sig  Start Date End Date Taking? Authorizing Provider  amLODipine  (NORVASC ) 5 MG tablet TAKE 1 TABLET(5 MG) BY MOUTH DAILY 07/18/23  Yes Levora Reyes SAUNDERS, MD  eszopiclone  (LUNESTA ) 1 MG TABS tablet Take 1 tablet (1 mg total) by mouth at bedtime as needed for sleep. Take immediately before bedtime 11/16/23  Yes Olalere, Adewale A, MD  omeprazole  (PRILOSEC) 40 MG capsule Take 1 capsule (40 mg total) by mouth daily. 07/18/23  Yes McMichael, Bayley M, PA-C  sertraline  (ZOLOFT ) 25 MG tablet Take 1 tablet (25 mg total) by mouth daily. Patient not taking: Reported on 12/06/2023 11/24/23   Levora Reyes SAUNDERS, MD   Social History   Socioeconomic History   Marital status: Married    Spouse name: Not on file   Number of children: Not on file   Years of education: Not on file   Highest education level: Some college, no degree  Occupational History   Not on file  Tobacco Use   Smoking status: Former    Current packs/day: 0.00    Average packs/day: 0.5 packs/day for 10.0 years (5.0 ttl pk-yrs)    Types: Cigarettes    Start date: 2008    Quit date: 2018  Years since quitting: 7.6   Smokeless tobacco: Never  Vaping Use   Vaping status: Never Used  Substance and Sexual Activity   Alcohol use: Yes    Alcohol/week: 0.0 standard drinks of alcohol    Comment: beer - only on ocassions   Drug use: No   Sexual activity: Yes  Other Topics Concern   Not on file  Social History Narrative   Caffeine: 1-2 cup daily.   Education:  one yr college.  (Did not finish).   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     Social Drivers of Corporate investment banker Strain: Low Risk  (11/13/2023)   Overall Financial Resource Strain (CARDIA)    Difficulty of Paying Living Expenses: Not very hard  Food Insecurity: No Food Insecurity (11/13/2023)   Hunger Vital Sign    Worried About Running Out of Food in the Last Year: Never true    Ran Out of Food in the Last Year: Never true  Transportation Needs: No Transportation Needs (11/13/2023)    PRAPARE - Administrator, Civil Service (Medical): No    Lack of Transportation (Non-Medical): No  Physical Activity: Insufficiently Active (11/13/2023)   Exercise Vital Sign    Days of Exercise per Week: 2 days    Minutes of Exercise per Session: 10 min  Stress: Stress Concern Present (11/13/2023)   [name] of Occupational Health - Occupational Stress Questionnaire    Feeling of Stress: To some extent  Social Connections: Socially Isolated (11/13/2023)   Social Connection and Isolation Panel    Frequency of Communication with Friends and Family: Once a week    Frequency of Social Gatherings with Friends and Family: Once a week    Attends Religious Services: Patient declined    Database administrator or Organizations: No    Attends Engineer, structural: Not on file    Marital Status: Married  Catering manager Violence: Not on file    Review of Systems  Per HPI.  Objective:   Vitals:   12/06/23 0833  BP: 130/78  Pulse: 62  Resp: 15  Temp: 98.5 F (36.9 C)  TempSrc: Temporal  SpO2: 98%  Weight: 246 lb 9.6 oz (111.9 kg)  Height: 6' 1 (1.854 m)     Physical Exam Vitals reviewed.  Constitutional:      Appearance: He is well-developed.  HENT:     Head: Normocephalic and atraumatic.  Neck:     Vascular: No carotid bruit or JVD.  Cardiovascular:     Rate and Rhythm: Normal rate and regular rhythm.     Heart sounds: Normal heart sounds. No murmur heard. Pulmonary:     Effort: Pulmonary effort is normal.     Breath sounds: Normal breath sounds. No rales.  Musculoskeletal:     Right lower leg: No edema.     Left lower leg: No edema.  Skin:    General: Skin is warm and dry.  Neurological:     Mental Status: He is alert and oriented to person, place, and time.  Psychiatric:        Mood and Affect: Mood normal.        Assessment & Plan:  Micheal Roberts is a 53 y.o. male . Numbness and tingling of both lower extremities Numbness  and tingling of both upper extremities  - resolved. No recurrence. Prior labs and exam were reassuring. Rtc precautions if recurs.  Generalized anxiety disorder - Plan: busPIRone  (BUSPAR ) 7.5 MG tablet  -  Unfortunately did not tolerate sertraline .  Option of low-dose buspirone .  It does appear he may have taken this approximately Levan years ago but I do not see any notation of any issue with that medication.  Low-dose initially with option of twice daily dosing.  Has previous information on management of anxiety from prior after visit summary.  Recheck 6 weeks.  Meds ordered this encounter  Medications   busPIRone  (BUSPAR ) 7.5 MG tablet    Sig: Take 1 tablet (7.5 mg total) by mouth 2 (two) times daily. Start once per day initial week and increase to BID if tolerated.    Dispense:  60 tablet    Refill:  3   Patient Instructions  Thanks for coming today.  Sorry to hear that you did not tolerate the sertraline .  I did send in a different medication that can treat anxiety, called buspirone .  Can start that once per day initially if persistent anxiety, then if tolerated can increase it to twice per day.  If you have any side effects or issues with that medication let me know but it looks like we did try that medicine about 11 years ago.  Glad to hear that the tingling in the arms, legs has resolved.  If the symptoms return, follow-up and we can discuss other testing.  Otherwise follow-up in 6 weeks to recheck anxiety.  Take care!    Signed,   Reyes Pines, MD [facility], [facility] 12/06/23 8:56 AM

## 2023-12-19 ENCOUNTER — Ambulatory Visit

## 2023-12-20 ENCOUNTER — Ambulatory Visit

## 2023-12-21 ENCOUNTER — Ambulatory Visit

## 2023-12-21 DIAGNOSIS — Z23 Encounter for immunization: Secondary | ICD-10-CM | POA: Diagnosis not present

## 2023-12-21 DIAGNOSIS — K760 Fatty (change of) liver, not elsewhere classified: Secondary | ICD-10-CM

## 2023-12-21 DIAGNOSIS — R7989 Other specified abnormal findings of blood chemistry: Secondary | ICD-10-CM

## 2023-12-28 ENCOUNTER — Ambulatory Visit: Admitting: Nurse Practitioner

## 2024-01-03 ENCOUNTER — Encounter: Payer: Self-pay | Admitting: Nurse Practitioner

## 2024-01-03 ENCOUNTER — Telehealth (INDEPENDENT_AMBULATORY_CARE_PROVIDER_SITE_OTHER): Admitting: Nurse Practitioner

## 2024-01-03 ENCOUNTER — Other Ambulatory Visit: Payer: Self-pay | Admitting: Gastroenterology

## 2024-01-03 DIAGNOSIS — G4700 Insomnia, unspecified: Secondary | ICD-10-CM | POA: Diagnosis not present

## 2024-01-03 DIAGNOSIS — Z87891 Personal history of nicotine dependence: Secondary | ICD-10-CM | POA: Diagnosis not present

## 2024-01-03 DIAGNOSIS — G4733 Obstructive sleep apnea (adult) (pediatric): Secondary | ICD-10-CM

## 2024-01-03 DIAGNOSIS — F5101 Primary insomnia: Secondary | ICD-10-CM

## 2024-01-03 NOTE — Progress Notes (Signed)
 Patient ID: Micheal Roberts, male     DOB: Dec 28, 1970, 53 y.o.      MRN: [id number]  No chief complaint on file.   Virtual Visit via Video Note  I connected with Micheal Roberts on 01/03/24 at  2:00 PM EDT by a video enabled telemedicine application and verified that I am speaking with the correct person using two identifiers.  Location: Patient: Home Provider: Office   I discussed the limitations of evaluation and management by telemedicine and the availability of in person appointments. The patient expressed understanding and agreed to proceed.  History of Present Illness: 53 year old male, former smoker followed for OSA on CPAP and insomnia. He is a patient of Dr. Cathye and last seen in office 08/02/2023 by [facility] NP. Past medical history significant for HTN, IBS.    TEST/EVENTS:  09/19/2022 HST: AHI 46.8, SpO2 low 78%   08/19/2022: OV with Dr. Neda. Has been told he stops breathing. Some gasping/choking. Longstanding history of snoring. Takes hours to fall asleep. About 4 awakenings. Recently started on antihypertensives. HST ordered.    04/04/2023: OV with Eliav Mechling NP for follow-up.  He was started on CPAP about 4 months ago.  Feeling like he is sleeping better with it.  Does notice a difference on the nights that he does not sleep with it.  He still struggles with sleep latency.  Can take him hours to fall asleep some nights.  This causes him to not have enough sleep at night.  He usually averages about 5 to 6 hours.  Still feels like his energy levels are not the best during the day.  He does occasionally feel like he does not get enough air when wearing the CPAP.  Wearing a fullface mask.  Denies any issues with drowsy driving, morning headaches or sleep parasomnia/paralysis.  He has tried over-the-counter sleep aids which have not been very effective.  He does have hydroxyzine  for anxiety but this does not seem to really help him fall asleep at night.  Does not take it on a consistent  basis. 03/04/2023-04/02/2023: CPAP 5-20 cmH2O 27/30 days; 83% >4 hr; average use 5 hr 51 min Pressure 12.7 Leaks 95th 13.7 AHI 3.5    08/02/2023: OV with Patsye Sullivant NP Patient presents today with his wife for overdue follow up. At his last visit, we had started him on lunesta  for insomnia despite CPAP use. He tells me today that when he uses the lunesta , he is able to go to sleep and stay asleep longer. Without it, he has nights he doesn't sleep at all. He does have to take 2 tablets some nights but otherwise doing well with 1. He did run out of this so his PCP sent in a refill with 15 tablets. He will need another refill after this. He denies any sleep parasomnias, morning hangover, or mood changes. No aberrant behavior. Regarding his CPAP, his settings were somehow changed to a set pressure of 10 cmH2O even though the order was placed for it to be adjusted to 10-15 cmH2O. He doesn't feel like he gets enough air sometimes. He thinks his pressures need to come up. This has led to him taking it off some nights. Other nights, he has trouble actually sleeping. His wife and he agree that he does better with the CPAP. He doesn't have snoring with the CPAP. His energy levels are better when he sleeps normally and wears his CPAP. No drowsy driving.  6/72/7974-5/84/7974 (partial download d/t setting change): CPAP 10  cmH2O 16/20 days; 13 days >4 hr; average use 5 hr 53 min Leaks 95h 9.5 AHI 4.4  01/03/2024: Today - follow up Patient presents today for follow up. He's been doing well with his CPAP for the most part. He still feels like he could use more pressure. Wears it most nights. Does feel better with his CPAP than without. No drowsy driving. Uses his lunesta  most nights. Skips the weekends he doesn't work. Works well for him. No side effects. No morning hangover or mood changes. Sleeps more restfully with it and is able to stay asleep. Taking 1 mg.  12/04/2023-01/02/2024: CPAP 10-15 cmH2O 25/30 days; 73% >4  hr; average use 5 hr 26 min Pressure 95th 13.1 Leaks 95th 17.5 AHI 2.8  Allergies  Allergen Reactions   Benzonatate  Anaphylaxis   Immunization History  Administered Date(s) Administered   Hepatitis A, Adult 11/17/2023   Hepb-cpg 11/17/2023, 12/21/2023   Influenza, Seasonal, Injecte, Preservative Fre 02/02/2023   Influenza,inj,Quad PF,6+ Mos 05/25/2015, 06/21/2016, 04/05/2018, 04/25/2019, 03/18/2021, 02/24/2022   PFIZER(Purple Top)SARS-COV-2 Vaccination 06/22/2019, 11/06/2020   Tdap 09/03/2020   Zoster Recombinant(Shingrix) 03/18/2021   Past Medical History:  Diagnosis Date   Anxiety    Fatty liver    Gallstone    IBS (irritable bowel syndrome)     Tobacco History: Social History   Tobacco Use  Smoking Status Former   Current packs/day: 0.00   Average packs/day: 0.5 packs/day for 10.0 years (5.0 ttl pk-yrs)   Types: Cigarettes   Start date: 2008   Quit date: 2018   Years since quitting: 7.7  Smokeless Tobacco Never   Counseling given: Not Answered   Outpatient Medications Prior to Visit  Medication Sig Dispense Refill   amLODipine  (NORVASC ) 5 MG tablet TAKE 1 TABLET(5 MG) BY MOUTH DAILY 90 tablet 1   busPIRone  (BUSPAR ) 7.5 MG tablet Take 1 tablet (7.5 mg total) by mouth 2 (two) times daily. Start once per day initial week and increase to BID if tolerated. 60 tablet 3   eszopiclone  (LUNESTA ) 1 MG TABS tablet Take 1 tablet (1 mg total) by mouth at bedtime as needed for sleep. Take immediately before bedtime 30 tablet 5   omeprazole  (PRILOSEC) 40 MG capsule Take 1 capsule (40 mg total) by mouth daily. 90 capsule 1   No facility-administered medications prior to visit.     Review of Systems: As above   Observations/Objective: Patient is well-developed, well-nourished in no acute distress.  Resting comfortably at home.  No labored breathing.  Speech is clear and coherent with logical content.  Patient is alert and oriented at baseline.   Assessment and  Plan: OSA on CPAP Severe OSA on CPAP. Improved compliance. Difficulties multifactorial related to insomnia and pressure issues. Will send an order to the DME to update his settings to auto 13-18 cmH2O and also adjusted this in AirView today. He will notify if this does not help. Encouraged him to utilize nightly. Reviewed risks of untreated OSA. Aware of proper care/use. Safe driving practices reviewed.   Patient Instructions  Continue to use CPAP every night, minimum of 4-6 hours a night.  Change equipment as directed. Wash your tubing with warm soap and water daily, hang to dry. Wash humidifier portion weekly. Use bottled, distilled water and change daily Be aware of reduced alertness and do not drive or operate heavy machinery if experiencing this or drowsiness.  Exercise encouraged, as tolerated. Healthy weight management discussed.  Avoid or decrease alcohol consumption and medications that make you  more sleepy, if possible. Notify if persistent daytime sleepiness occurs even with consistent use of PAP therapy.   We discussed how untreated sleep apnea puts an individual at risk for cardiac arrhthymias, pulm HTN, DM, stroke and increases their risk for daytime accidents.    Changed settings back to auto set at 10-15 cmH2O, expiratory support (EPR) at level 3 and adjusted your ramp pressure up to 8 cmH2O. Let me know if you still feel like you're not getting enough air on this or if you can't tell the settings changed so I can check with the medical supply company    Continue lunesta  1 mg At bedtime as needed for sleep. Take immediately before bed. Ensure you have 7-8 hours in the bed after taking. Monitor for any mood changes or changes in sleep habits. Stop and notify immediately if these occur. Do not drive or operate heavy machinery after taking. Do not take with alcohol or other sedating medications. Ensure you apply your CPAP within 5-10 minutes of taking to avoid falling asleep without it.  May cause some morning grogginess or vivid dreams.    Follow up in 6 months with Dr. Neda or Izetta Malachy PIETY. If symptoms do not improve or worsen, please contact office for sooner follow up or seek emergency care.    Insomnia Improved with use of eszopiclone  (Lunesta ). Tolerating well and appears to be effective for him. No aberrant behavior. He understands side effect/safety profile. Aware to apply CPAP after taking. Sleep hygiene reviewed.       I discussed the assessment and treatment plan with the patient. The patient was provided an opportunity to ask questions and all were answered. The patient agreed with the plan and demonstrated an understanding of the instructions.   The patient was advised to call back or seek an in-person evaluation if the symptoms worsen or if the condition fails to improve as anticipated.  I provided 31 minutes of non-face-to-face time during this encounter.   Comer LULLA Malachy, NP

## 2024-01-03 NOTE — Assessment & Plan Note (Signed)
 Improved with use of eszopiclone  (Lunesta ). Tolerating well and appears to be effective for him. No aberrant behavior. He understands side effect/safety profile. Aware to apply CPAP after taking. Sleep hygiene reviewed.

## 2024-01-03 NOTE — Patient Instructions (Signed)
 Continue to use CPAP every night, minimum of 4-6 hours a night.  Change equipment as directed. Wash your tubing with warm soap and water daily, hang to dry. Wash humidifier portion weekly. Use bottled, distilled water and change daily Be aware of reduced alertness and do not drive or operate heavy machinery if experiencing this or drowsiness.  Exercise encouraged, as tolerated. Healthy weight management discussed.  Avoid or decrease alcohol consumption and medications that make you more sleepy, if possible. Notify if persistent daytime sleepiness occurs even with consistent use of PAP therapy.   We discussed how untreated sleep apnea puts an individual at risk for cardiac arrhthymias, pulm HTN, DM, stroke and increases their risk for daytime accidents.    Changed settings back to auto set at 10-15 cmH2O, expiratory support (EPR) at level 3 and adjusted your ramp pressure up to 8 cmH2O. Let me know if you still feel like you're not getting enough air on this or if you can't tell the settings changed so I can check with the medical supply company    Continue lunesta  1 mg At bedtime as needed for sleep. Take immediately before bed. Ensure you have 7-8 hours in the bed after taking. Monitor for any mood changes or changes in sleep habits. Stop and notify immediately if these occur. Do not drive or operate heavy machinery after taking. Do not take with alcohol or other sedating medications. Ensure you apply your CPAP within 5-10 minutes of taking to avoid falling asleep without it. May cause some morning grogginess or vivid dreams.    Follow up in 6 months with Dr. Neda or Izetta Malachy PIETY. If symptoms do not improve or worsen, please contact office for sooner follow up or seek emergency care.

## 2024-01-03 NOTE — Assessment & Plan Note (Signed)
 Severe OSA on CPAP. Improved compliance. Difficulties multifactorial related to insomnia and pressure issues. Will send an order to the DME to update his settings to auto 13-18 cmH2O and also adjusted this in AirView today. He will notify if this does not help. Encouraged him to utilize nightly. Reviewed risks of untreated OSA. Aware of proper care/use. Safe driving practices reviewed.   Patient Instructions  Continue to use CPAP every night, minimum of 4-6 hours a night.  Change equipment as directed. Wash your tubing with warm soap and water daily, hang to dry. Wash humidifier portion weekly. Use bottled, distilled water and change daily Be aware of reduced alertness and do not drive or operate heavy machinery if experiencing this or drowsiness.  Exercise encouraged, as tolerated. Healthy weight management discussed.  Avoid or decrease alcohol consumption and medications that make you more sleepy, if possible. Notify if persistent daytime sleepiness occurs even with consistent use of PAP therapy.   We discussed how untreated sleep apnea puts an individual at risk for cardiac arrhthymias, pulm HTN, DM, stroke and increases their risk for daytime accidents.    Changed settings back to auto set at 10-15 cmH2O, expiratory support (EPR) at level 3 and adjusted your ramp pressure up to 8 cmH2O. Let me know if you still feel like you're not getting enough air on this or if you can't tell the settings changed so I can check with the medical supply company    Continue lunesta  1 mg At bedtime as needed for sleep. Take immediately before bed. Ensure you have 7-8 hours in the bed after taking. Monitor for any mood changes or changes in sleep habits. Stop and notify immediately if these occur. Do not drive or operate heavy machinery after taking. Do not take with alcohol or other sedating medications. Ensure you apply your CPAP within 5-10 minutes of taking to avoid falling asleep without it. May cause some  morning grogginess or vivid dreams.    Follow up in 6 months with Dr. Neda or Izetta Malachy PIETY. If symptoms do not improve or worsen, please contact office for sooner follow up or seek emergency care.

## 2024-01-11 ENCOUNTER — Encounter: Payer: Self-pay | Admitting: *Deleted

## 2024-01-11 ENCOUNTER — Other Ambulatory Visit: Payer: Self-pay | Admitting: *Deleted

## 2024-01-11 DIAGNOSIS — R7989 Other specified abnormal findings of blood chemistry: Secondary | ICD-10-CM

## 2024-01-11 DIAGNOSIS — K760 Fatty (change of) liver, not elsewhere classified: Secondary | ICD-10-CM

## 2024-01-12 ENCOUNTER — Other Ambulatory Visit: Payer: Self-pay | Admitting: Family Medicine

## 2024-01-12 DIAGNOSIS — I10 Essential (primary) hypertension: Secondary | ICD-10-CM

## 2024-02-26 ENCOUNTER — Ambulatory Visit: Admitting: Nurse Practitioner

## 2024-04-25 ENCOUNTER — Encounter (HOSPITAL_BASED_OUTPATIENT_CLINIC_OR_DEPARTMENT_OTHER): Payer: Self-pay | Admitting: Emergency Medicine

## 2024-04-25 ENCOUNTER — Other Ambulatory Visit: Payer: Self-pay

## 2024-04-25 ENCOUNTER — Emergency Department (HOSPITAL_BASED_OUTPATIENT_CLINIC_OR_DEPARTMENT_OTHER): Admitting: Radiology

## 2024-04-25 DIAGNOSIS — I10 Essential (primary) hypertension: Secondary | ICD-10-CM | POA: Diagnosis not present

## 2024-04-25 DIAGNOSIS — Z79899 Other long term (current) drug therapy: Secondary | ICD-10-CM | POA: Diagnosis not present

## 2024-04-25 DIAGNOSIS — R739 Hyperglycemia, unspecified: Secondary | ICD-10-CM | POA: Diagnosis not present

## 2024-04-25 DIAGNOSIS — R0789 Other chest pain: Secondary | ICD-10-CM | POA: Insufficient documentation

## 2024-04-25 DIAGNOSIS — R0602 Shortness of breath: Secondary | ICD-10-CM | POA: Diagnosis present

## 2024-04-25 LAB — CBC
HCT: 37.1 % — ABNORMAL LOW (ref 39.0–52.0)
Hemoglobin: 12.8 g/dL — ABNORMAL LOW (ref 13.0–17.0)
MCH: 30.5 pg (ref 26.0–34.0)
MCHC: 34.5 g/dL (ref 30.0–36.0)
MCV: 88.3 fL (ref 80.0–100.0)
Platelets: 237 K/uL (ref 150–400)
RBC: 4.2 MIL/uL — ABNORMAL LOW (ref 4.22–5.81)
RDW: 12.7 % (ref 11.5–15.5)
WBC: 6.5 K/uL (ref 4.0–10.5)
nRBC: 0 % (ref 0.0–0.2)

## 2024-04-25 LAB — BASIC METABOLIC PANEL WITH GFR
Anion gap: 14 (ref 5–15)
BUN: 12 mg/dL (ref 6–20)
CO2: 25 mmol/L (ref 22–32)
Calcium: 9.5 mg/dL (ref 8.9–10.3)
Chloride: 101 mmol/L (ref 98–111)
Creatinine, Ser: 1.29 mg/dL — ABNORMAL HIGH (ref 0.61–1.24)
GFR, Estimated: >60 mL/min
Glucose, Bld: 142 mg/dL — ABNORMAL HIGH (ref 70–99)
Potassium: 3.6 mmol/L (ref 3.5–5.1)
Sodium: 140 mmol/L (ref 135–145)

## 2024-04-25 NOTE — ED Triage Notes (Signed)
 Patient c/o on and off sob that started this morning.  Patient speaking in complete sentences in triage, in NAD.  Patient denies pain.

## 2024-04-26 ENCOUNTER — Emergency Department (HOSPITAL_BASED_OUTPATIENT_CLINIC_OR_DEPARTMENT_OTHER)
Admission: EM | Admit: 2024-04-26 | Discharge: 2024-04-26 | Disposition: A | Discharge status: Home / Self Care | Attending: Emergency Medicine | Admitting: Emergency Medicine

## 2024-04-26 DIAGNOSIS — R0789 Other chest pain: Secondary | ICD-10-CM

## 2024-04-26 DIAGNOSIS — R0602 Shortness of breath: Secondary | ICD-10-CM

## 2024-04-26 LAB — TROPONIN T, HIGH SENSITIVITY: Troponin T High Sensitivity: <15 ng/L (ref 0–19)

## 2024-04-26 MED ORDER — ALUM & MAG HYDROXIDE-SIMETH 200-200-20 MG/5ML PO SUSP
30.0000 mL | Freq: Once | ORAL | Status: AC
  Administered 2024-04-26: 30 mL via ORAL
  Filled 2024-04-26: qty 30

## 2024-04-26 MED ORDER — LIDOCAINE VISCOUS HCL 2 % MT SOLN
15.0000 mL | Freq: Once | OROMUCOSAL | Status: AC
  Administered 2024-04-26: 15 mL via ORAL
  Filled 2024-04-26: qty 15

## 2024-04-26 NOTE — ED Provider Notes (Signed)
 " [place] EMERGENCY DEPARTMENT AT [facility] Provider Note  CSN: [id number] Arrival date & time: 04/25/24 2248  Chief Complaint(s) Shortness of Breath  History provided by patient. HPI & MDM Micheal Roberts is a 54 y.o. male .   Shortness of Breath  Intermittent throughout the day. Constant since 5p. No chest pain, but feels indigestion No fever, cough, congestion. No leg swelling. No alleviating or aggravating factors. No leg pain or swelling.  No recent travel.  No hormone replacement therapy.  No prior history of DVT/PE.  No personal history of cancer.   Medical Decision Making Amount and/or Complexity of Data Reviewed Labs: ordered. Decision-making details documented in ED Course. Radiology: ordered and independent interpretation performed. Decision-making details documented in ED Course. ECG/medicine tests: ordered and independent interpretation performed. Decision-making details documented in ED Course.  Risk OTC drugs. Prescription drug management.    Shortness of breath differential diagnosis considered.  Workup below Patient also with indigestion.  Chest pain workup initiated.  EKG without acute ischemic changes, dysrhythmias or blocks. Troponin was negative at 11-hour mark of constant symptom.  Feel this is sufficient to rule out ACS in this clinical picture.  Chest x-ray without evidence of pneumonia, pneumothorax, pulmonary edema pleural effusions.  CBC without leukocytosis or anemia. Metabolic panel without significant electrolyte derangements.  No renal insufficiency.  Mild hyperglycemia without DKA.  Pretest probability for PE is low.  Unlikely PE.  Presentation is not classic for acute dissection or esophageal perforation.  No evidence of volume overload on exam concerning for heart failure.  Reported improvement with GI cocktail.  Final Clinical Impression(s) / ED Diagnoses Final diagnoses:  SOB (shortness of breath)  Chest discomfort    The patient appears reasonably screened and/or stabilized for discharge and I doubt any other medical condition or other [facility] requiring further screening, evaluation, or treatment in the ED at this time. I have discussed the findings, Dx and Tx plan with the patient/family who expressed understanding and agree(s) with the plan. Discharge instructions discussed at length. The patient/family was given strict return precautions who verbalized understanding of the instructions. No further questions at time of discharge.  Disposition: Discharge  Condition: Good  ED Discharge Orders          Ordered    Ambulatory referral to Cardiology       Comments: If you have not heard from the Cardiology office within the next 72 hours please call [phone].   04/26/24 9362             Follow Up: Levora Reyes SAUNDERS, MD [address] [place] [address] [phone]  Call  to schedule an appointment for close follow up     Past Medical History Past Medical History:  Diagnosis Date   Anxiety    Fatty liver    Gallstone    IBS (irritable bowel syndrome)    Patient Active Problem List   Diagnosis Date Noted   OSA on CPAP 04/04/2023   Insomnia 04/04/2023   Hypertension, essential 08/07/2022   Acute prostatitis 06/22/2022   IBS (irritable bowel syndrome)    Home Medication(s) Prior to Admission medications  Medication Sig Start Date End Date Taking? Authorizing Provider  amLODipine  (NORVASC ) 5 MG tablet TAKE 1 TABLET(5 MG) BY MOUTH DAILY 01/12/24   Levora Reyes SAUNDERS, MD  busPIRone  (BUSPAR ) 7.5 MG tablet Take 1 tablet (7.5 mg total) by mouth 2 (two) times daily. Start once per day initial week and increase to BID  if tolerated. 12/06/23   Levora Reyes SAUNDERS, MD  eszopiclone  (LUNESTA ) 1 MG TABS tablet Take 1 tablet (1 mg total) by mouth at bedtime as needed for sleep. Take immediately before bedtime 11/16/23   Olalere, Adewale A, MD  omeprazole  (PRILOSEC) 40 MG capsule TAKE 1  CAPSULE(40 MG) BY MOUTH DAILY 01/03/24   McMichael, Bayley M, PA-C                                                                                                                                    Allergies Benzonatate   Review of Systems Review of Systems  Respiratory:  Positive for shortness of breath.    As noted in HPI  Physical Exam Vital Signs  I have reviewed the triage vital signs BP (!) 154/111   Pulse 65   Temp 97.8 F (36.6 C) (Oral)   Resp 18   Wt 108.9 kg   SpO2 97%   BMI 31.66 kg/m   Physical Exam Vitals reviewed.  Constitutional:      General: He is not in acute distress.    Appearance: He is well-developed. He is not diaphoretic.  HENT:     Head: Normocephalic and atraumatic.     Nose: Nose normal.  Eyes:     General: No scleral icterus.       Right eye: No discharge.        Left eye: No discharge.     Conjunctiva/sclera: Conjunctivae normal.     Pupils: Pupils are equal, round, and reactive to light.  Cardiovascular:     Rate and Rhythm: Normal rate and regular rhythm.     Heart sounds: No murmur heard.    No friction rub. No gallop.  Pulmonary:     Effort: Pulmonary effort is normal. No respiratory distress.     Breath sounds: Normal breath sounds. No stridor. No rales.  Chest:     Chest wall: No tenderness.  Abdominal:     General: There is no distension.     Palpations: Abdomen is soft.     Tenderness: There is no abdominal tenderness. There is no guarding or rebound.  Musculoskeletal:        General: No tenderness.     Cervical back: Normal range of motion and neck supple.     Right lower leg: No edema.     Left lower leg: No edema.  Skin:    General: Skin is warm and dry.     Findings: No erythema or rash.  Neurological:     Mental Status: He is alert and oriented to person, place, and time.     ED Results and Treatments Labs (all labs ordered are listed, but only abnormal results are displayed) Labs Reviewed  BASIC METABOLIC  PANEL WITH GFR - Abnormal; Notable for the following components:      Result Value   Glucose, Bld 142 (*)    Creatinine, Ser  1.29 (*)    All other components within normal limits  CBC - Abnormal; Notable for the following components:   RBC 4.20 (*)    Hemoglobin 12.8 (*)    HCT 37.1 (*)    All other components within normal limits  TROPONIN T, HIGH SENSITIVITY  TROPONIN T, HIGH SENSITIVITY                                                                                                                         EKG  EKG Interpretation Date/Time:  Thursday April 25 2024 23:05:15 EST Ventricular Rate:  67 PR Interval:  158 QRS Duration:  84 QT Interval:  382 QTC Calculation: 403 R Axis:   75  Text Interpretation: Normal sinus rhythm Nonspecific T wave abnormality Abnormal ECG When compared with ECG of 17-Jul-2008 16:13, No significant change was found Confirmed by Trine Likes [phone]) on 04/25/2024 11:14:04 PM       Radiology DG Chest 2 View Result Date: 04/25/2024 CLINICAL DATA:  Short of breath EXAM: CHEST - 2 VIEW COMPARISON:  12/05/2016 FINDINGS: The heart size and mediastinal contours are within normal limits. Both lungs are clear. The visualized skeletal structures are unremarkable. IMPRESSION: No active cardiopulmonary disease. Electronically Signed   By: Luke Bun M.D.   On: 04/25/2024 23:22    Medications Ordered in ED Medications  alum & mag hydroxide-simeth (MAALOX/MYLANTA) 200-200-20 MG/5ML suspension 30 mL (30 mLs Oral Given 04/26/24 0419)    And  lidocaine  (XYLOCAINE ) 2 % viscous mouth solution 15 mL (15 mLs Oral Given 04/26/24 0419)   Procedures Procedures  (including critical care time)   This chart was dictated using voice recognition software.  Despite best efforts to proofread,  errors can occur which can change the documentation meaning.   Trine Likes Moder, MD 04/26/24 [phone]  "

## 2024-05-20 NOTE — Telephone Encounter (Signed)
 Spoke to patient to remind him that he is due for his 2nd/final Havrix (Hep A) injection. Patient has scheduled this injection to be completed at our office on 05/31/24 at 2 pm.

## 2024-05-24 ENCOUNTER — Ambulatory Visit (HOSPITAL_BASED_OUTPATIENT_CLINIC_OR_DEPARTMENT_OTHER): Admitting: Cardiology

## 2024-05-31 ENCOUNTER — Ambulatory Visit

## 2024-06-28 ENCOUNTER — Ambulatory Visit (HOSPITAL_BASED_OUTPATIENT_CLINIC_OR_DEPARTMENT_OTHER): Admitting: Cardiology
